# Patient Record
Sex: Female | Born: 1935 | Race: Black or African American | Hispanic: No | Marital: Single | Smoking: Never smoker
Health system: Southern US, Community
[De-identification: ages and names within clinical notes are randomized; demographics above are authoritative.]

## PROBLEM LIST (undated history)

## (undated) DIAGNOSIS — F0390 Unspecified dementia without behavioral disturbance: Secondary | ICD-10-CM

## (undated) DIAGNOSIS — T7840XA Allergy, unspecified, initial encounter: Secondary | ICD-10-CM

## (undated) DIAGNOSIS — E538 Deficiency of other specified B group vitamins: Secondary | ICD-10-CM

## (undated) DIAGNOSIS — H409 Unspecified glaucoma: Secondary | ICD-10-CM

## (undated) DIAGNOSIS — Z8489 Family history of other specified conditions: Secondary | ICD-10-CM

## (undated) DIAGNOSIS — M1990 Unspecified osteoarthritis, unspecified site: Secondary | ICD-10-CM

## (undated) DIAGNOSIS — T883XXA Malignant hyperthermia due to anesthesia, initial encounter: Secondary | ICD-10-CM

## (undated) DIAGNOSIS — I10 Essential (primary) hypertension: Secondary | ICD-10-CM

## (undated) DIAGNOSIS — Z8619 Personal history of other infectious and parasitic diseases: Secondary | ICD-10-CM

## (undated) DIAGNOSIS — Z87898 Personal history of other specified conditions: Secondary | ICD-10-CM

## (undated) DIAGNOSIS — J189 Pneumonia, unspecified organism: Secondary | ICD-10-CM

## (undated) DIAGNOSIS — K5909 Other constipation: Secondary | ICD-10-CM

## (undated) DIAGNOSIS — D649 Anemia, unspecified: Secondary | ICD-10-CM

## (undated) DIAGNOSIS — G459 Transient cerebral ischemic attack, unspecified: Secondary | ICD-10-CM

## (undated) DIAGNOSIS — H6120 Impacted cerumen, unspecified ear: Secondary | ICD-10-CM

## (undated) DIAGNOSIS — Z8781 Personal history of (healed) traumatic fracture: Secondary | ICD-10-CM

## (undated) DIAGNOSIS — B0229 Other postherpetic nervous system involvement: Secondary | ICD-10-CM

## (undated) HISTORY — DX: Personal history of other specified conditions: Z87.898

## (undated) HISTORY — DX: Allergy, unspecified, initial encounter: T78.40XA

## (undated) HISTORY — DX: Unspecified dementia, unspecified severity, without behavioral disturbance, psychotic disturbance, mood disturbance, and anxiety: F03.90

## (undated) HISTORY — DX: Impacted cerumen, unspecified ear: H61.20

## (undated) HISTORY — PX: BREAST LUMPECTOMY: SHX2

## (undated) HISTORY — DX: Personal history of (healed) traumatic fracture: Z87.81

## (undated) HISTORY — DX: Deficiency of other specified B group vitamins: E53.8

## (undated) HISTORY — DX: Unspecified osteoarthritis, unspecified site: M19.90

## (undated) HISTORY — DX: Other constipation: K59.09

## (undated) HISTORY — DX: Anemia, unspecified: D64.9

## (undated) HISTORY — DX: Essential (primary) hypertension: I10

## (undated) HISTORY — DX: Other postherpetic nervous system involvement: B02.29

## (undated) HISTORY — DX: Personal history of other infectious and parasitic diseases: Z86.19

## (undated) HISTORY — DX: Transient cerebral ischemic attack, unspecified: G45.9

## (undated) HISTORY — DX: Unspecified glaucoma: H40.9

---

## 2010-02-25 ENCOUNTER — Ambulatory Visit: Payer: Self-pay | Admitting: Internal Medicine

## 2010-05-22 ENCOUNTER — Ambulatory Visit: Payer: Self-pay | Admitting: Internal Medicine

## 2011-01-06 ENCOUNTER — Ambulatory Visit: Payer: Self-pay | Admitting: Anesthesiology

## 2011-08-22 DIAGNOSIS — I10 Essential (primary) hypertension: Secondary | ICD-10-CM | POA: Diagnosis not present

## 2011-08-22 DIAGNOSIS — M159 Polyosteoarthritis, unspecified: Secondary | ICD-10-CM | POA: Diagnosis not present

## 2011-08-22 DIAGNOSIS — D510 Vitamin B12 deficiency anemia due to intrinsic factor deficiency: Secondary | ICD-10-CM | POA: Diagnosis not present

## 2011-09-17 DIAGNOSIS — I10 Essential (primary) hypertension: Secondary | ICD-10-CM | POA: Diagnosis not present

## 2011-09-17 DIAGNOSIS — M159 Polyosteoarthritis, unspecified: Secondary | ICD-10-CM | POA: Diagnosis not present

## 2011-09-17 DIAGNOSIS — D510 Vitamin B12 deficiency anemia due to intrinsic factor deficiency: Secondary | ICD-10-CM | POA: Diagnosis not present

## 2011-09-22 DIAGNOSIS — M79609 Pain in unspecified limb: Secondary | ICD-10-CM | POA: Diagnosis not present

## 2011-09-22 DIAGNOSIS — B351 Tinea unguium: Secondary | ICD-10-CM | POA: Diagnosis not present

## 2011-10-16 DIAGNOSIS — D510 Vitamin B12 deficiency anemia due to intrinsic factor deficiency: Secondary | ICD-10-CM | POA: Diagnosis not present

## 2011-10-16 DIAGNOSIS — I10 Essential (primary) hypertension: Secondary | ICD-10-CM | POA: Diagnosis not present

## 2011-10-16 DIAGNOSIS — M159 Polyosteoarthritis, unspecified: Secondary | ICD-10-CM | POA: Diagnosis not present

## 2011-10-21 DIAGNOSIS — M159 Polyosteoarthritis, unspecified: Secondary | ICD-10-CM | POA: Diagnosis not present

## 2011-10-21 DIAGNOSIS — I10 Essential (primary) hypertension: Secondary | ICD-10-CM | POA: Diagnosis not present

## 2011-10-21 DIAGNOSIS — G589 Mononeuropathy, unspecified: Secondary | ICD-10-CM | POA: Diagnosis not present

## 2011-10-21 DIAGNOSIS — D510 Vitamin B12 deficiency anemia due to intrinsic factor deficiency: Secondary | ICD-10-CM | POA: Diagnosis not present

## 2011-11-11 DIAGNOSIS — F329 Major depressive disorder, single episode, unspecified: Secondary | ICD-10-CM | POA: Diagnosis not present

## 2011-11-11 DIAGNOSIS — I10 Essential (primary) hypertension: Secondary | ICD-10-CM | POA: Diagnosis not present

## 2011-11-11 DIAGNOSIS — M25569 Pain in unspecified knee: Secondary | ICD-10-CM | POA: Diagnosis not present

## 2011-11-11 DIAGNOSIS — M159 Polyosteoarthritis, unspecified: Secondary | ICD-10-CM | POA: Diagnosis not present

## 2011-11-11 DIAGNOSIS — F0390 Unspecified dementia without behavioral disturbance: Secondary | ICD-10-CM | POA: Diagnosis not present

## 2011-11-11 DIAGNOSIS — D518 Other vitamin B12 deficiency anemias: Secondary | ICD-10-CM | POA: Diagnosis not present

## 2011-11-12 DIAGNOSIS — G589 Mononeuropathy, unspecified: Secondary | ICD-10-CM | POA: Diagnosis not present

## 2011-11-12 DIAGNOSIS — D510 Vitamin B12 deficiency anemia due to intrinsic factor deficiency: Secondary | ICD-10-CM | POA: Diagnosis not present

## 2011-11-12 DIAGNOSIS — M159 Polyosteoarthritis, unspecified: Secondary | ICD-10-CM | POA: Diagnosis not present

## 2011-11-12 DIAGNOSIS — I10 Essential (primary) hypertension: Secondary | ICD-10-CM | POA: Diagnosis not present

## 2011-11-16 DIAGNOSIS — F0280 Dementia in other diseases classified elsewhere without behavioral disturbance: Secondary | ICD-10-CM | POA: Diagnosis not present

## 2011-11-16 DIAGNOSIS — M79609 Pain in unspecified limb: Secondary | ICD-10-CM | POA: Diagnosis not present

## 2011-11-16 DIAGNOSIS — G309 Alzheimer's disease, unspecified: Secondary | ICD-10-CM | POA: Diagnosis not present

## 2011-11-16 DIAGNOSIS — M25569 Pain in unspecified knee: Secondary | ICD-10-CM | POA: Diagnosis not present

## 2011-11-16 DIAGNOSIS — M1710 Unilateral primary osteoarthritis, unspecified knee: Secondary | ICD-10-CM | POA: Diagnosis not present

## 2011-11-16 DIAGNOSIS — F329 Major depressive disorder, single episode, unspecified: Secondary | ICD-10-CM | POA: Diagnosis not present

## 2011-12-15 DIAGNOSIS — G589 Mononeuropathy, unspecified: Secondary | ICD-10-CM | POA: Diagnosis not present

## 2011-12-15 DIAGNOSIS — D510 Vitamin B12 deficiency anemia due to intrinsic factor deficiency: Secondary | ICD-10-CM | POA: Diagnosis not present

## 2011-12-15 DIAGNOSIS — I10 Essential (primary) hypertension: Secondary | ICD-10-CM | POA: Diagnosis not present

## 2011-12-15 DIAGNOSIS — M159 Polyosteoarthritis, unspecified: Secondary | ICD-10-CM | POA: Diagnosis not present

## 2011-12-20 DIAGNOSIS — M159 Polyosteoarthritis, unspecified: Secondary | ICD-10-CM | POA: Diagnosis not present

## 2011-12-20 DIAGNOSIS — G608 Other hereditary and idiopathic neuropathies: Secondary | ICD-10-CM | POA: Diagnosis not present

## 2011-12-20 DIAGNOSIS — I10 Essential (primary) hypertension: Secondary | ICD-10-CM | POA: Diagnosis not present

## 2011-12-20 DIAGNOSIS — D510 Vitamin B12 deficiency anemia due to intrinsic factor deficiency: Secondary | ICD-10-CM | POA: Diagnosis not present

## 2011-12-20 DIAGNOSIS — R32 Unspecified urinary incontinence: Secondary | ICD-10-CM | POA: Diagnosis not present

## 2012-01-11 DIAGNOSIS — M79609 Pain in unspecified limb: Secondary | ICD-10-CM | POA: Diagnosis not present

## 2012-01-11 DIAGNOSIS — F329 Major depressive disorder, single episode, unspecified: Secondary | ICD-10-CM | POA: Diagnosis not present

## 2012-01-11 DIAGNOSIS — M1710 Unilateral primary osteoarthritis, unspecified knee: Secondary | ICD-10-CM | POA: Diagnosis not present

## 2012-01-11 DIAGNOSIS — Z79899 Other long term (current) drug therapy: Secondary | ICD-10-CM | POA: Diagnosis not present

## 2012-01-11 DIAGNOSIS — F0280 Dementia in other diseases classified elsewhere without behavioral disturbance: Secondary | ICD-10-CM | POA: Diagnosis not present

## 2012-01-11 DIAGNOSIS — M25569 Pain in unspecified knee: Secondary | ICD-10-CM | POA: Diagnosis not present

## 2012-01-15 DIAGNOSIS — D510 Vitamin B12 deficiency anemia due to intrinsic factor deficiency: Secondary | ICD-10-CM | POA: Diagnosis not present

## 2012-01-15 DIAGNOSIS — M159 Polyosteoarthritis, unspecified: Secondary | ICD-10-CM | POA: Diagnosis not present

## 2012-01-15 DIAGNOSIS — R32 Unspecified urinary incontinence: Secondary | ICD-10-CM | POA: Diagnosis not present

## 2012-01-15 DIAGNOSIS — I10 Essential (primary) hypertension: Secondary | ICD-10-CM | POA: Diagnosis not present

## 2012-01-15 DIAGNOSIS — G608 Other hereditary and idiopathic neuropathies: Secondary | ICD-10-CM | POA: Diagnosis not present

## 2012-01-28 DIAGNOSIS — H2510 Age-related nuclear cataract, unspecified eye: Secondary | ICD-10-CM | POA: Diagnosis not present

## 2012-01-28 DIAGNOSIS — H40029 Open angle with borderline findings, high risk, unspecified eye: Secondary | ICD-10-CM | POA: Diagnosis not present

## 2012-02-11 ENCOUNTER — Emergency Department: Payer: Self-pay | Admitting: Emergency Medicine

## 2012-02-11 DIAGNOSIS — S62109A Fracture of unspecified carpal bone, unspecified wrist, initial encounter for closed fracture: Secondary | ICD-10-CM | POA: Diagnosis not present

## 2012-02-11 DIAGNOSIS — S52599A Other fractures of lower end of unspecified radius, initial encounter for closed fracture: Secondary | ICD-10-CM | POA: Diagnosis not present

## 2012-02-11 DIAGNOSIS — S52509A Unspecified fracture of the lower end of unspecified radius, initial encounter for closed fracture: Secondary | ICD-10-CM | POA: Diagnosis not present

## 2012-02-11 DIAGNOSIS — S52609A Unspecified fracture of lower end of unspecified ulna, initial encounter for closed fracture: Secondary | ICD-10-CM | POA: Diagnosis not present

## 2012-02-11 DIAGNOSIS — I10 Essential (primary) hypertension: Secondary | ICD-10-CM | POA: Diagnosis not present

## 2012-02-12 DIAGNOSIS — S52539A Colles' fracture of unspecified radius, initial encounter for closed fracture: Secondary | ICD-10-CM | POA: Diagnosis not present

## 2012-02-12 LAB — CBC WITH DIFFERENTIAL/PLATELET
Basophil #: 0 10*3/uL (ref 0.0–0.1)
Eosinophil #: 0.1 10*3/uL (ref 0.0–0.7)
HCT: 32.5 % — ABNORMAL LOW (ref 35.0–47.0)
HGB: 10.2 g/dL — ABNORMAL LOW (ref 12.0–16.0)
Lymphocyte #: 2.1 10*3/uL (ref 1.0–3.6)
Lymphocyte %: 21 %
MCHC: 31.2 g/dL — ABNORMAL LOW (ref 32.0–36.0)
Monocyte #: 0.9 x10 3/mm (ref 0.2–0.9)
Monocyte %: 9.4 %
Neutrophil #: 6.7 10*3/uL — ABNORMAL HIGH (ref 1.4–6.5)
Neutrophil %: 67.7 %
Platelet: 287 10*3/uL (ref 150–440)
RBC: 3.59 10*6/uL — ABNORMAL LOW (ref 3.80–5.20)
WBC: 9.8 10*3/uL (ref 3.6–11.0)

## 2012-02-12 LAB — COMPREHENSIVE METABOLIC PANEL
Albumin: 3.9 g/dL (ref 3.4–5.0)
Alkaline Phosphatase: 58 U/L (ref 50–136)
BUN: 45 mg/dL — ABNORMAL HIGH (ref 7–18)
Bilirubin,Total: 0.3 mg/dL (ref 0.2–1.0)
Chloride: 100 mmol/L (ref 98–107)
Co2: 29 mmol/L (ref 21–32)
EGFR (African American): 47 — ABNORMAL LOW
EGFR (Non-African Amer.): 41 — ABNORMAL LOW
Osmolality: 286 (ref 275–301)
Potassium: 4 mmol/L (ref 3.5–5.1)
SGOT(AST): 28 U/L (ref 15–37)

## 2012-02-12 LAB — TROPONIN I: Troponin-I: <0.02 ng/mL

## 2012-02-12 LAB — TSH: Thyroid Stimulating Horm: 2.37 u[IU]/mL

## 2012-02-15 DIAGNOSIS — G608 Other hereditary and idiopathic neuropathies: Secondary | ICD-10-CM | POA: Diagnosis not present

## 2012-02-15 DIAGNOSIS — R32 Unspecified urinary incontinence: Secondary | ICD-10-CM | POA: Diagnosis not present

## 2012-02-15 DIAGNOSIS — I10 Essential (primary) hypertension: Secondary | ICD-10-CM | POA: Diagnosis not present

## 2012-02-15 DIAGNOSIS — D510 Vitamin B12 deficiency anemia due to intrinsic factor deficiency: Secondary | ICD-10-CM | POA: Diagnosis not present

## 2012-02-15 DIAGNOSIS — M159 Polyosteoarthritis, unspecified: Secondary | ICD-10-CM | POA: Diagnosis not present

## 2012-02-18 DIAGNOSIS — I10 Essential (primary) hypertension: Secondary | ICD-10-CM | POA: Diagnosis not present

## 2012-02-18 DIAGNOSIS — M6281 Muscle weakness (generalized): Secondary | ICD-10-CM | POA: Diagnosis not present

## 2012-02-18 DIAGNOSIS — R32 Unspecified urinary incontinence: Secondary | ICD-10-CM | POA: Diagnosis not present

## 2012-02-18 DIAGNOSIS — R269 Unspecified abnormalities of gait and mobility: Secondary | ICD-10-CM | POA: Diagnosis not present

## 2012-02-18 DIAGNOSIS — F0390 Unspecified dementia without behavioral disturbance: Secondary | ICD-10-CM | POA: Diagnosis not present

## 2012-02-18 DIAGNOSIS — Z9181 History of falling: Secondary | ICD-10-CM | POA: Diagnosis not present

## 2012-02-18 DIAGNOSIS — M159 Polyosteoarthritis, unspecified: Secondary | ICD-10-CM | POA: Diagnosis not present

## 2012-02-18 DIAGNOSIS — S5290XD Unspecified fracture of unspecified forearm, subsequent encounter for closed fracture with routine healing: Secondary | ICD-10-CM | POA: Diagnosis not present

## 2012-02-18 DIAGNOSIS — R262 Difficulty in walking, not elsewhere classified: Secondary | ICD-10-CM | POA: Diagnosis not present

## 2012-02-18 DIAGNOSIS — D510 Vitamin B12 deficiency anemia due to intrinsic factor deficiency: Secondary | ICD-10-CM | POA: Diagnosis not present

## 2012-02-18 DIAGNOSIS — G608 Other hereditary and idiopathic neuropathies: Secondary | ICD-10-CM | POA: Diagnosis not present

## 2012-02-19 DIAGNOSIS — I10 Essential (primary) hypertension: Secondary | ICD-10-CM | POA: Diagnosis not present

## 2012-02-19 DIAGNOSIS — S5290XA Unspecified fracture of unspecified forearm, initial encounter for closed fracture: Secondary | ICD-10-CM | POA: Diagnosis not present

## 2012-02-19 DIAGNOSIS — M1710 Unilateral primary osteoarthritis, unspecified knee: Secondary | ICD-10-CM | POA: Diagnosis not present

## 2012-02-19 DIAGNOSIS — F0390 Unspecified dementia without behavioral disturbance: Secondary | ICD-10-CM | POA: Diagnosis not present

## 2012-02-22 DIAGNOSIS — M159 Polyosteoarthritis, unspecified: Secondary | ICD-10-CM | POA: Diagnosis not present

## 2012-02-22 DIAGNOSIS — I10 Essential (primary) hypertension: Secondary | ICD-10-CM | POA: Diagnosis not present

## 2012-02-22 DIAGNOSIS — F0390 Unspecified dementia without behavioral disturbance: Secondary | ICD-10-CM | POA: Diagnosis not present

## 2012-02-22 DIAGNOSIS — S5290XD Unspecified fracture of unspecified forearm, subsequent encounter for closed fracture with routine healing: Secondary | ICD-10-CM | POA: Diagnosis not present

## 2012-02-22 DIAGNOSIS — D510 Vitamin B12 deficiency anemia due to intrinsic factor deficiency: Secondary | ICD-10-CM | POA: Diagnosis not present

## 2012-02-22 DIAGNOSIS — R262 Difficulty in walking, not elsewhere classified: Secondary | ICD-10-CM | POA: Diagnosis not present

## 2012-02-24 DIAGNOSIS — I10 Essential (primary) hypertension: Secondary | ICD-10-CM | POA: Diagnosis not present

## 2012-02-24 DIAGNOSIS — S5290XD Unspecified fracture of unspecified forearm, subsequent encounter for closed fracture with routine healing: Secondary | ICD-10-CM | POA: Diagnosis not present

## 2012-02-24 DIAGNOSIS — F0390 Unspecified dementia without behavioral disturbance: Secondary | ICD-10-CM | POA: Diagnosis not present

## 2012-02-24 DIAGNOSIS — R262 Difficulty in walking, not elsewhere classified: Secondary | ICD-10-CM | POA: Diagnosis not present

## 2012-02-24 DIAGNOSIS — D510 Vitamin B12 deficiency anemia due to intrinsic factor deficiency: Secondary | ICD-10-CM | POA: Diagnosis not present

## 2012-02-24 DIAGNOSIS — M159 Polyosteoarthritis, unspecified: Secondary | ICD-10-CM | POA: Diagnosis not present

## 2012-02-25 DIAGNOSIS — I10 Essential (primary) hypertension: Secondary | ICD-10-CM | POA: Diagnosis not present

## 2012-02-25 DIAGNOSIS — D510 Vitamin B12 deficiency anemia due to intrinsic factor deficiency: Secondary | ICD-10-CM | POA: Diagnosis not present

## 2012-02-25 DIAGNOSIS — M159 Polyosteoarthritis, unspecified: Secondary | ICD-10-CM | POA: Diagnosis not present

## 2012-02-25 DIAGNOSIS — S5290XD Unspecified fracture of unspecified forearm, subsequent encounter for closed fracture with routine healing: Secondary | ICD-10-CM | POA: Diagnosis not present

## 2012-02-25 DIAGNOSIS — F0390 Unspecified dementia without behavioral disturbance: Secondary | ICD-10-CM | POA: Diagnosis not present

## 2012-02-25 DIAGNOSIS — R262 Difficulty in walking, not elsewhere classified: Secondary | ICD-10-CM | POA: Diagnosis not present

## 2012-02-29 DIAGNOSIS — I10 Essential (primary) hypertension: Secondary | ICD-10-CM | POA: Diagnosis not present

## 2012-02-29 DIAGNOSIS — D510 Vitamin B12 deficiency anemia due to intrinsic factor deficiency: Secondary | ICD-10-CM | POA: Diagnosis not present

## 2012-02-29 DIAGNOSIS — S5290XD Unspecified fracture of unspecified forearm, subsequent encounter for closed fracture with routine healing: Secondary | ICD-10-CM | POA: Diagnosis not present

## 2012-02-29 DIAGNOSIS — M159 Polyosteoarthritis, unspecified: Secondary | ICD-10-CM | POA: Diagnosis not present

## 2012-02-29 DIAGNOSIS — F0390 Unspecified dementia without behavioral disturbance: Secondary | ICD-10-CM | POA: Diagnosis not present

## 2012-02-29 DIAGNOSIS — R262 Difficulty in walking, not elsewhere classified: Secondary | ICD-10-CM | POA: Diagnosis not present

## 2012-03-01 DIAGNOSIS — S52539A Colles' fracture of unspecified radius, initial encounter for closed fracture: Secondary | ICD-10-CM | POA: Diagnosis not present

## 2012-03-01 DIAGNOSIS — M79609 Pain in unspecified limb: Secondary | ICD-10-CM | POA: Diagnosis not present

## 2012-03-01 DIAGNOSIS — B351 Tinea unguium: Secondary | ICD-10-CM | POA: Diagnosis not present

## 2012-03-02 DIAGNOSIS — M159 Polyosteoarthritis, unspecified: Secondary | ICD-10-CM | POA: Diagnosis not present

## 2012-03-02 DIAGNOSIS — D510 Vitamin B12 deficiency anemia due to intrinsic factor deficiency: Secondary | ICD-10-CM | POA: Diagnosis not present

## 2012-03-02 DIAGNOSIS — S5290XD Unspecified fracture of unspecified forearm, subsequent encounter for closed fracture with routine healing: Secondary | ICD-10-CM | POA: Diagnosis not present

## 2012-03-02 DIAGNOSIS — I10 Essential (primary) hypertension: Secondary | ICD-10-CM | POA: Diagnosis not present

## 2012-03-02 DIAGNOSIS — F0390 Unspecified dementia without behavioral disturbance: Secondary | ICD-10-CM | POA: Diagnosis not present

## 2012-03-02 DIAGNOSIS — R262 Difficulty in walking, not elsewhere classified: Secondary | ICD-10-CM | POA: Diagnosis not present

## 2012-03-04 DIAGNOSIS — D510 Vitamin B12 deficiency anemia due to intrinsic factor deficiency: Secondary | ICD-10-CM | POA: Diagnosis not present

## 2012-03-04 DIAGNOSIS — I10 Essential (primary) hypertension: Secondary | ICD-10-CM | POA: Diagnosis not present

## 2012-03-04 DIAGNOSIS — F0390 Unspecified dementia without behavioral disturbance: Secondary | ICD-10-CM | POA: Diagnosis not present

## 2012-03-04 DIAGNOSIS — R262 Difficulty in walking, not elsewhere classified: Secondary | ICD-10-CM | POA: Diagnosis not present

## 2012-03-04 DIAGNOSIS — M159 Polyosteoarthritis, unspecified: Secondary | ICD-10-CM | POA: Diagnosis not present

## 2012-03-04 DIAGNOSIS — S5290XD Unspecified fracture of unspecified forearm, subsequent encounter for closed fracture with routine healing: Secondary | ICD-10-CM | POA: Diagnosis not present

## 2012-03-07 DIAGNOSIS — M159 Polyosteoarthritis, unspecified: Secondary | ICD-10-CM | POA: Diagnosis not present

## 2012-03-07 DIAGNOSIS — F0390 Unspecified dementia without behavioral disturbance: Secondary | ICD-10-CM | POA: Diagnosis not present

## 2012-03-07 DIAGNOSIS — R262 Difficulty in walking, not elsewhere classified: Secondary | ICD-10-CM | POA: Diagnosis not present

## 2012-03-07 DIAGNOSIS — S5290XD Unspecified fracture of unspecified forearm, subsequent encounter for closed fracture with routine healing: Secondary | ICD-10-CM | POA: Diagnosis not present

## 2012-03-07 DIAGNOSIS — D510 Vitamin B12 deficiency anemia due to intrinsic factor deficiency: Secondary | ICD-10-CM | POA: Diagnosis not present

## 2012-03-07 DIAGNOSIS — I10 Essential (primary) hypertension: Secondary | ICD-10-CM | POA: Diagnosis not present

## 2012-03-08 DIAGNOSIS — H40029 Open angle with borderline findings, high risk, unspecified eye: Secondary | ICD-10-CM | POA: Diagnosis not present

## 2012-03-09 DIAGNOSIS — I10 Essential (primary) hypertension: Secondary | ICD-10-CM | POA: Diagnosis not present

## 2012-03-09 DIAGNOSIS — F0390 Unspecified dementia without behavioral disturbance: Secondary | ICD-10-CM | POA: Diagnosis not present

## 2012-03-09 DIAGNOSIS — D510 Vitamin B12 deficiency anemia due to intrinsic factor deficiency: Secondary | ICD-10-CM | POA: Diagnosis not present

## 2012-03-09 DIAGNOSIS — S5290XD Unspecified fracture of unspecified forearm, subsequent encounter for closed fracture with routine healing: Secondary | ICD-10-CM | POA: Diagnosis not present

## 2012-03-09 DIAGNOSIS — M159 Polyosteoarthritis, unspecified: Secondary | ICD-10-CM | POA: Diagnosis not present

## 2012-03-09 DIAGNOSIS — R262 Difficulty in walking, not elsewhere classified: Secondary | ICD-10-CM | POA: Diagnosis not present

## 2012-03-10 DIAGNOSIS — M159 Polyosteoarthritis, unspecified: Secondary | ICD-10-CM | POA: Diagnosis not present

## 2012-03-10 DIAGNOSIS — F0390 Unspecified dementia without behavioral disturbance: Secondary | ICD-10-CM | POA: Diagnosis not present

## 2012-03-10 DIAGNOSIS — D510 Vitamin B12 deficiency anemia due to intrinsic factor deficiency: Secondary | ICD-10-CM | POA: Diagnosis not present

## 2012-03-10 DIAGNOSIS — I10 Essential (primary) hypertension: Secondary | ICD-10-CM | POA: Diagnosis not present

## 2012-03-10 DIAGNOSIS — S5290XD Unspecified fracture of unspecified forearm, subsequent encounter for closed fracture with routine healing: Secondary | ICD-10-CM | POA: Diagnosis not present

## 2012-03-10 DIAGNOSIS — R262 Difficulty in walking, not elsewhere classified: Secondary | ICD-10-CM | POA: Diagnosis not present

## 2012-03-14 DIAGNOSIS — F0390 Unspecified dementia without behavioral disturbance: Secondary | ICD-10-CM | POA: Diagnosis not present

## 2012-03-14 DIAGNOSIS — D510 Vitamin B12 deficiency anemia due to intrinsic factor deficiency: Secondary | ICD-10-CM | POA: Diagnosis not present

## 2012-03-14 DIAGNOSIS — S5290XD Unspecified fracture of unspecified forearm, subsequent encounter for closed fracture with routine healing: Secondary | ICD-10-CM | POA: Diagnosis not present

## 2012-03-14 DIAGNOSIS — M159 Polyosteoarthritis, unspecified: Secondary | ICD-10-CM | POA: Diagnosis not present

## 2012-03-14 DIAGNOSIS — I10 Essential (primary) hypertension: Secondary | ICD-10-CM | POA: Diagnosis not present

## 2012-03-14 DIAGNOSIS — R262 Difficulty in walking, not elsewhere classified: Secondary | ICD-10-CM | POA: Diagnosis not present

## 2012-03-15 DIAGNOSIS — S5290XD Unspecified fracture of unspecified forearm, subsequent encounter for closed fracture with routine healing: Secondary | ICD-10-CM | POA: Diagnosis not present

## 2012-03-15 DIAGNOSIS — R262 Difficulty in walking, not elsewhere classified: Secondary | ICD-10-CM | POA: Diagnosis not present

## 2012-03-15 DIAGNOSIS — M159 Polyosteoarthritis, unspecified: Secondary | ICD-10-CM | POA: Diagnosis not present

## 2012-03-15 DIAGNOSIS — D510 Vitamin B12 deficiency anemia due to intrinsic factor deficiency: Secondary | ICD-10-CM | POA: Diagnosis not present

## 2012-03-15 DIAGNOSIS — F0390 Unspecified dementia without behavioral disturbance: Secondary | ICD-10-CM | POA: Diagnosis not present

## 2012-03-15 DIAGNOSIS — I10 Essential (primary) hypertension: Secondary | ICD-10-CM | POA: Diagnosis not present

## 2012-03-17 DIAGNOSIS — M159 Polyosteoarthritis, unspecified: Secondary | ICD-10-CM | POA: Diagnosis not present

## 2012-03-17 DIAGNOSIS — R262 Difficulty in walking, not elsewhere classified: Secondary | ICD-10-CM | POA: Diagnosis not present

## 2012-03-17 DIAGNOSIS — F0390 Unspecified dementia without behavioral disturbance: Secondary | ICD-10-CM | POA: Diagnosis not present

## 2012-03-17 DIAGNOSIS — I10 Essential (primary) hypertension: Secondary | ICD-10-CM | POA: Diagnosis not present

## 2012-03-17 DIAGNOSIS — S5290XD Unspecified fracture of unspecified forearm, subsequent encounter for closed fracture with routine healing: Secondary | ICD-10-CM | POA: Diagnosis not present

## 2012-03-17 DIAGNOSIS — D510 Vitamin B12 deficiency anemia due to intrinsic factor deficiency: Secondary | ICD-10-CM | POA: Diagnosis not present

## 2012-03-22 DIAGNOSIS — R262 Difficulty in walking, not elsewhere classified: Secondary | ICD-10-CM | POA: Diagnosis not present

## 2012-03-22 DIAGNOSIS — D510 Vitamin B12 deficiency anemia due to intrinsic factor deficiency: Secondary | ICD-10-CM | POA: Diagnosis not present

## 2012-03-22 DIAGNOSIS — I10 Essential (primary) hypertension: Secondary | ICD-10-CM | POA: Diagnosis not present

## 2012-03-22 DIAGNOSIS — F0390 Unspecified dementia without behavioral disturbance: Secondary | ICD-10-CM | POA: Diagnosis not present

## 2012-03-22 DIAGNOSIS — M159 Polyosteoarthritis, unspecified: Secondary | ICD-10-CM | POA: Diagnosis not present

## 2012-03-22 DIAGNOSIS — S5290XD Unspecified fracture of unspecified forearm, subsequent encounter for closed fracture with routine healing: Secondary | ICD-10-CM | POA: Diagnosis not present

## 2012-03-24 DIAGNOSIS — S52539A Colles' fracture of unspecified radius, initial encounter for closed fracture: Secondary | ICD-10-CM | POA: Diagnosis not present

## 2012-03-24 DIAGNOSIS — M159 Polyosteoarthritis, unspecified: Secondary | ICD-10-CM | POA: Diagnosis not present

## 2012-03-24 DIAGNOSIS — S5290XD Unspecified fracture of unspecified forearm, subsequent encounter for closed fracture with routine healing: Secondary | ICD-10-CM | POA: Diagnosis not present

## 2012-03-24 DIAGNOSIS — I10 Essential (primary) hypertension: Secondary | ICD-10-CM | POA: Diagnosis not present

## 2012-03-24 DIAGNOSIS — D510 Vitamin B12 deficiency anemia due to intrinsic factor deficiency: Secondary | ICD-10-CM | POA: Diagnosis not present

## 2012-03-24 DIAGNOSIS — R262 Difficulty in walking, not elsewhere classified: Secondary | ICD-10-CM | POA: Diagnosis not present

## 2012-03-24 DIAGNOSIS — F0390 Unspecified dementia without behavioral disturbance: Secondary | ICD-10-CM | POA: Diagnosis not present

## 2012-03-28 DIAGNOSIS — Z79899 Other long term (current) drug therapy: Secondary | ICD-10-CM | POA: Diagnosis not present

## 2012-03-28 DIAGNOSIS — F0280 Dementia in other diseases classified elsewhere without behavioral disturbance: Secondary | ICD-10-CM | POA: Diagnosis not present

## 2012-03-28 DIAGNOSIS — M25569 Pain in unspecified knee: Secondary | ICD-10-CM | POA: Diagnosis not present

## 2012-03-28 DIAGNOSIS — G309 Alzheimer's disease, unspecified: Secondary | ICD-10-CM | POA: Diagnosis not present

## 2012-03-28 DIAGNOSIS — F329 Major depressive disorder, single episode, unspecified: Secondary | ICD-10-CM | POA: Diagnosis not present

## 2012-03-28 DIAGNOSIS — M79609 Pain in unspecified limb: Secondary | ICD-10-CM | POA: Diagnosis not present

## 2012-03-28 DIAGNOSIS — M1710 Unilateral primary osteoarthritis, unspecified knee: Secondary | ICD-10-CM | POA: Diagnosis not present

## 2012-03-30 DIAGNOSIS — M159 Polyosteoarthritis, unspecified: Secondary | ICD-10-CM | POA: Diagnosis not present

## 2012-03-30 DIAGNOSIS — R262 Difficulty in walking, not elsewhere classified: Secondary | ICD-10-CM | POA: Diagnosis not present

## 2012-03-30 DIAGNOSIS — S5290XD Unspecified fracture of unspecified forearm, subsequent encounter for closed fracture with routine healing: Secondary | ICD-10-CM | POA: Diagnosis not present

## 2012-03-30 DIAGNOSIS — F0390 Unspecified dementia without behavioral disturbance: Secondary | ICD-10-CM | POA: Diagnosis not present

## 2012-03-30 DIAGNOSIS — I10 Essential (primary) hypertension: Secondary | ICD-10-CM | POA: Diagnosis not present

## 2012-03-30 DIAGNOSIS — D510 Vitamin B12 deficiency anemia due to intrinsic factor deficiency: Secondary | ICD-10-CM | POA: Diagnosis not present

## 2012-04-01 DIAGNOSIS — S5290XD Unspecified fracture of unspecified forearm, subsequent encounter for closed fracture with routine healing: Secondary | ICD-10-CM | POA: Diagnosis not present

## 2012-04-01 DIAGNOSIS — D510 Vitamin B12 deficiency anemia due to intrinsic factor deficiency: Secondary | ICD-10-CM | POA: Diagnosis not present

## 2012-04-01 DIAGNOSIS — M159 Polyosteoarthritis, unspecified: Secondary | ICD-10-CM | POA: Diagnosis not present

## 2012-04-01 DIAGNOSIS — R262 Difficulty in walking, not elsewhere classified: Secondary | ICD-10-CM | POA: Diagnosis not present

## 2012-04-01 DIAGNOSIS — F0390 Unspecified dementia without behavioral disturbance: Secondary | ICD-10-CM | POA: Diagnosis not present

## 2012-04-01 DIAGNOSIS — I10 Essential (primary) hypertension: Secondary | ICD-10-CM | POA: Diagnosis not present

## 2012-04-04 DIAGNOSIS — I10 Essential (primary) hypertension: Secondary | ICD-10-CM | POA: Diagnosis not present

## 2012-04-04 DIAGNOSIS — M159 Polyosteoarthritis, unspecified: Secondary | ICD-10-CM | POA: Diagnosis not present

## 2012-04-04 DIAGNOSIS — F0390 Unspecified dementia without behavioral disturbance: Secondary | ICD-10-CM | POA: Diagnosis not present

## 2012-04-04 DIAGNOSIS — D510 Vitamin B12 deficiency anemia due to intrinsic factor deficiency: Secondary | ICD-10-CM | POA: Diagnosis not present

## 2012-04-04 DIAGNOSIS — R262 Difficulty in walking, not elsewhere classified: Secondary | ICD-10-CM | POA: Diagnosis not present

## 2012-04-04 DIAGNOSIS — S5290XD Unspecified fracture of unspecified forearm, subsequent encounter for closed fracture with routine healing: Secondary | ICD-10-CM | POA: Diagnosis not present

## 2012-04-06 DIAGNOSIS — F0390 Unspecified dementia without behavioral disturbance: Secondary | ICD-10-CM | POA: Diagnosis not present

## 2012-04-06 DIAGNOSIS — M159 Polyosteoarthritis, unspecified: Secondary | ICD-10-CM | POA: Diagnosis not present

## 2012-04-06 DIAGNOSIS — R262 Difficulty in walking, not elsewhere classified: Secondary | ICD-10-CM | POA: Diagnosis not present

## 2012-04-06 DIAGNOSIS — S5290XD Unspecified fracture of unspecified forearm, subsequent encounter for closed fracture with routine healing: Secondary | ICD-10-CM | POA: Diagnosis not present

## 2012-04-06 DIAGNOSIS — I10 Essential (primary) hypertension: Secondary | ICD-10-CM | POA: Diagnosis not present

## 2012-04-06 DIAGNOSIS — D510 Vitamin B12 deficiency anemia due to intrinsic factor deficiency: Secondary | ICD-10-CM | POA: Diagnosis not present

## 2012-04-11 DIAGNOSIS — R262 Difficulty in walking, not elsewhere classified: Secondary | ICD-10-CM | POA: Diagnosis not present

## 2012-04-11 DIAGNOSIS — S5290XD Unspecified fracture of unspecified forearm, subsequent encounter for closed fracture with routine healing: Secondary | ICD-10-CM | POA: Diagnosis not present

## 2012-04-11 DIAGNOSIS — D510 Vitamin B12 deficiency anemia due to intrinsic factor deficiency: Secondary | ICD-10-CM | POA: Diagnosis not present

## 2012-04-11 DIAGNOSIS — I10 Essential (primary) hypertension: Secondary | ICD-10-CM | POA: Diagnosis not present

## 2012-04-11 DIAGNOSIS — M159 Polyosteoarthritis, unspecified: Secondary | ICD-10-CM | POA: Diagnosis not present

## 2012-04-11 DIAGNOSIS — F0390 Unspecified dementia without behavioral disturbance: Secondary | ICD-10-CM | POA: Diagnosis not present

## 2012-04-13 DIAGNOSIS — I10 Essential (primary) hypertension: Secondary | ICD-10-CM | POA: Diagnosis not present

## 2012-04-13 DIAGNOSIS — R262 Difficulty in walking, not elsewhere classified: Secondary | ICD-10-CM | POA: Diagnosis not present

## 2012-04-13 DIAGNOSIS — D510 Vitamin B12 deficiency anemia due to intrinsic factor deficiency: Secondary | ICD-10-CM | POA: Diagnosis not present

## 2012-04-13 DIAGNOSIS — S5290XD Unspecified fracture of unspecified forearm, subsequent encounter for closed fracture with routine healing: Secondary | ICD-10-CM | POA: Diagnosis not present

## 2012-04-13 DIAGNOSIS — M159 Polyosteoarthritis, unspecified: Secondary | ICD-10-CM | POA: Diagnosis not present

## 2012-04-13 DIAGNOSIS — F0390 Unspecified dementia without behavioral disturbance: Secondary | ICD-10-CM | POA: Diagnosis not present

## 2012-04-18 DIAGNOSIS — Z9181 History of falling: Secondary | ICD-10-CM | POA: Diagnosis not present

## 2012-04-18 DIAGNOSIS — R32 Unspecified urinary incontinence: Secondary | ICD-10-CM | POA: Diagnosis not present

## 2012-04-18 DIAGNOSIS — I10 Essential (primary) hypertension: Secondary | ICD-10-CM | POA: Diagnosis not present

## 2012-04-18 DIAGNOSIS — Z8781 Personal history of (healed) traumatic fracture: Secondary | ICD-10-CM | POA: Diagnosis not present

## 2012-04-18 DIAGNOSIS — M159 Polyosteoarthritis, unspecified: Secondary | ICD-10-CM | POA: Diagnosis not present

## 2012-04-18 DIAGNOSIS — D510 Vitamin B12 deficiency anemia due to intrinsic factor deficiency: Secondary | ICD-10-CM | POA: Diagnosis not present

## 2012-04-18 DIAGNOSIS — G608 Other hereditary and idiopathic neuropathies: Secondary | ICD-10-CM | POA: Diagnosis not present

## 2012-04-20 DIAGNOSIS — Z8781 Personal history of (healed) traumatic fracture: Secondary | ICD-10-CM | POA: Diagnosis not present

## 2012-04-20 DIAGNOSIS — M159 Polyosteoarthritis, unspecified: Secondary | ICD-10-CM | POA: Diagnosis not present

## 2012-04-20 DIAGNOSIS — R32 Unspecified urinary incontinence: Secondary | ICD-10-CM | POA: Diagnosis not present

## 2012-04-20 DIAGNOSIS — G608 Other hereditary and idiopathic neuropathies: Secondary | ICD-10-CM | POA: Diagnosis not present

## 2012-04-20 DIAGNOSIS — I10 Essential (primary) hypertension: Secondary | ICD-10-CM | POA: Diagnosis not present

## 2012-04-20 DIAGNOSIS — D510 Vitamin B12 deficiency anemia due to intrinsic factor deficiency: Secondary | ICD-10-CM | POA: Diagnosis not present

## 2012-04-25 DIAGNOSIS — R32 Unspecified urinary incontinence: Secondary | ICD-10-CM | POA: Diagnosis not present

## 2012-04-25 DIAGNOSIS — I10 Essential (primary) hypertension: Secondary | ICD-10-CM | POA: Diagnosis not present

## 2012-04-25 DIAGNOSIS — G608 Other hereditary and idiopathic neuropathies: Secondary | ICD-10-CM | POA: Diagnosis not present

## 2012-04-25 DIAGNOSIS — D510 Vitamin B12 deficiency anemia due to intrinsic factor deficiency: Secondary | ICD-10-CM | POA: Diagnosis not present

## 2012-04-25 DIAGNOSIS — Z8781 Personal history of (healed) traumatic fracture: Secondary | ICD-10-CM | POA: Diagnosis not present

## 2012-04-25 DIAGNOSIS — M159 Polyosteoarthritis, unspecified: Secondary | ICD-10-CM | POA: Diagnosis not present

## 2012-04-27 DIAGNOSIS — M159 Polyosteoarthritis, unspecified: Secondary | ICD-10-CM | POA: Diagnosis not present

## 2012-04-27 DIAGNOSIS — I10 Essential (primary) hypertension: Secondary | ICD-10-CM | POA: Diagnosis not present

## 2012-04-27 DIAGNOSIS — G608 Other hereditary and idiopathic neuropathies: Secondary | ICD-10-CM | POA: Diagnosis not present

## 2012-04-27 DIAGNOSIS — R32 Unspecified urinary incontinence: Secondary | ICD-10-CM | POA: Diagnosis not present

## 2012-04-27 DIAGNOSIS — D510 Vitamin B12 deficiency anemia due to intrinsic factor deficiency: Secondary | ICD-10-CM | POA: Diagnosis not present

## 2012-04-27 DIAGNOSIS — Z8781 Personal history of (healed) traumatic fracture: Secondary | ICD-10-CM | POA: Diagnosis not present

## 2012-04-30 DIAGNOSIS — G608 Other hereditary and idiopathic neuropathies: Secondary | ICD-10-CM | POA: Diagnosis not present

## 2012-04-30 DIAGNOSIS — R32 Unspecified urinary incontinence: Secondary | ICD-10-CM | POA: Diagnosis not present

## 2012-04-30 DIAGNOSIS — I10 Essential (primary) hypertension: Secondary | ICD-10-CM | POA: Diagnosis not present

## 2012-04-30 DIAGNOSIS — D510 Vitamin B12 deficiency anemia due to intrinsic factor deficiency: Secondary | ICD-10-CM | POA: Diagnosis not present

## 2012-04-30 DIAGNOSIS — Z8781 Personal history of (healed) traumatic fracture: Secondary | ICD-10-CM | POA: Diagnosis not present

## 2012-04-30 DIAGNOSIS — M159 Polyosteoarthritis, unspecified: Secondary | ICD-10-CM | POA: Diagnosis not present

## 2012-05-03 DIAGNOSIS — M159 Polyosteoarthritis, unspecified: Secondary | ICD-10-CM | POA: Diagnosis not present

## 2012-05-03 DIAGNOSIS — G608 Other hereditary and idiopathic neuropathies: Secondary | ICD-10-CM | POA: Diagnosis not present

## 2012-05-03 DIAGNOSIS — Z8781 Personal history of (healed) traumatic fracture: Secondary | ICD-10-CM | POA: Diagnosis not present

## 2012-05-03 DIAGNOSIS — R32 Unspecified urinary incontinence: Secondary | ICD-10-CM | POA: Diagnosis not present

## 2012-05-03 DIAGNOSIS — I10 Essential (primary) hypertension: Secondary | ICD-10-CM | POA: Diagnosis not present

## 2012-05-03 DIAGNOSIS — D510 Vitamin B12 deficiency anemia due to intrinsic factor deficiency: Secondary | ICD-10-CM | POA: Diagnosis not present

## 2012-05-04 DIAGNOSIS — M159 Polyosteoarthritis, unspecified: Secondary | ICD-10-CM | POA: Diagnosis not present

## 2012-05-04 DIAGNOSIS — G608 Other hereditary and idiopathic neuropathies: Secondary | ICD-10-CM | POA: Diagnosis not present

## 2012-05-04 DIAGNOSIS — I10 Essential (primary) hypertension: Secondary | ICD-10-CM | POA: Diagnosis not present

## 2012-05-04 DIAGNOSIS — R32 Unspecified urinary incontinence: Secondary | ICD-10-CM | POA: Diagnosis not present

## 2012-05-04 DIAGNOSIS — Z8781 Personal history of (healed) traumatic fracture: Secondary | ICD-10-CM | POA: Diagnosis not present

## 2012-05-04 DIAGNOSIS — D510 Vitamin B12 deficiency anemia due to intrinsic factor deficiency: Secondary | ICD-10-CM | POA: Diagnosis not present

## 2012-05-09 DIAGNOSIS — I10 Essential (primary) hypertension: Secondary | ICD-10-CM | POA: Diagnosis not present

## 2012-05-09 DIAGNOSIS — D510 Vitamin B12 deficiency anemia due to intrinsic factor deficiency: Secondary | ICD-10-CM | POA: Diagnosis not present

## 2012-05-09 DIAGNOSIS — G608 Other hereditary and idiopathic neuropathies: Secondary | ICD-10-CM | POA: Diagnosis not present

## 2012-05-09 DIAGNOSIS — M159 Polyosteoarthritis, unspecified: Secondary | ICD-10-CM | POA: Diagnosis not present

## 2012-05-23 DIAGNOSIS — M159 Polyosteoarthritis, unspecified: Secondary | ICD-10-CM | POA: Diagnosis not present

## 2012-05-23 DIAGNOSIS — I10 Essential (primary) hypertension: Secondary | ICD-10-CM | POA: Diagnosis not present

## 2012-05-23 DIAGNOSIS — R32 Unspecified urinary incontinence: Secondary | ICD-10-CM | POA: Diagnosis not present

## 2012-05-23 DIAGNOSIS — F0390 Unspecified dementia without behavioral disturbance: Secondary | ICD-10-CM | POA: Diagnosis not present

## 2012-05-23 DIAGNOSIS — N390 Urinary tract infection, site not specified: Secondary | ICD-10-CM | POA: Diagnosis not present

## 2012-05-25 DIAGNOSIS — G608 Other hereditary and idiopathic neuropathies: Secondary | ICD-10-CM | POA: Diagnosis not present

## 2012-05-25 DIAGNOSIS — Z8781 Personal history of (healed) traumatic fracture: Secondary | ICD-10-CM | POA: Diagnosis not present

## 2012-05-25 DIAGNOSIS — I10 Essential (primary) hypertension: Secondary | ICD-10-CM | POA: Diagnosis not present

## 2012-05-25 DIAGNOSIS — M159 Polyosteoarthritis, unspecified: Secondary | ICD-10-CM | POA: Diagnosis not present

## 2012-05-25 DIAGNOSIS — R32 Unspecified urinary incontinence: Secondary | ICD-10-CM | POA: Diagnosis not present

## 2012-05-25 DIAGNOSIS — D510 Vitamin B12 deficiency anemia due to intrinsic factor deficiency: Secondary | ICD-10-CM | POA: Diagnosis not present

## 2012-05-27 DIAGNOSIS — Z23 Encounter for immunization: Secondary | ICD-10-CM | POA: Diagnosis not present

## 2012-06-12 DIAGNOSIS — M159 Polyosteoarthritis, unspecified: Secondary | ICD-10-CM | POA: Diagnosis not present

## 2012-06-12 DIAGNOSIS — R32 Unspecified urinary incontinence: Secondary | ICD-10-CM | POA: Diagnosis not present

## 2012-06-12 DIAGNOSIS — D510 Vitamin B12 deficiency anemia due to intrinsic factor deficiency: Secondary | ICD-10-CM | POA: Diagnosis not present

## 2012-06-12 DIAGNOSIS — I10 Essential (primary) hypertension: Secondary | ICD-10-CM | POA: Diagnosis not present

## 2012-06-12 DIAGNOSIS — Z8781 Personal history of (healed) traumatic fracture: Secondary | ICD-10-CM | POA: Diagnosis not present

## 2012-06-12 DIAGNOSIS — G608 Other hereditary and idiopathic neuropathies: Secondary | ICD-10-CM | POA: Diagnosis not present

## 2012-06-17 DIAGNOSIS — M159 Polyosteoarthritis, unspecified: Secondary | ICD-10-CM | POA: Diagnosis not present

## 2012-06-17 DIAGNOSIS — Z9181 History of falling: Secondary | ICD-10-CM | POA: Diagnosis not present

## 2012-06-17 DIAGNOSIS — G608 Other hereditary and idiopathic neuropathies: Secondary | ICD-10-CM | POA: Diagnosis not present

## 2012-06-17 DIAGNOSIS — H409 Unspecified glaucoma: Secondary | ICD-10-CM | POA: Diagnosis not present

## 2012-06-17 DIAGNOSIS — I10 Essential (primary) hypertension: Secondary | ICD-10-CM | POA: Diagnosis not present

## 2012-06-17 DIAGNOSIS — R32 Unspecified urinary incontinence: Secondary | ICD-10-CM | POA: Diagnosis not present

## 2012-06-17 DIAGNOSIS — Z8781 Personal history of (healed) traumatic fracture: Secondary | ICD-10-CM | POA: Diagnosis not present

## 2012-06-17 DIAGNOSIS — D510 Vitamin B12 deficiency anemia due to intrinsic factor deficiency: Secondary | ICD-10-CM | POA: Diagnosis not present

## 2012-06-20 DIAGNOSIS — F329 Major depressive disorder, single episode, unspecified: Secondary | ICD-10-CM | POA: Diagnosis not present

## 2012-06-20 DIAGNOSIS — M25569 Pain in unspecified knee: Secondary | ICD-10-CM | POA: Diagnosis not present

## 2012-06-20 DIAGNOSIS — Z79899 Other long term (current) drug therapy: Secondary | ICD-10-CM | POA: Diagnosis not present

## 2012-06-20 DIAGNOSIS — F0280 Dementia in other diseases classified elsewhere without behavioral disturbance: Secondary | ICD-10-CM | POA: Diagnosis not present

## 2012-06-20 DIAGNOSIS — M1710 Unilateral primary osteoarthritis, unspecified knee: Secondary | ICD-10-CM | POA: Diagnosis not present

## 2012-06-20 DIAGNOSIS — G309 Alzheimer's disease, unspecified: Secondary | ICD-10-CM | POA: Diagnosis not present

## 2012-06-20 DIAGNOSIS — M79609 Pain in unspecified limb: Secondary | ICD-10-CM | POA: Diagnosis not present

## 2012-06-24 DIAGNOSIS — D510 Vitamin B12 deficiency anemia due to intrinsic factor deficiency: Secondary | ICD-10-CM | POA: Diagnosis not present

## 2012-06-24 DIAGNOSIS — H409 Unspecified glaucoma: Secondary | ICD-10-CM | POA: Diagnosis not present

## 2012-06-24 DIAGNOSIS — M159 Polyosteoarthritis, unspecified: Secondary | ICD-10-CM | POA: Diagnosis not present

## 2012-06-24 DIAGNOSIS — I10 Essential (primary) hypertension: Secondary | ICD-10-CM | POA: Diagnosis not present

## 2012-06-24 DIAGNOSIS — R32 Unspecified urinary incontinence: Secondary | ICD-10-CM | POA: Diagnosis not present

## 2012-06-24 DIAGNOSIS — G608 Other hereditary and idiopathic neuropathies: Secondary | ICD-10-CM | POA: Diagnosis not present

## 2012-06-30 DIAGNOSIS — H409 Unspecified glaucoma: Secondary | ICD-10-CM | POA: Diagnosis not present

## 2012-06-30 DIAGNOSIS — G608 Other hereditary and idiopathic neuropathies: Secondary | ICD-10-CM | POA: Diagnosis not present

## 2012-06-30 DIAGNOSIS — R32 Unspecified urinary incontinence: Secondary | ICD-10-CM | POA: Diagnosis not present

## 2012-06-30 DIAGNOSIS — Z8781 Personal history of (healed) traumatic fracture: Secondary | ICD-10-CM | POA: Diagnosis not present

## 2012-06-30 DIAGNOSIS — I10 Essential (primary) hypertension: Secondary | ICD-10-CM | POA: Diagnosis not present

## 2012-06-30 DIAGNOSIS — Z9181 History of falling: Secondary | ICD-10-CM | POA: Diagnosis not present

## 2012-06-30 DIAGNOSIS — M159 Polyosteoarthritis, unspecified: Secondary | ICD-10-CM | POA: Diagnosis not present

## 2012-06-30 DIAGNOSIS — D510 Vitamin B12 deficiency anemia due to intrinsic factor deficiency: Secondary | ICD-10-CM | POA: Diagnosis not present

## 2012-07-22 DIAGNOSIS — G608 Other hereditary and idiopathic neuropathies: Secondary | ICD-10-CM | POA: Diagnosis not present

## 2012-07-22 DIAGNOSIS — H409 Unspecified glaucoma: Secondary | ICD-10-CM | POA: Diagnosis not present

## 2012-07-22 DIAGNOSIS — R32 Unspecified urinary incontinence: Secondary | ICD-10-CM | POA: Diagnosis not present

## 2012-07-22 DIAGNOSIS — M159 Polyosteoarthritis, unspecified: Secondary | ICD-10-CM | POA: Diagnosis not present

## 2012-07-22 DIAGNOSIS — D510 Vitamin B12 deficiency anemia due to intrinsic factor deficiency: Secondary | ICD-10-CM | POA: Diagnosis not present

## 2012-07-22 DIAGNOSIS — I10 Essential (primary) hypertension: Secondary | ICD-10-CM | POA: Diagnosis not present

## 2012-08-09 DIAGNOSIS — M1991 Primary osteoarthritis, unspecified site: Secondary | ICD-10-CM | POA: Diagnosis not present

## 2012-08-09 DIAGNOSIS — M25569 Pain in unspecified knee: Secondary | ICD-10-CM | POA: Diagnosis not present

## 2012-08-09 DIAGNOSIS — Z0000 Encounter for general adult medical examination without abnormal findings: Secondary | ICD-10-CM | POA: Diagnosis not present

## 2012-08-09 DIAGNOSIS — I10 Essential (primary) hypertension: Secondary | ICD-10-CM | POA: Diagnosis not present

## 2012-08-09 DIAGNOSIS — D510 Vitamin B12 deficiency anemia due to intrinsic factor deficiency: Secondary | ICD-10-CM | POA: Diagnosis not present

## 2012-08-09 DIAGNOSIS — F0390 Unspecified dementia without behavioral disturbance: Secondary | ICD-10-CM | POA: Diagnosis not present

## 2012-08-09 DIAGNOSIS — E782 Mixed hyperlipidemia: Secondary | ICD-10-CM | POA: Diagnosis not present

## 2012-08-09 DIAGNOSIS — E559 Vitamin D deficiency, unspecified: Secondary | ICD-10-CM | POA: Diagnosis not present

## 2012-08-09 DIAGNOSIS — M25549 Pain in joints of unspecified hand: Secondary | ICD-10-CM | POA: Diagnosis not present

## 2012-08-11 DIAGNOSIS — H409 Unspecified glaucoma: Secondary | ICD-10-CM | POA: Diagnosis not present

## 2012-08-11 DIAGNOSIS — I10 Essential (primary) hypertension: Secondary | ICD-10-CM | POA: Diagnosis not present

## 2012-08-11 DIAGNOSIS — G608 Other hereditary and idiopathic neuropathies: Secondary | ICD-10-CM | POA: Diagnosis not present

## 2012-08-11 DIAGNOSIS — M159 Polyosteoarthritis, unspecified: Secondary | ICD-10-CM | POA: Diagnosis not present

## 2012-08-11 DIAGNOSIS — R32 Unspecified urinary incontinence: Secondary | ICD-10-CM | POA: Diagnosis not present

## 2012-08-11 DIAGNOSIS — D510 Vitamin B12 deficiency anemia due to intrinsic factor deficiency: Secondary | ICD-10-CM | POA: Diagnosis not present

## 2012-08-16 DIAGNOSIS — M159 Polyosteoarthritis, unspecified: Secondary | ICD-10-CM | POA: Diagnosis not present

## 2012-08-16 DIAGNOSIS — R32 Unspecified urinary incontinence: Secondary | ICD-10-CM | POA: Diagnosis not present

## 2012-08-16 DIAGNOSIS — Z8781 Personal history of (healed) traumatic fracture: Secondary | ICD-10-CM | POA: Diagnosis not present

## 2012-08-16 DIAGNOSIS — Z9181 History of falling: Secondary | ICD-10-CM | POA: Diagnosis not present

## 2012-08-16 DIAGNOSIS — H409 Unspecified glaucoma: Secondary | ICD-10-CM | POA: Diagnosis not present

## 2012-08-16 DIAGNOSIS — I10 Essential (primary) hypertension: Secondary | ICD-10-CM | POA: Diagnosis not present

## 2012-08-16 DIAGNOSIS — G608 Other hereditary and idiopathic neuropathies: Secondary | ICD-10-CM | POA: Diagnosis not present

## 2012-08-16 DIAGNOSIS — D510 Vitamin B12 deficiency anemia due to intrinsic factor deficiency: Secondary | ICD-10-CM | POA: Diagnosis not present

## 2012-08-17 DIAGNOSIS — Z9181 History of falling: Secondary | ICD-10-CM | POA: Diagnosis not present

## 2012-08-17 DIAGNOSIS — I10 Essential (primary) hypertension: Secondary | ICD-10-CM | POA: Diagnosis not present

## 2012-08-17 DIAGNOSIS — M159 Polyosteoarthritis, unspecified: Secondary | ICD-10-CM | POA: Diagnosis not present

## 2012-08-17 DIAGNOSIS — M1710 Unilateral primary osteoarthritis, unspecified knee: Secondary | ICD-10-CM | POA: Diagnosis not present

## 2012-08-17 DIAGNOSIS — H409 Unspecified glaucoma: Secondary | ICD-10-CM | POA: Diagnosis not present

## 2012-08-17 DIAGNOSIS — G608 Other hereditary and idiopathic neuropathies: Secondary | ICD-10-CM | POA: Diagnosis not present

## 2012-08-17 DIAGNOSIS — R32 Unspecified urinary incontinence: Secondary | ICD-10-CM | POA: Diagnosis not present

## 2012-08-17 DIAGNOSIS — M25569 Pain in unspecified knee: Secondary | ICD-10-CM | POA: Diagnosis not present

## 2012-08-17 DIAGNOSIS — D510 Vitamin B12 deficiency anemia due to intrinsic factor deficiency: Secondary | ICD-10-CM | POA: Diagnosis not present

## 2012-08-17 DIAGNOSIS — Z8781 Personal history of (healed) traumatic fracture: Secondary | ICD-10-CM | POA: Diagnosis not present

## 2012-08-22 DIAGNOSIS — H409 Unspecified glaucoma: Secondary | ICD-10-CM | POA: Diagnosis not present

## 2012-08-22 DIAGNOSIS — G608 Other hereditary and idiopathic neuropathies: Secondary | ICD-10-CM | POA: Diagnosis not present

## 2012-08-22 DIAGNOSIS — M159 Polyosteoarthritis, unspecified: Secondary | ICD-10-CM | POA: Diagnosis not present

## 2012-08-22 DIAGNOSIS — R32 Unspecified urinary incontinence: Secondary | ICD-10-CM | POA: Diagnosis not present

## 2012-08-22 DIAGNOSIS — D510 Vitamin B12 deficiency anemia due to intrinsic factor deficiency: Secondary | ICD-10-CM | POA: Diagnosis not present

## 2012-08-22 DIAGNOSIS — I10 Essential (primary) hypertension: Secondary | ICD-10-CM | POA: Diagnosis not present

## 2012-08-31 DIAGNOSIS — Z8619 Personal history of other infectious and parasitic diseases: Secondary | ICD-10-CM

## 2012-08-31 HISTORY — DX: Personal history of other infectious and parasitic diseases: Z86.19

## 2012-09-05 DIAGNOSIS — E2839 Other primary ovarian failure: Secondary | ICD-10-CM | POA: Diagnosis not present

## 2012-09-15 DIAGNOSIS — H409 Unspecified glaucoma: Secondary | ICD-10-CM | POA: Diagnosis not present

## 2012-09-15 DIAGNOSIS — H4011X0 Primary open-angle glaucoma, stage unspecified: Secondary | ICD-10-CM | POA: Diagnosis not present

## 2012-09-22 DIAGNOSIS — H409 Unspecified glaucoma: Secondary | ICD-10-CM | POA: Diagnosis not present

## 2012-09-22 DIAGNOSIS — R32 Unspecified urinary incontinence: Secondary | ICD-10-CM | POA: Diagnosis not present

## 2012-09-22 DIAGNOSIS — M159 Polyosteoarthritis, unspecified: Secondary | ICD-10-CM | POA: Diagnosis not present

## 2012-09-22 DIAGNOSIS — I10 Essential (primary) hypertension: Secondary | ICD-10-CM | POA: Diagnosis not present

## 2012-09-22 DIAGNOSIS — D510 Vitamin B12 deficiency anemia due to intrinsic factor deficiency: Secondary | ICD-10-CM | POA: Diagnosis not present

## 2012-09-22 DIAGNOSIS — G608 Other hereditary and idiopathic neuropathies: Secondary | ICD-10-CM | POA: Diagnosis not present

## 2012-09-29 DIAGNOSIS — L97509 Non-pressure chronic ulcer of other part of unspecified foot with unspecified severity: Secondary | ICD-10-CM | POA: Diagnosis not present

## 2012-09-29 DIAGNOSIS — M79609 Pain in unspecified limb: Secondary | ICD-10-CM | POA: Diagnosis not present

## 2012-09-29 DIAGNOSIS — B351 Tinea unguium: Secondary | ICD-10-CM | POA: Diagnosis not present

## 2012-10-10 DIAGNOSIS — D510 Vitamin B12 deficiency anemia due to intrinsic factor deficiency: Secondary | ICD-10-CM | POA: Diagnosis not present

## 2012-10-10 DIAGNOSIS — R32 Unspecified urinary incontinence: Secondary | ICD-10-CM | POA: Diagnosis not present

## 2012-10-10 DIAGNOSIS — M159 Polyosteoarthritis, unspecified: Secondary | ICD-10-CM | POA: Diagnosis not present

## 2012-10-10 DIAGNOSIS — G608 Other hereditary and idiopathic neuropathies: Secondary | ICD-10-CM | POA: Diagnosis not present

## 2012-10-10 DIAGNOSIS — I10 Essential (primary) hypertension: Secondary | ICD-10-CM | POA: Diagnosis not present

## 2012-10-10 DIAGNOSIS — H409 Unspecified glaucoma: Secondary | ICD-10-CM | POA: Diagnosis not present

## 2012-10-15 DIAGNOSIS — Z9181 History of falling: Secondary | ICD-10-CM | POA: Diagnosis not present

## 2012-10-15 DIAGNOSIS — D510 Vitamin B12 deficiency anemia due to intrinsic factor deficiency: Secondary | ICD-10-CM | POA: Diagnosis not present

## 2012-10-15 DIAGNOSIS — G608 Other hereditary and idiopathic neuropathies: Secondary | ICD-10-CM | POA: Diagnosis not present

## 2012-10-15 DIAGNOSIS — IMO0002 Reserved for concepts with insufficient information to code with codable children: Secondary | ICD-10-CM | POA: Diagnosis not present

## 2012-10-15 DIAGNOSIS — H409 Unspecified glaucoma: Secondary | ICD-10-CM | POA: Diagnosis not present

## 2012-10-15 DIAGNOSIS — I10 Essential (primary) hypertension: Secondary | ICD-10-CM | POA: Diagnosis not present

## 2012-10-15 DIAGNOSIS — Z8781 Personal history of (healed) traumatic fracture: Secondary | ICD-10-CM | POA: Diagnosis not present

## 2012-10-15 DIAGNOSIS — R32 Unspecified urinary incontinence: Secondary | ICD-10-CM | POA: Diagnosis not present

## 2012-10-15 DIAGNOSIS — M159 Polyosteoarthritis, unspecified: Secondary | ICD-10-CM | POA: Diagnosis not present

## 2012-10-20 DIAGNOSIS — L97509 Non-pressure chronic ulcer of other part of unspecified foot with unspecified severity: Secondary | ICD-10-CM | POA: Diagnosis not present

## 2012-10-24 DIAGNOSIS — M159 Polyosteoarthritis, unspecified: Secondary | ICD-10-CM | POA: Diagnosis not present

## 2012-10-24 DIAGNOSIS — D510 Vitamin B12 deficiency anemia due to intrinsic factor deficiency: Secondary | ICD-10-CM | POA: Diagnosis not present

## 2012-10-24 DIAGNOSIS — I10 Essential (primary) hypertension: Secondary | ICD-10-CM | POA: Diagnosis not present

## 2012-10-24 DIAGNOSIS — IMO0002 Reserved for concepts with insufficient information to code with codable children: Secondary | ICD-10-CM | POA: Diagnosis not present

## 2012-10-24 DIAGNOSIS — M1710 Unilateral primary osteoarthritis, unspecified knee: Secondary | ICD-10-CM | POA: Diagnosis not present

## 2012-10-24 DIAGNOSIS — Z8781 Personal history of (healed) traumatic fracture: Secondary | ICD-10-CM | POA: Diagnosis not present

## 2012-10-24 DIAGNOSIS — F329 Major depressive disorder, single episode, unspecified: Secondary | ICD-10-CM | POA: Diagnosis not present

## 2012-10-24 DIAGNOSIS — M25569 Pain in unspecified knee: Secondary | ICD-10-CM | POA: Diagnosis not present

## 2012-10-24 DIAGNOSIS — H409 Unspecified glaucoma: Secondary | ICD-10-CM | POA: Diagnosis not present

## 2012-10-24 DIAGNOSIS — Z79899 Other long term (current) drug therapy: Secondary | ICD-10-CM | POA: Diagnosis not present

## 2012-10-24 DIAGNOSIS — M79609 Pain in unspecified limb: Secondary | ICD-10-CM | POA: Diagnosis not present

## 2012-10-24 DIAGNOSIS — F0280 Dementia in other diseases classified elsewhere without behavioral disturbance: Secondary | ICD-10-CM | POA: Diagnosis not present

## 2012-10-24 DIAGNOSIS — Z9181 History of falling: Secondary | ICD-10-CM | POA: Diagnosis not present

## 2012-10-24 DIAGNOSIS — R32 Unspecified urinary incontinence: Secondary | ICD-10-CM | POA: Diagnosis not present

## 2012-10-24 DIAGNOSIS — G608 Other hereditary and idiopathic neuropathies: Secondary | ICD-10-CM | POA: Diagnosis not present

## 2012-10-27 DIAGNOSIS — H4011X0 Primary open-angle glaucoma, stage unspecified: Secondary | ICD-10-CM | POA: Diagnosis not present

## 2012-10-27 DIAGNOSIS — H409 Unspecified glaucoma: Secondary | ICD-10-CM | POA: Diagnosis not present

## 2012-11-02 DIAGNOSIS — M25539 Pain in unspecified wrist: Secondary | ICD-10-CM | POA: Diagnosis not present

## 2012-11-02 DIAGNOSIS — E289 Ovarian dysfunction, unspecified: Secondary | ICD-10-CM | POA: Diagnosis not present

## 2012-11-02 DIAGNOSIS — E782 Mixed hyperlipidemia: Secondary | ICD-10-CM | POA: Diagnosis not present

## 2012-11-02 DIAGNOSIS — F0390 Unspecified dementia without behavioral disturbance: Secondary | ICD-10-CM | POA: Diagnosis not present

## 2012-11-02 DIAGNOSIS — Z006 Encounter for examination for normal comparison and control in clinical research program: Secondary | ICD-10-CM | POA: Diagnosis not present

## 2012-11-02 DIAGNOSIS — I10 Essential (primary) hypertension: Secondary | ICD-10-CM | POA: Diagnosis not present

## 2012-11-14 DIAGNOSIS — R945 Abnormal results of liver function studies: Secondary | ICD-10-CM | POA: Diagnosis not present

## 2012-11-21 DIAGNOSIS — M79609 Pain in unspecified limb: Secondary | ICD-10-CM | POA: Diagnosis not present

## 2012-11-21 DIAGNOSIS — Z79899 Other long term (current) drug therapy: Secondary | ICD-10-CM | POA: Diagnosis not present

## 2012-11-21 DIAGNOSIS — M1710 Unilateral primary osteoarthritis, unspecified knee: Secondary | ICD-10-CM | POA: Diagnosis not present

## 2012-11-21 DIAGNOSIS — F0280 Dementia in other diseases classified elsewhere without behavioral disturbance: Secondary | ICD-10-CM | POA: Diagnosis not present

## 2012-11-21 DIAGNOSIS — M25569 Pain in unspecified knee: Secondary | ICD-10-CM | POA: Diagnosis not present

## 2012-11-21 DIAGNOSIS — F329 Major depressive disorder, single episode, unspecified: Secondary | ICD-10-CM | POA: Diagnosis not present

## 2012-11-21 DIAGNOSIS — G309 Alzheimer's disease, unspecified: Secondary | ICD-10-CM | POA: Diagnosis not present

## 2012-11-22 DIAGNOSIS — D510 Vitamin B12 deficiency anemia due to intrinsic factor deficiency: Secondary | ICD-10-CM | POA: Diagnosis not present

## 2012-11-22 DIAGNOSIS — I10 Essential (primary) hypertension: Secondary | ICD-10-CM | POA: Diagnosis not present

## 2012-11-22 DIAGNOSIS — G608 Other hereditary and idiopathic neuropathies: Secondary | ICD-10-CM | POA: Diagnosis not present

## 2012-11-22 DIAGNOSIS — IMO0002 Reserved for concepts with insufficient information to code with codable children: Secondary | ICD-10-CM | POA: Diagnosis not present

## 2012-11-22 DIAGNOSIS — M159 Polyosteoarthritis, unspecified: Secondary | ICD-10-CM | POA: Diagnosis not present

## 2012-11-22 DIAGNOSIS — R32 Unspecified urinary incontinence: Secondary | ICD-10-CM | POA: Diagnosis not present

## 2012-12-09 DIAGNOSIS — IMO0002 Reserved for concepts with insufficient information to code with codable children: Secondary | ICD-10-CM | POA: Diagnosis not present

## 2012-12-09 DIAGNOSIS — G608 Other hereditary and idiopathic neuropathies: Secondary | ICD-10-CM | POA: Diagnosis not present

## 2012-12-09 DIAGNOSIS — R32 Unspecified urinary incontinence: Secondary | ICD-10-CM | POA: Diagnosis not present

## 2012-12-09 DIAGNOSIS — I10 Essential (primary) hypertension: Secondary | ICD-10-CM | POA: Diagnosis not present

## 2012-12-09 DIAGNOSIS — M159 Polyosteoarthritis, unspecified: Secondary | ICD-10-CM | POA: Diagnosis not present

## 2012-12-09 DIAGNOSIS — D510 Vitamin B12 deficiency anemia due to intrinsic factor deficiency: Secondary | ICD-10-CM | POA: Diagnosis not present

## 2012-12-13 DIAGNOSIS — Z79899 Other long term (current) drug therapy: Secondary | ICD-10-CM | POA: Diagnosis not present

## 2012-12-13 DIAGNOSIS — F329 Major depressive disorder, single episode, unspecified: Secondary | ICD-10-CM | POA: Diagnosis not present

## 2012-12-13 DIAGNOSIS — M25569 Pain in unspecified knee: Secondary | ICD-10-CM | POA: Diagnosis not present

## 2012-12-13 DIAGNOSIS — G4701 Insomnia due to medical condition: Secondary | ICD-10-CM | POA: Diagnosis not present

## 2012-12-13 DIAGNOSIS — R609 Edema, unspecified: Secondary | ICD-10-CM | POA: Diagnosis not present

## 2012-12-13 DIAGNOSIS — I10 Essential (primary) hypertension: Secondary | ICD-10-CM | POA: Diagnosis not present

## 2012-12-13 DIAGNOSIS — F0390 Unspecified dementia without behavioral disturbance: Secondary | ICD-10-CM | POA: Diagnosis not present

## 2012-12-14 DIAGNOSIS — I10 Essential (primary) hypertension: Secondary | ICD-10-CM | POA: Diagnosis not present

## 2012-12-14 DIAGNOSIS — H409 Unspecified glaucoma: Secondary | ICD-10-CM | POA: Diagnosis not present

## 2012-12-14 DIAGNOSIS — Z8781 Personal history of (healed) traumatic fracture: Secondary | ICD-10-CM | POA: Diagnosis not present

## 2012-12-14 DIAGNOSIS — Z9181 History of falling: Secondary | ICD-10-CM | POA: Diagnosis not present

## 2012-12-14 DIAGNOSIS — M159 Polyosteoarthritis, unspecified: Secondary | ICD-10-CM | POA: Diagnosis not present

## 2012-12-14 DIAGNOSIS — D510 Vitamin B12 deficiency anemia due to intrinsic factor deficiency: Secondary | ICD-10-CM | POA: Diagnosis not present

## 2012-12-14 DIAGNOSIS — R32 Unspecified urinary incontinence: Secondary | ICD-10-CM | POA: Diagnosis not present

## 2012-12-14 DIAGNOSIS — G608 Other hereditary and idiopathic neuropathies: Secondary | ICD-10-CM | POA: Diagnosis not present

## 2012-12-19 DIAGNOSIS — H409 Unspecified glaucoma: Secondary | ICD-10-CM | POA: Diagnosis not present

## 2012-12-19 DIAGNOSIS — M159 Polyosteoarthritis, unspecified: Secondary | ICD-10-CM | POA: Diagnosis not present

## 2012-12-19 DIAGNOSIS — I10 Essential (primary) hypertension: Secondary | ICD-10-CM | POA: Diagnosis not present

## 2012-12-19 DIAGNOSIS — R32 Unspecified urinary incontinence: Secondary | ICD-10-CM | POA: Diagnosis not present

## 2012-12-19 DIAGNOSIS — D510 Vitamin B12 deficiency anemia due to intrinsic factor deficiency: Secondary | ICD-10-CM | POA: Diagnosis not present

## 2012-12-19 DIAGNOSIS — G608 Other hereditary and idiopathic neuropathies: Secondary | ICD-10-CM | POA: Diagnosis not present

## 2012-12-23 DIAGNOSIS — I10 Essential (primary) hypertension: Secondary | ICD-10-CM | POA: Diagnosis not present

## 2012-12-23 DIAGNOSIS — G608 Other hereditary and idiopathic neuropathies: Secondary | ICD-10-CM | POA: Diagnosis not present

## 2012-12-23 DIAGNOSIS — M159 Polyosteoarthritis, unspecified: Secondary | ICD-10-CM | POA: Diagnosis not present

## 2012-12-23 DIAGNOSIS — D510 Vitamin B12 deficiency anemia due to intrinsic factor deficiency: Secondary | ICD-10-CM | POA: Diagnosis not present

## 2012-12-23 DIAGNOSIS — H409 Unspecified glaucoma: Secondary | ICD-10-CM | POA: Diagnosis not present

## 2012-12-23 DIAGNOSIS — R32 Unspecified urinary incontinence: Secondary | ICD-10-CM | POA: Diagnosis not present

## 2013-01-09 DIAGNOSIS — Z8781 Personal history of (healed) traumatic fracture: Secondary | ICD-10-CM | POA: Diagnosis not present

## 2013-01-09 DIAGNOSIS — M159 Polyosteoarthritis, unspecified: Secondary | ICD-10-CM | POA: Diagnosis not present

## 2013-01-09 DIAGNOSIS — R32 Unspecified urinary incontinence: Secondary | ICD-10-CM | POA: Diagnosis not present

## 2013-01-09 DIAGNOSIS — G608 Other hereditary and idiopathic neuropathies: Secondary | ICD-10-CM | POA: Diagnosis not present

## 2013-01-09 DIAGNOSIS — H409 Unspecified glaucoma: Secondary | ICD-10-CM | POA: Diagnosis not present

## 2013-01-09 DIAGNOSIS — D510 Vitamin B12 deficiency anemia due to intrinsic factor deficiency: Secondary | ICD-10-CM | POA: Diagnosis not present

## 2013-01-09 DIAGNOSIS — Z9181 History of falling: Secondary | ICD-10-CM | POA: Diagnosis not present

## 2013-01-09 DIAGNOSIS — I10 Essential (primary) hypertension: Secondary | ICD-10-CM | POA: Diagnosis not present

## 2013-01-25 DIAGNOSIS — D510 Vitamin B12 deficiency anemia due to intrinsic factor deficiency: Secondary | ICD-10-CM | POA: Diagnosis not present

## 2013-01-25 DIAGNOSIS — I10 Essential (primary) hypertension: Secondary | ICD-10-CM | POA: Diagnosis not present

## 2013-01-25 DIAGNOSIS — M159 Polyosteoarthritis, unspecified: Secondary | ICD-10-CM | POA: Diagnosis not present

## 2013-01-25 DIAGNOSIS — H409 Unspecified glaucoma: Secondary | ICD-10-CM | POA: Diagnosis not present

## 2013-01-25 DIAGNOSIS — R32 Unspecified urinary incontinence: Secondary | ICD-10-CM | POA: Diagnosis not present

## 2013-01-25 DIAGNOSIS — G608 Other hereditary and idiopathic neuropathies: Secondary | ICD-10-CM | POA: Diagnosis not present

## 2013-01-31 DIAGNOSIS — N390 Urinary tract infection, site not specified: Secondary | ICD-10-CM | POA: Diagnosis not present

## 2013-01-31 DIAGNOSIS — R32 Unspecified urinary incontinence: Secondary | ICD-10-CM | POA: Diagnosis not present

## 2013-01-31 DIAGNOSIS — F0390 Unspecified dementia without behavioral disturbance: Secondary | ICD-10-CM | POA: Diagnosis not present

## 2013-01-31 DIAGNOSIS — E782 Mixed hyperlipidemia: Secondary | ICD-10-CM | POA: Diagnosis not present

## 2013-01-31 DIAGNOSIS — I10 Essential (primary) hypertension: Secondary | ICD-10-CM | POA: Diagnosis not present

## 2013-02-01 ENCOUNTER — Emergency Department: Payer: Self-pay | Admitting: Emergency Medicine

## 2013-02-01 DIAGNOSIS — Z9889 Other specified postprocedural states: Secondary | ICD-10-CM | POA: Diagnosis not present

## 2013-02-01 DIAGNOSIS — Z79899 Other long term (current) drug therapy: Secondary | ICD-10-CM | POA: Diagnosis not present

## 2013-02-01 DIAGNOSIS — F0390 Unspecified dementia without behavioral disturbance: Secondary | ICD-10-CM | POA: Diagnosis not present

## 2013-02-01 DIAGNOSIS — R4182 Altered mental status, unspecified: Secondary | ICD-10-CM | POA: Diagnosis not present

## 2013-02-01 DIAGNOSIS — I10 Essential (primary) hypertension: Secondary | ICD-10-CM | POA: Diagnosis not present

## 2013-02-01 LAB — URINALYSIS, COMPLETE
Bilirubin,UR: NEGATIVE
Ketone: NEGATIVE
Nitrite: NEGATIVE
Ph: 8 (ref 4.5–8.0)
Protein: NEGATIVE
RBC,UR: NONE SEEN /HPF (ref 0–5)
Specific Gravity: 1.003 (ref 1.003–1.030)
Squamous Epithelial: <1

## 2013-02-01 LAB — COMPREHENSIVE METABOLIC PANEL
Alkaline Phosphatase: 55 U/L (ref 50–136)
BUN: 23 mg/dL — ABNORMAL HIGH (ref 7–18)
Bilirubin,Total: 0.3 mg/dL (ref 0.2–1.0)
Calcium, Total: 9.6 mg/dL (ref 8.5–10.1)
Co2: 34 mmol/L — ABNORMAL HIGH (ref 21–32)
Creatinine: 1.1 mg/dL (ref 0.60–1.30)
Glucose: 96 mg/dL (ref 65–99)
Potassium: 3.4 mmol/L — ABNORMAL LOW (ref 3.5–5.1)
SGPT (ALT): 10 U/L — ABNORMAL LOW (ref 12–78)
Sodium: 137 mmol/L (ref 136–145)

## 2013-02-01 LAB — CBC: HGB: 11.8 g/dL — ABNORMAL LOW (ref 12.0–16.0)

## 2013-02-01 LAB — TROPONIN I: Troponin-I: <0.02 ng/mL

## 2013-02-07 DIAGNOSIS — G608 Other hereditary and idiopathic neuropathies: Secondary | ICD-10-CM | POA: Diagnosis not present

## 2013-02-07 DIAGNOSIS — M159 Polyosteoarthritis, unspecified: Secondary | ICD-10-CM | POA: Diagnosis not present

## 2013-02-07 DIAGNOSIS — H409 Unspecified glaucoma: Secondary | ICD-10-CM | POA: Diagnosis not present

## 2013-02-07 DIAGNOSIS — I10 Essential (primary) hypertension: Secondary | ICD-10-CM | POA: Diagnosis not present

## 2013-02-07 DIAGNOSIS — R32 Unspecified urinary incontinence: Secondary | ICD-10-CM | POA: Diagnosis not present

## 2013-02-07 DIAGNOSIS — D510 Vitamin B12 deficiency anemia due to intrinsic factor deficiency: Secondary | ICD-10-CM | POA: Diagnosis not present

## 2013-02-09 DIAGNOSIS — R32 Unspecified urinary incontinence: Secondary | ICD-10-CM | POA: Diagnosis not present

## 2013-02-09 DIAGNOSIS — M159 Polyosteoarthritis, unspecified: Secondary | ICD-10-CM | POA: Diagnosis not present

## 2013-02-09 DIAGNOSIS — D510 Vitamin B12 deficiency anemia due to intrinsic factor deficiency: Secondary | ICD-10-CM | POA: Diagnosis not present

## 2013-02-09 DIAGNOSIS — H409 Unspecified glaucoma: Secondary | ICD-10-CM | POA: Diagnosis not present

## 2013-02-09 DIAGNOSIS — I10 Essential (primary) hypertension: Secondary | ICD-10-CM | POA: Diagnosis not present

## 2013-02-09 DIAGNOSIS — G608 Other hereditary and idiopathic neuropathies: Secondary | ICD-10-CM | POA: Diagnosis not present

## 2013-02-12 DIAGNOSIS — H409 Unspecified glaucoma: Secondary | ICD-10-CM | POA: Diagnosis not present

## 2013-02-12 DIAGNOSIS — M159 Polyosteoarthritis, unspecified: Secondary | ICD-10-CM | POA: Diagnosis not present

## 2013-02-12 DIAGNOSIS — Z9181 History of falling: Secondary | ICD-10-CM | POA: Diagnosis not present

## 2013-02-12 DIAGNOSIS — N390 Urinary tract infection, site not specified: Secondary | ICD-10-CM | POA: Diagnosis not present

## 2013-02-12 DIAGNOSIS — D510 Vitamin B12 deficiency anemia due to intrinsic factor deficiency: Secondary | ICD-10-CM | POA: Diagnosis not present

## 2013-02-12 DIAGNOSIS — I10 Essential (primary) hypertension: Secondary | ICD-10-CM | POA: Diagnosis not present

## 2013-02-12 DIAGNOSIS — Z8781 Personal history of (healed) traumatic fracture: Secondary | ICD-10-CM | POA: Diagnosis not present

## 2013-02-12 DIAGNOSIS — R32 Unspecified urinary incontinence: Secondary | ICD-10-CM | POA: Diagnosis not present

## 2013-02-12 DIAGNOSIS — G608 Other hereditary and idiopathic neuropathies: Secondary | ICD-10-CM | POA: Diagnosis not present

## 2013-02-13 DIAGNOSIS — G608 Other hereditary and idiopathic neuropathies: Secondary | ICD-10-CM | POA: Diagnosis not present

## 2013-02-13 DIAGNOSIS — M159 Polyosteoarthritis, unspecified: Secondary | ICD-10-CM | POA: Diagnosis not present

## 2013-02-13 DIAGNOSIS — N390 Urinary tract infection, site not specified: Secondary | ICD-10-CM | POA: Diagnosis not present

## 2013-02-13 DIAGNOSIS — R32 Unspecified urinary incontinence: Secondary | ICD-10-CM | POA: Diagnosis not present

## 2013-02-13 DIAGNOSIS — D510 Vitamin B12 deficiency anemia due to intrinsic factor deficiency: Secondary | ICD-10-CM | POA: Diagnosis not present

## 2013-02-13 DIAGNOSIS — I10 Essential (primary) hypertension: Secondary | ICD-10-CM | POA: Diagnosis not present

## 2013-02-14 DIAGNOSIS — Z8781 Personal history of (healed) traumatic fracture: Secondary | ICD-10-CM | POA: Diagnosis not present

## 2013-02-14 DIAGNOSIS — R32 Unspecified urinary incontinence: Secondary | ICD-10-CM | POA: Diagnosis not present

## 2013-02-14 DIAGNOSIS — N390 Urinary tract infection, site not specified: Secondary | ICD-10-CM | POA: Diagnosis not present

## 2013-02-14 DIAGNOSIS — Z9181 History of falling: Secondary | ICD-10-CM | POA: Diagnosis not present

## 2013-02-14 DIAGNOSIS — D510 Vitamin B12 deficiency anemia due to intrinsic factor deficiency: Secondary | ICD-10-CM | POA: Diagnosis not present

## 2013-02-14 DIAGNOSIS — G608 Other hereditary and idiopathic neuropathies: Secondary | ICD-10-CM | POA: Diagnosis not present

## 2013-02-14 DIAGNOSIS — M159 Polyosteoarthritis, unspecified: Secondary | ICD-10-CM | POA: Diagnosis not present

## 2013-02-14 DIAGNOSIS — I10 Essential (primary) hypertension: Secondary | ICD-10-CM | POA: Diagnosis not present

## 2013-02-15 DIAGNOSIS — M79609 Pain in unspecified limb: Secondary | ICD-10-CM | POA: Diagnosis not present

## 2013-02-15 DIAGNOSIS — B351 Tinea unguium: Secondary | ICD-10-CM | POA: Diagnosis not present

## 2013-02-17 DIAGNOSIS — I10 Essential (primary) hypertension: Secondary | ICD-10-CM | POA: Diagnosis not present

## 2013-02-17 DIAGNOSIS — D510 Vitamin B12 deficiency anemia due to intrinsic factor deficiency: Secondary | ICD-10-CM | POA: Diagnosis not present

## 2013-02-17 DIAGNOSIS — G608 Other hereditary and idiopathic neuropathies: Secondary | ICD-10-CM | POA: Diagnosis not present

## 2013-02-17 DIAGNOSIS — N390 Urinary tract infection, site not specified: Secondary | ICD-10-CM | POA: Diagnosis not present

## 2013-02-17 DIAGNOSIS — R32 Unspecified urinary incontinence: Secondary | ICD-10-CM | POA: Diagnosis not present

## 2013-02-17 DIAGNOSIS — M159 Polyosteoarthritis, unspecified: Secondary | ICD-10-CM | POA: Diagnosis not present

## 2013-02-20 DIAGNOSIS — M1710 Unilateral primary osteoarthritis, unspecified knee: Secondary | ICD-10-CM | POA: Diagnosis not present

## 2013-02-20 DIAGNOSIS — F329 Major depressive disorder, single episode, unspecified: Secondary | ICD-10-CM | POA: Diagnosis not present

## 2013-02-20 DIAGNOSIS — Z79899 Other long term (current) drug therapy: Secondary | ICD-10-CM | POA: Diagnosis not present

## 2013-02-20 DIAGNOSIS — M25569 Pain in unspecified knee: Secondary | ICD-10-CM | POA: Diagnosis not present

## 2013-02-20 DIAGNOSIS — G309 Alzheimer's disease, unspecified: Secondary | ICD-10-CM | POA: Diagnosis not present

## 2013-02-20 DIAGNOSIS — M79609 Pain in unspecified limb: Secondary | ICD-10-CM | POA: Diagnosis not present

## 2013-02-23 DIAGNOSIS — R32 Unspecified urinary incontinence: Secondary | ICD-10-CM | POA: Diagnosis not present

## 2013-02-23 DIAGNOSIS — G608 Other hereditary and idiopathic neuropathies: Secondary | ICD-10-CM | POA: Diagnosis not present

## 2013-02-23 DIAGNOSIS — D510 Vitamin B12 deficiency anemia due to intrinsic factor deficiency: Secondary | ICD-10-CM | POA: Diagnosis not present

## 2013-02-23 DIAGNOSIS — I10 Essential (primary) hypertension: Secondary | ICD-10-CM | POA: Diagnosis not present

## 2013-02-23 DIAGNOSIS — M159 Polyosteoarthritis, unspecified: Secondary | ICD-10-CM | POA: Diagnosis not present

## 2013-02-23 DIAGNOSIS — N390 Urinary tract infection, site not specified: Secondary | ICD-10-CM | POA: Diagnosis not present

## 2013-02-28 DIAGNOSIS — R32 Unspecified urinary incontinence: Secondary | ICD-10-CM | POA: Diagnosis not present

## 2013-02-28 DIAGNOSIS — N390 Urinary tract infection, site not specified: Secondary | ICD-10-CM | POA: Diagnosis not present

## 2013-02-28 DIAGNOSIS — D510 Vitamin B12 deficiency anemia due to intrinsic factor deficiency: Secondary | ICD-10-CM | POA: Diagnosis not present

## 2013-02-28 DIAGNOSIS — G608 Other hereditary and idiopathic neuropathies: Secondary | ICD-10-CM | POA: Diagnosis not present

## 2013-02-28 DIAGNOSIS — M159 Polyosteoarthritis, unspecified: Secondary | ICD-10-CM | POA: Diagnosis not present

## 2013-02-28 DIAGNOSIS — I10 Essential (primary) hypertension: Secondary | ICD-10-CM | POA: Diagnosis not present

## 2013-03-28 DIAGNOSIS — D510 Vitamin B12 deficiency anemia due to intrinsic factor deficiency: Secondary | ICD-10-CM | POA: Diagnosis not present

## 2013-03-28 DIAGNOSIS — G608 Other hereditary and idiopathic neuropathies: Secondary | ICD-10-CM | POA: Diagnosis not present

## 2013-03-28 DIAGNOSIS — M159 Polyosteoarthritis, unspecified: Secondary | ICD-10-CM | POA: Diagnosis not present

## 2013-03-28 DIAGNOSIS — N390 Urinary tract infection, site not specified: Secondary | ICD-10-CM | POA: Diagnosis not present

## 2013-03-28 DIAGNOSIS — R32 Unspecified urinary incontinence: Secondary | ICD-10-CM | POA: Diagnosis not present

## 2013-03-28 DIAGNOSIS — I10 Essential (primary) hypertension: Secondary | ICD-10-CM | POA: Diagnosis not present

## 2013-04-11 DIAGNOSIS — I10 Essential (primary) hypertension: Secondary | ICD-10-CM | POA: Diagnosis not present

## 2013-04-11 DIAGNOSIS — D510 Vitamin B12 deficiency anemia due to intrinsic factor deficiency: Secondary | ICD-10-CM | POA: Diagnosis not present

## 2013-04-11 DIAGNOSIS — R32 Unspecified urinary incontinence: Secondary | ICD-10-CM | POA: Diagnosis not present

## 2013-04-11 DIAGNOSIS — N390 Urinary tract infection, site not specified: Secondary | ICD-10-CM | POA: Diagnosis not present

## 2013-04-11 DIAGNOSIS — G608 Other hereditary and idiopathic neuropathies: Secondary | ICD-10-CM | POA: Diagnosis not present

## 2013-04-11 DIAGNOSIS — M159 Polyosteoarthritis, unspecified: Secondary | ICD-10-CM | POA: Diagnosis not present

## 2013-04-13 DIAGNOSIS — Z5181 Encounter for therapeutic drug level monitoring: Secondary | ICD-10-CM | POA: Diagnosis not present

## 2013-04-13 DIAGNOSIS — G608 Other hereditary and idiopathic neuropathies: Secondary | ICD-10-CM | POA: Diagnosis not present

## 2013-04-13 DIAGNOSIS — Z8781 Personal history of (healed) traumatic fracture: Secondary | ICD-10-CM | POA: Diagnosis not present

## 2013-04-13 DIAGNOSIS — R32 Unspecified urinary incontinence: Secondary | ICD-10-CM | POA: Diagnosis not present

## 2013-04-13 DIAGNOSIS — Z9181 History of falling: Secondary | ICD-10-CM | POA: Diagnosis not present

## 2013-04-13 DIAGNOSIS — H409 Unspecified glaucoma: Secondary | ICD-10-CM | POA: Diagnosis not present

## 2013-04-13 DIAGNOSIS — M159 Polyosteoarthritis, unspecified: Secondary | ICD-10-CM | POA: Diagnosis not present

## 2013-04-13 DIAGNOSIS — R609 Edema, unspecified: Secondary | ICD-10-CM | POA: Diagnosis not present

## 2013-04-13 DIAGNOSIS — I10 Essential (primary) hypertension: Secondary | ICD-10-CM | POA: Diagnosis not present

## 2013-04-13 DIAGNOSIS — D510 Vitamin B12 deficiency anemia due to intrinsic factor deficiency: Secondary | ICD-10-CM | POA: Diagnosis not present

## 2013-04-19 DIAGNOSIS — G608 Other hereditary and idiopathic neuropathies: Secondary | ICD-10-CM | POA: Diagnosis not present

## 2013-04-19 DIAGNOSIS — M159 Polyosteoarthritis, unspecified: Secondary | ICD-10-CM | POA: Diagnosis not present

## 2013-04-19 DIAGNOSIS — Z5181 Encounter for therapeutic drug level monitoring: Secondary | ICD-10-CM | POA: Diagnosis not present

## 2013-04-19 DIAGNOSIS — I10 Essential (primary) hypertension: Secondary | ICD-10-CM | POA: Diagnosis not present

## 2013-04-19 DIAGNOSIS — R32 Unspecified urinary incontinence: Secondary | ICD-10-CM | POA: Diagnosis not present

## 2013-04-19 DIAGNOSIS — D510 Vitamin B12 deficiency anemia due to intrinsic factor deficiency: Secondary | ICD-10-CM | POA: Diagnosis not present

## 2013-04-19 DIAGNOSIS — H409 Unspecified glaucoma: Secondary | ICD-10-CM | POA: Diagnosis not present

## 2013-04-19 DIAGNOSIS — R609 Edema, unspecified: Secondary | ICD-10-CM | POA: Diagnosis not present

## 2013-04-28 DIAGNOSIS — G608 Other hereditary and idiopathic neuropathies: Secondary | ICD-10-CM | POA: Diagnosis not present

## 2013-04-28 DIAGNOSIS — R32 Unspecified urinary incontinence: Secondary | ICD-10-CM | POA: Diagnosis not present

## 2013-04-28 DIAGNOSIS — D510 Vitamin B12 deficiency anemia due to intrinsic factor deficiency: Secondary | ICD-10-CM | POA: Diagnosis not present

## 2013-04-28 DIAGNOSIS — I10 Essential (primary) hypertension: Secondary | ICD-10-CM | POA: Diagnosis not present

## 2013-04-28 DIAGNOSIS — M159 Polyosteoarthritis, unspecified: Secondary | ICD-10-CM | POA: Diagnosis not present

## 2013-04-28 DIAGNOSIS — R609 Edema, unspecified: Secondary | ICD-10-CM | POA: Diagnosis not present

## 2013-05-09 DIAGNOSIS — M1991 Primary osteoarthritis, unspecified site: Secondary | ICD-10-CM | POA: Diagnosis not present

## 2013-05-09 DIAGNOSIS — H6120 Impacted cerumen, unspecified ear: Secondary | ICD-10-CM | POA: Diagnosis not present

## 2013-05-09 DIAGNOSIS — H9209 Otalgia, unspecified ear: Secondary | ICD-10-CM | POA: Diagnosis not present

## 2013-05-09 DIAGNOSIS — I10 Essential (primary) hypertension: Secondary | ICD-10-CM | POA: Diagnosis not present

## 2013-05-18 DIAGNOSIS — J019 Acute sinusitis, unspecified: Secondary | ICD-10-CM | POA: Diagnosis not present

## 2013-05-18 DIAGNOSIS — I10 Essential (primary) hypertension: Secondary | ICD-10-CM | POA: Diagnosis not present

## 2013-05-18 DIAGNOSIS — J3089 Other allergic rhinitis: Secondary | ICD-10-CM | POA: Diagnosis not present

## 2013-05-18 DIAGNOSIS — J069 Acute upper respiratory infection, unspecified: Secondary | ICD-10-CM | POA: Diagnosis not present

## 2013-05-22 DIAGNOSIS — F329 Major depressive disorder, single episode, unspecified: Secondary | ICD-10-CM | POA: Diagnosis not present

## 2013-05-22 DIAGNOSIS — M79609 Pain in unspecified limb: Secondary | ICD-10-CM | POA: Diagnosis not present

## 2013-05-22 DIAGNOSIS — Z79899 Other long term (current) drug therapy: Secondary | ICD-10-CM | POA: Diagnosis not present

## 2013-05-22 DIAGNOSIS — M25569 Pain in unspecified knee: Secondary | ICD-10-CM | POA: Diagnosis not present

## 2013-05-22 DIAGNOSIS — F0280 Dementia in other diseases classified elsewhere without behavioral disturbance: Secondary | ICD-10-CM | POA: Diagnosis not present

## 2013-05-22 DIAGNOSIS — M1710 Unilateral primary osteoarthritis, unspecified knee: Secondary | ICD-10-CM | POA: Diagnosis not present

## 2013-05-30 DIAGNOSIS — M159 Polyosteoarthritis, unspecified: Secondary | ICD-10-CM | POA: Diagnosis not present

## 2013-05-30 DIAGNOSIS — G608 Other hereditary and idiopathic neuropathies: Secondary | ICD-10-CM | POA: Diagnosis not present

## 2013-05-30 DIAGNOSIS — R609 Edema, unspecified: Secondary | ICD-10-CM | POA: Diagnosis not present

## 2013-05-30 DIAGNOSIS — R32 Unspecified urinary incontinence: Secondary | ICD-10-CM | POA: Diagnosis not present

## 2013-05-30 DIAGNOSIS — I10 Essential (primary) hypertension: Secondary | ICD-10-CM | POA: Diagnosis not present

## 2013-05-30 DIAGNOSIS — D510 Vitamin B12 deficiency anemia due to intrinsic factor deficiency: Secondary | ICD-10-CM | POA: Diagnosis not present

## 2013-06-29 DIAGNOSIS — M79609 Pain in unspecified limb: Secondary | ICD-10-CM | POA: Diagnosis not present

## 2013-06-29 DIAGNOSIS — B351 Tinea unguium: Secondary | ICD-10-CM | POA: Diagnosis not present

## 2013-07-06 DIAGNOSIS — Z23 Encounter for immunization: Secondary | ICD-10-CM | POA: Diagnosis not present

## 2013-08-08 DIAGNOSIS — M25569 Pain in unspecified knee: Secondary | ICD-10-CM | POA: Diagnosis not present

## 2013-08-08 DIAGNOSIS — M1710 Unilateral primary osteoarthritis, unspecified knee: Secondary | ICD-10-CM | POA: Diagnosis not present

## 2013-08-08 DIAGNOSIS — I10 Essential (primary) hypertension: Secondary | ICD-10-CM | POA: Diagnosis not present

## 2013-08-08 DIAGNOSIS — M159 Polyosteoarthritis, unspecified: Secondary | ICD-10-CM | POA: Diagnosis not present

## 2013-08-10 DIAGNOSIS — M25569 Pain in unspecified knee: Secondary | ICD-10-CM | POA: Diagnosis not present

## 2013-08-10 DIAGNOSIS — IMO0001 Reserved for inherently not codable concepts without codable children: Secondary | ICD-10-CM | POA: Diagnosis not present

## 2013-08-10 DIAGNOSIS — R262 Difficulty in walking, not elsewhere classified: Secondary | ICD-10-CM | POA: Diagnosis not present

## 2013-08-10 DIAGNOSIS — I10 Essential (primary) hypertension: Secondary | ICD-10-CM | POA: Diagnosis not present

## 2013-08-10 DIAGNOSIS — R279 Unspecified lack of coordination: Secondary | ICD-10-CM | POA: Diagnosis not present

## 2013-08-10 DIAGNOSIS — R609 Edema, unspecified: Secondary | ICD-10-CM | POA: Diagnosis not present

## 2013-08-10 DIAGNOSIS — M1710 Unilateral primary osteoarthritis, unspecified knee: Secondary | ICD-10-CM | POA: Diagnosis not present

## 2013-08-10 DIAGNOSIS — IMO0002 Reserved for concepts with insufficient information to code with codable children: Secondary | ICD-10-CM | POA: Diagnosis not present

## 2013-08-16 DIAGNOSIS — R279 Unspecified lack of coordination: Secondary | ICD-10-CM | POA: Diagnosis not present

## 2013-08-16 DIAGNOSIS — R262 Difficulty in walking, not elsewhere classified: Secondary | ICD-10-CM | POA: Diagnosis not present

## 2013-08-16 DIAGNOSIS — R609 Edema, unspecified: Secondary | ICD-10-CM | POA: Diagnosis not present

## 2013-08-16 DIAGNOSIS — M25569 Pain in unspecified knee: Secondary | ICD-10-CM | POA: Diagnosis not present

## 2013-08-16 DIAGNOSIS — M1710 Unilateral primary osteoarthritis, unspecified knee: Secondary | ICD-10-CM | POA: Diagnosis not present

## 2013-08-16 DIAGNOSIS — IMO0001 Reserved for inherently not codable concepts without codable children: Secondary | ICD-10-CM | POA: Diagnosis not present

## 2013-08-18 DIAGNOSIS — M25569 Pain in unspecified knee: Secondary | ICD-10-CM | POA: Diagnosis not present

## 2013-08-18 DIAGNOSIS — R279 Unspecified lack of coordination: Secondary | ICD-10-CM | POA: Diagnosis not present

## 2013-08-18 DIAGNOSIS — M1710 Unilateral primary osteoarthritis, unspecified knee: Secondary | ICD-10-CM | POA: Diagnosis not present

## 2013-08-18 DIAGNOSIS — R262 Difficulty in walking, not elsewhere classified: Secondary | ICD-10-CM | POA: Diagnosis not present

## 2013-08-18 DIAGNOSIS — R609 Edema, unspecified: Secondary | ICD-10-CM | POA: Diagnosis not present

## 2013-08-18 DIAGNOSIS — IMO0001 Reserved for inherently not codable concepts without codable children: Secondary | ICD-10-CM | POA: Diagnosis not present

## 2013-08-20 DIAGNOSIS — M1710 Unilateral primary osteoarthritis, unspecified knee: Secondary | ICD-10-CM | POA: Diagnosis not present

## 2013-08-20 DIAGNOSIS — M25569 Pain in unspecified knee: Secondary | ICD-10-CM | POA: Diagnosis not present

## 2013-08-20 DIAGNOSIS — IMO0001 Reserved for inherently not codable concepts without codable children: Secondary | ICD-10-CM | POA: Diagnosis not present

## 2013-08-20 DIAGNOSIS — R262 Difficulty in walking, not elsewhere classified: Secondary | ICD-10-CM | POA: Diagnosis not present

## 2013-08-20 DIAGNOSIS — R279 Unspecified lack of coordination: Secondary | ICD-10-CM | POA: Diagnosis not present

## 2013-08-20 DIAGNOSIS — R609 Edema, unspecified: Secondary | ICD-10-CM | POA: Diagnosis not present

## 2013-08-22 DIAGNOSIS — M25569 Pain in unspecified knee: Secondary | ICD-10-CM | POA: Diagnosis not present

## 2013-08-22 DIAGNOSIS — R609 Edema, unspecified: Secondary | ICD-10-CM | POA: Diagnosis not present

## 2013-08-22 DIAGNOSIS — R262 Difficulty in walking, not elsewhere classified: Secondary | ICD-10-CM | POA: Diagnosis not present

## 2013-08-22 DIAGNOSIS — M1710 Unilateral primary osteoarthritis, unspecified knee: Secondary | ICD-10-CM | POA: Diagnosis not present

## 2013-08-23 DIAGNOSIS — R279 Unspecified lack of coordination: Secondary | ICD-10-CM | POA: Diagnosis not present

## 2013-08-23 DIAGNOSIS — M25569 Pain in unspecified knee: Secondary | ICD-10-CM | POA: Diagnosis not present

## 2013-08-23 DIAGNOSIS — R262 Difficulty in walking, not elsewhere classified: Secondary | ICD-10-CM | POA: Diagnosis not present

## 2013-08-23 DIAGNOSIS — IMO0001 Reserved for inherently not codable concepts without codable children: Secondary | ICD-10-CM | POA: Diagnosis not present

## 2013-08-23 DIAGNOSIS — R609 Edema, unspecified: Secondary | ICD-10-CM | POA: Diagnosis not present

## 2013-08-23 DIAGNOSIS — M1710 Unilateral primary osteoarthritis, unspecified knee: Secondary | ICD-10-CM | POA: Diagnosis not present

## 2013-08-25 DIAGNOSIS — B029 Zoster without complications: Secondary | ICD-10-CM | POA: Diagnosis not present

## 2013-08-28 DIAGNOSIS — H00039 Abscess of eyelid unspecified eye, unspecified eyelid: Secondary | ICD-10-CM | POA: Diagnosis not present

## 2013-08-29 DIAGNOSIS — R279 Unspecified lack of coordination: Secondary | ICD-10-CM | POA: Diagnosis not present

## 2013-08-29 DIAGNOSIS — R609 Edema, unspecified: Secondary | ICD-10-CM | POA: Diagnosis not present

## 2013-08-29 DIAGNOSIS — M25569 Pain in unspecified knee: Secondary | ICD-10-CM | POA: Diagnosis not present

## 2013-08-29 DIAGNOSIS — M1710 Unilateral primary osteoarthritis, unspecified knee: Secondary | ICD-10-CM | POA: Diagnosis not present

## 2013-08-29 DIAGNOSIS — IMO0001 Reserved for inherently not codable concepts without codable children: Secondary | ICD-10-CM | POA: Diagnosis not present

## 2013-08-29 DIAGNOSIS — R262 Difficulty in walking, not elsewhere classified: Secondary | ICD-10-CM | POA: Diagnosis not present

## 2013-08-30 DIAGNOSIS — M25569 Pain in unspecified knee: Secondary | ICD-10-CM | POA: Diagnosis not present

## 2013-08-30 DIAGNOSIS — IMO0001 Reserved for inherently not codable concepts without codable children: Secondary | ICD-10-CM | POA: Diagnosis not present

## 2013-08-30 DIAGNOSIS — H00039 Abscess of eyelid unspecified eye, unspecified eyelid: Secondary | ICD-10-CM | POA: Diagnosis not present

## 2013-08-30 DIAGNOSIS — M1710 Unilateral primary osteoarthritis, unspecified knee: Secondary | ICD-10-CM | POA: Diagnosis not present

## 2013-08-30 DIAGNOSIS — R279 Unspecified lack of coordination: Secondary | ICD-10-CM | POA: Diagnosis not present

## 2013-08-30 DIAGNOSIS — R609 Edema, unspecified: Secondary | ICD-10-CM | POA: Diagnosis not present

## 2013-08-30 DIAGNOSIS — R262 Difficulty in walking, not elsewhere classified: Secondary | ICD-10-CM | POA: Diagnosis not present

## 2013-09-05 DIAGNOSIS — H00039 Abscess of eyelid unspecified eye, unspecified eyelid: Secondary | ICD-10-CM | POA: Diagnosis not present

## 2013-09-14 DIAGNOSIS — H00039 Abscess of eyelid unspecified eye, unspecified eyelid: Secondary | ICD-10-CM | POA: Diagnosis not present

## 2013-09-25 DIAGNOSIS — F0280 Dementia in other diseases classified elsewhere without behavioral disturbance: Secondary | ICD-10-CM | POA: Diagnosis not present

## 2013-09-25 DIAGNOSIS — M1710 Unilateral primary osteoarthritis, unspecified knee: Secondary | ICD-10-CM | POA: Diagnosis not present

## 2013-09-25 DIAGNOSIS — F3289 Other specified depressive episodes: Secondary | ICD-10-CM | POA: Diagnosis not present

## 2013-09-25 DIAGNOSIS — M159 Polyosteoarthritis, unspecified: Secondary | ICD-10-CM | POA: Diagnosis not present

## 2013-09-25 DIAGNOSIS — M25569 Pain in unspecified knee: Secondary | ICD-10-CM | POA: Diagnosis not present

## 2013-09-25 DIAGNOSIS — B028 Zoster with other complications: Secondary | ICD-10-CM | POA: Diagnosis not present

## 2013-09-25 DIAGNOSIS — Z79899 Other long term (current) drug therapy: Secondary | ICD-10-CM | POA: Diagnosis not present

## 2013-09-25 DIAGNOSIS — G4701 Insomnia due to medical condition: Secondary | ICD-10-CM | POA: Diagnosis not present

## 2013-09-25 DIAGNOSIS — G309 Alzheimer's disease, unspecified: Secondary | ICD-10-CM | POA: Diagnosis not present

## 2013-09-25 DIAGNOSIS — F329 Major depressive disorder, single episode, unspecified: Secondary | ICD-10-CM | POA: Diagnosis not present

## 2013-09-25 DIAGNOSIS — F0390 Unspecified dementia without behavioral disturbance: Secondary | ICD-10-CM | POA: Diagnosis not present

## 2013-09-25 DIAGNOSIS — M79609 Pain in unspecified limb: Secondary | ICD-10-CM | POA: Diagnosis not present

## 2013-10-25 DIAGNOSIS — M79609 Pain in unspecified limb: Secondary | ICD-10-CM | POA: Diagnosis not present

## 2013-10-25 DIAGNOSIS — B351 Tinea unguium: Secondary | ICD-10-CM | POA: Diagnosis not present

## 2013-10-25 DIAGNOSIS — L97509 Non-pressure chronic ulcer of other part of unspecified foot with unspecified severity: Secondary | ICD-10-CM | POA: Diagnosis not present

## 2013-10-28 DIAGNOSIS — K5900 Constipation, unspecified: Secondary | ICD-10-CM | POA: Diagnosis not present

## 2013-10-28 DIAGNOSIS — I10 Essential (primary) hypertension: Secondary | ICD-10-CM | POA: Diagnosis not present

## 2013-10-28 DIAGNOSIS — L989 Disorder of the skin and subcutaneous tissue, unspecified: Secondary | ICD-10-CM | POA: Diagnosis not present

## 2013-10-28 DIAGNOSIS — R32 Unspecified urinary incontinence: Secondary | ICD-10-CM | POA: Diagnosis not present

## 2013-10-28 DIAGNOSIS — Z87440 Personal history of urinary (tract) infections: Secondary | ICD-10-CM | POA: Diagnosis not present

## 2013-10-28 DIAGNOSIS — M1710 Unilateral primary osteoarthritis, unspecified knee: Secondary | ICD-10-CM | POA: Diagnosis not present

## 2013-10-28 DIAGNOSIS — IMO0002 Reserved for concepts with insufficient information to code with codable children: Secondary | ICD-10-CM | POA: Diagnosis not present

## 2013-10-28 DIAGNOSIS — D500 Iron deficiency anemia secondary to blood loss (chronic): Secondary | ICD-10-CM | POA: Diagnosis not present

## 2013-10-28 DIAGNOSIS — F0390 Unspecified dementia without behavioral disturbance: Secondary | ICD-10-CM | POA: Diagnosis not present

## 2013-10-31 DIAGNOSIS — H4011X0 Primary open-angle glaucoma, stage unspecified: Secondary | ICD-10-CM | POA: Diagnosis not present

## 2013-10-31 DIAGNOSIS — H409 Unspecified glaucoma: Secondary | ICD-10-CM | POA: Diagnosis not present

## 2013-11-01 DIAGNOSIS — D500 Iron deficiency anemia secondary to blood loss (chronic): Secondary | ICD-10-CM | POA: Diagnosis not present

## 2013-11-01 DIAGNOSIS — I10 Essential (primary) hypertension: Secondary | ICD-10-CM | POA: Diagnosis not present

## 2013-11-01 DIAGNOSIS — K5900 Constipation, unspecified: Secondary | ICD-10-CM | POA: Diagnosis not present

## 2013-11-01 DIAGNOSIS — M1710 Unilateral primary osteoarthritis, unspecified knee: Secondary | ICD-10-CM | POA: Diagnosis not present

## 2013-11-01 DIAGNOSIS — F0390 Unspecified dementia without behavioral disturbance: Secondary | ICD-10-CM | POA: Diagnosis not present

## 2013-11-01 DIAGNOSIS — L989 Disorder of the skin and subcutaneous tissue, unspecified: Secondary | ICD-10-CM | POA: Diagnosis not present

## 2013-11-01 DIAGNOSIS — IMO0002 Reserved for concepts with insufficient information to code with codable children: Secondary | ICD-10-CM | POA: Diagnosis not present

## 2013-11-02 DIAGNOSIS — K5900 Constipation, unspecified: Secondary | ICD-10-CM | POA: Diagnosis not present

## 2013-11-02 DIAGNOSIS — F0390 Unspecified dementia without behavioral disturbance: Secondary | ICD-10-CM | POA: Diagnosis not present

## 2013-11-02 DIAGNOSIS — D500 Iron deficiency anemia secondary to blood loss (chronic): Secondary | ICD-10-CM | POA: Diagnosis not present

## 2013-11-02 DIAGNOSIS — Z87440 Personal history of urinary (tract) infections: Secondary | ICD-10-CM | POA: Diagnosis not present

## 2013-11-02 DIAGNOSIS — R32 Unspecified urinary incontinence: Secondary | ICD-10-CM | POA: Diagnosis not present

## 2013-11-02 DIAGNOSIS — I10 Essential (primary) hypertension: Secondary | ICD-10-CM | POA: Diagnosis not present

## 2013-11-02 DIAGNOSIS — IMO0002 Reserved for concepts with insufficient information to code with codable children: Secondary | ICD-10-CM | POA: Diagnosis not present

## 2013-11-02 DIAGNOSIS — M1710 Unilateral primary osteoarthritis, unspecified knee: Secondary | ICD-10-CM | POA: Diagnosis not present

## 2013-11-02 DIAGNOSIS — L989 Disorder of the skin and subcutaneous tissue, unspecified: Secondary | ICD-10-CM | POA: Diagnosis not present

## 2013-11-03 DIAGNOSIS — IMO0002 Reserved for concepts with insufficient information to code with codable children: Secondary | ICD-10-CM | POA: Diagnosis not present

## 2013-11-03 DIAGNOSIS — L989 Disorder of the skin and subcutaneous tissue, unspecified: Secondary | ICD-10-CM | POA: Diagnosis not present

## 2013-11-03 DIAGNOSIS — M1710 Unilateral primary osteoarthritis, unspecified knee: Secondary | ICD-10-CM | POA: Diagnosis not present

## 2013-11-03 DIAGNOSIS — F0390 Unspecified dementia without behavioral disturbance: Secondary | ICD-10-CM | POA: Diagnosis not present

## 2013-11-03 DIAGNOSIS — D500 Iron deficiency anemia secondary to blood loss (chronic): Secondary | ICD-10-CM | POA: Diagnosis not present

## 2013-11-03 DIAGNOSIS — I10 Essential (primary) hypertension: Secondary | ICD-10-CM | POA: Diagnosis not present

## 2013-11-03 DIAGNOSIS — K5900 Constipation, unspecified: Secondary | ICD-10-CM | POA: Diagnosis not present

## 2013-11-08 DIAGNOSIS — M1710 Unilateral primary osteoarthritis, unspecified knee: Secondary | ICD-10-CM | POA: Diagnosis not present

## 2013-11-08 DIAGNOSIS — L989 Disorder of the skin and subcutaneous tissue, unspecified: Secondary | ICD-10-CM | POA: Diagnosis not present

## 2013-11-08 DIAGNOSIS — IMO0002 Reserved for concepts with insufficient information to code with codable children: Secondary | ICD-10-CM | POA: Diagnosis not present

## 2013-11-08 DIAGNOSIS — K5900 Constipation, unspecified: Secondary | ICD-10-CM | POA: Diagnosis not present

## 2013-11-08 DIAGNOSIS — F0390 Unspecified dementia without behavioral disturbance: Secondary | ICD-10-CM | POA: Diagnosis not present

## 2013-11-08 DIAGNOSIS — D500 Iron deficiency anemia secondary to blood loss (chronic): Secondary | ICD-10-CM | POA: Diagnosis not present

## 2013-11-08 DIAGNOSIS — I10 Essential (primary) hypertension: Secondary | ICD-10-CM | POA: Diagnosis not present

## 2013-11-10 DIAGNOSIS — IMO0002 Reserved for concepts with insufficient information to code with codable children: Secondary | ICD-10-CM | POA: Diagnosis not present

## 2013-11-10 DIAGNOSIS — L989 Disorder of the skin and subcutaneous tissue, unspecified: Secondary | ICD-10-CM | POA: Diagnosis not present

## 2013-11-10 DIAGNOSIS — F0390 Unspecified dementia without behavioral disturbance: Secondary | ICD-10-CM | POA: Diagnosis not present

## 2013-11-10 DIAGNOSIS — D500 Iron deficiency anemia secondary to blood loss (chronic): Secondary | ICD-10-CM | POA: Diagnosis not present

## 2013-11-10 DIAGNOSIS — M1710 Unilateral primary osteoarthritis, unspecified knee: Secondary | ICD-10-CM | POA: Diagnosis not present

## 2013-11-10 DIAGNOSIS — I10 Essential (primary) hypertension: Secondary | ICD-10-CM | POA: Diagnosis not present

## 2013-11-10 DIAGNOSIS — K5900 Constipation, unspecified: Secondary | ICD-10-CM | POA: Diagnosis not present

## 2013-11-14 DIAGNOSIS — J209 Acute bronchitis, unspecified: Secondary | ICD-10-CM | POA: Diagnosis not present

## 2013-11-14 DIAGNOSIS — I10 Essential (primary) hypertension: Secondary | ICD-10-CM | POA: Diagnosis not present

## 2013-11-14 DIAGNOSIS — M159 Polyosteoarthritis, unspecified: Secondary | ICD-10-CM | POA: Diagnosis not present

## 2013-11-14 DIAGNOSIS — F0390 Unspecified dementia without behavioral disturbance: Secondary | ICD-10-CM | POA: Diagnosis not present

## 2013-11-14 DIAGNOSIS — J3089 Other allergic rhinitis: Secondary | ICD-10-CM | POA: Diagnosis not present

## 2013-11-15 DIAGNOSIS — I10 Essential (primary) hypertension: Secondary | ICD-10-CM | POA: Diagnosis not present

## 2013-11-15 DIAGNOSIS — D500 Iron deficiency anemia secondary to blood loss (chronic): Secondary | ICD-10-CM | POA: Diagnosis not present

## 2013-11-15 DIAGNOSIS — F0390 Unspecified dementia without behavioral disturbance: Secondary | ICD-10-CM | POA: Diagnosis not present

## 2013-11-15 DIAGNOSIS — IMO0002 Reserved for concepts with insufficient information to code with codable children: Secondary | ICD-10-CM | POA: Diagnosis not present

## 2013-11-15 DIAGNOSIS — K5900 Constipation, unspecified: Secondary | ICD-10-CM | POA: Diagnosis not present

## 2013-11-15 DIAGNOSIS — M1710 Unilateral primary osteoarthritis, unspecified knee: Secondary | ICD-10-CM | POA: Diagnosis not present

## 2013-11-15 DIAGNOSIS — L989 Disorder of the skin and subcutaneous tissue, unspecified: Secondary | ICD-10-CM | POA: Diagnosis not present

## 2013-11-22 DIAGNOSIS — I10 Essential (primary) hypertension: Secondary | ICD-10-CM | POA: Diagnosis not present

## 2013-11-22 DIAGNOSIS — L989 Disorder of the skin and subcutaneous tissue, unspecified: Secondary | ICD-10-CM | POA: Diagnosis not present

## 2013-11-22 DIAGNOSIS — M1710 Unilateral primary osteoarthritis, unspecified knee: Secondary | ICD-10-CM | POA: Diagnosis not present

## 2013-11-22 DIAGNOSIS — K5900 Constipation, unspecified: Secondary | ICD-10-CM | POA: Diagnosis not present

## 2013-11-22 DIAGNOSIS — F0390 Unspecified dementia without behavioral disturbance: Secondary | ICD-10-CM | POA: Diagnosis not present

## 2013-11-22 DIAGNOSIS — D500 Iron deficiency anemia secondary to blood loss (chronic): Secondary | ICD-10-CM | POA: Diagnosis not present

## 2013-11-22 DIAGNOSIS — IMO0002 Reserved for concepts with insufficient information to code with codable children: Secondary | ICD-10-CM | POA: Diagnosis not present

## 2013-11-28 ENCOUNTER — Ambulatory Visit: Payer: Self-pay | Admitting: Internal Medicine

## 2013-11-28 DIAGNOSIS — R922 Inconclusive mammogram: Secondary | ICD-10-CM | POA: Diagnosis not present

## 2013-11-28 DIAGNOSIS — Z1231 Encounter for screening mammogram for malignant neoplasm of breast: Secondary | ICD-10-CM | POA: Diagnosis not present

## 2013-12-04 DIAGNOSIS — G309 Alzheimer's disease, unspecified: Secondary | ICD-10-CM | POA: Diagnosis not present

## 2013-12-04 DIAGNOSIS — F3289 Other specified depressive episodes: Secondary | ICD-10-CM | POA: Diagnosis not present

## 2013-12-04 DIAGNOSIS — M25569 Pain in unspecified knee: Secondary | ICD-10-CM | POA: Diagnosis not present

## 2013-12-04 DIAGNOSIS — F0280 Dementia in other diseases classified elsewhere without behavioral disturbance: Secondary | ICD-10-CM | POA: Diagnosis not present

## 2013-12-04 DIAGNOSIS — F329 Major depressive disorder, single episode, unspecified: Secondary | ICD-10-CM | POA: Diagnosis not present

## 2013-12-04 DIAGNOSIS — M1710 Unilateral primary osteoarthritis, unspecified knee: Secondary | ICD-10-CM | POA: Diagnosis not present

## 2013-12-04 DIAGNOSIS — Z79899 Other long term (current) drug therapy: Secondary | ICD-10-CM | POA: Diagnosis not present

## 2013-12-04 DIAGNOSIS — M79609 Pain in unspecified limb: Secondary | ICD-10-CM | POA: Diagnosis not present

## 2013-12-07 DIAGNOSIS — L97509 Non-pressure chronic ulcer of other part of unspecified foot with unspecified severity: Secondary | ICD-10-CM | POA: Diagnosis not present

## 2013-12-14 ENCOUNTER — Ambulatory Visit: Payer: Self-pay | Admitting: Internal Medicine

## 2013-12-14 DIAGNOSIS — D249 Benign neoplasm of unspecified breast: Secondary | ICD-10-CM | POA: Diagnosis not present

## 2013-12-14 DIAGNOSIS — N63 Unspecified lump in unspecified breast: Secondary | ICD-10-CM | POA: Diagnosis not present

## 2014-01-18 DIAGNOSIS — F0390 Unspecified dementia without behavioral disturbance: Secondary | ICD-10-CM | POA: Diagnosis not present

## 2014-01-18 DIAGNOSIS — R269 Unspecified abnormalities of gait and mobility: Secondary | ICD-10-CM | POA: Diagnosis not present

## 2014-01-18 DIAGNOSIS — M1710 Unilateral primary osteoarthritis, unspecified knee: Secondary | ICD-10-CM | POA: Diagnosis not present

## 2014-01-18 DIAGNOSIS — D649 Anemia, unspecified: Secondary | ICD-10-CM | POA: Diagnosis not present

## 2014-01-18 DIAGNOSIS — B0229 Other postherpetic nervous system involvement: Secondary | ICD-10-CM | POA: Diagnosis not present

## 2014-01-29 ENCOUNTER — Ambulatory Visit: Payer: Self-pay | Admitting: Pain Medicine

## 2014-01-29 DIAGNOSIS — B0229 Other postherpetic nervous system involvement: Secondary | ICD-10-CM | POA: Diagnosis not present

## 2014-01-29 DIAGNOSIS — B0223 Postherpetic polyneuropathy: Secondary | ICD-10-CM | POA: Diagnosis not present

## 2014-01-29 DIAGNOSIS — IMO0002 Reserved for concepts with insufficient information to code with codable children: Secondary | ICD-10-CM | POA: Diagnosis not present

## 2014-02-21 DIAGNOSIS — B0229 Other postherpetic nervous system involvement: Secondary | ICD-10-CM | POA: Diagnosis not present

## 2014-02-21 DIAGNOSIS — F0390 Unspecified dementia without behavioral disturbance: Secondary | ICD-10-CM | POA: Diagnosis not present

## 2014-02-28 DIAGNOSIS — H4011X0 Primary open-angle glaucoma, stage unspecified: Secondary | ICD-10-CM | POA: Diagnosis not present

## 2014-02-28 DIAGNOSIS — H409 Unspecified glaucoma: Secondary | ICD-10-CM | POA: Diagnosis not present

## 2014-03-07 ENCOUNTER — Ambulatory Visit: Payer: Self-pay | Admitting: Pain Medicine

## 2014-03-07 DIAGNOSIS — B0229 Other postherpetic nervous system involvement: Secondary | ICD-10-CM | POA: Diagnosis not present

## 2014-03-07 DIAGNOSIS — M62838 Other muscle spasm: Secondary | ICD-10-CM | POA: Diagnosis not present

## 2014-03-07 DIAGNOSIS — B0223 Postherpetic polyneuropathy: Secondary | ICD-10-CM | POA: Diagnosis not present

## 2014-03-19 ENCOUNTER — Ambulatory Visit: Payer: Self-pay | Admitting: Pain Medicine

## 2014-03-19 DIAGNOSIS — M542 Cervicalgia: Secondary | ICD-10-CM | POA: Diagnosis not present

## 2014-03-19 DIAGNOSIS — IMO0002 Reserved for concepts with insufficient information to code with codable children: Secondary | ICD-10-CM | POA: Diagnosis not present

## 2014-03-19 DIAGNOSIS — B0229 Other postherpetic nervous system involvement: Secondary | ICD-10-CM | POA: Diagnosis not present

## 2014-03-19 DIAGNOSIS — Z79899 Other long term (current) drug therapy: Secondary | ICD-10-CM | POA: Diagnosis not present

## 2014-03-19 DIAGNOSIS — B0223 Postherpetic polyneuropathy: Secondary | ICD-10-CM | POA: Diagnosis not present

## 2014-03-21 DIAGNOSIS — F3289 Other specified depressive episodes: Secondary | ICD-10-CM | POA: Diagnosis not present

## 2014-03-21 DIAGNOSIS — M1710 Unilateral primary osteoarthritis, unspecified knee: Secondary | ICD-10-CM | POA: Diagnosis not present

## 2014-03-21 DIAGNOSIS — Z9181 History of falling: Secondary | ICD-10-CM | POA: Diagnosis not present

## 2014-03-21 DIAGNOSIS — I10 Essential (primary) hypertension: Secondary | ICD-10-CM | POA: Diagnosis not present

## 2014-03-21 DIAGNOSIS — F329 Major depressive disorder, single episode, unspecified: Secondary | ICD-10-CM | POA: Diagnosis not present

## 2014-03-21 DIAGNOSIS — IMO0002 Reserved for concepts with insufficient information to code with codable children: Secondary | ICD-10-CM | POA: Diagnosis not present

## 2014-03-21 DIAGNOSIS — B0229 Other postherpetic nervous system involvement: Secondary | ICD-10-CM | POA: Diagnosis not present

## 2014-03-27 DIAGNOSIS — F3289 Other specified depressive episodes: Secondary | ICD-10-CM | POA: Diagnosis not present

## 2014-03-27 DIAGNOSIS — M1710 Unilateral primary osteoarthritis, unspecified knee: Secondary | ICD-10-CM | POA: Diagnosis not present

## 2014-03-27 DIAGNOSIS — I10 Essential (primary) hypertension: Secondary | ICD-10-CM | POA: Diagnosis not present

## 2014-03-27 DIAGNOSIS — IMO0002 Reserved for concepts with insufficient information to code with codable children: Secondary | ICD-10-CM | POA: Diagnosis not present

## 2014-03-27 DIAGNOSIS — Z9181 History of falling: Secondary | ICD-10-CM | POA: Diagnosis not present

## 2014-03-27 DIAGNOSIS — B0229 Other postherpetic nervous system involvement: Secondary | ICD-10-CM | POA: Diagnosis not present

## 2014-03-27 DIAGNOSIS — F329 Major depressive disorder, single episode, unspecified: Secondary | ICD-10-CM | POA: Diagnosis not present

## 2014-03-30 DIAGNOSIS — I10 Essential (primary) hypertension: Secondary | ICD-10-CM | POA: Diagnosis not present

## 2014-03-30 DIAGNOSIS — B0229 Other postherpetic nervous system involvement: Secondary | ICD-10-CM | POA: Diagnosis not present

## 2014-03-30 DIAGNOSIS — F329 Major depressive disorder, single episode, unspecified: Secondary | ICD-10-CM | POA: Diagnosis not present

## 2014-03-30 DIAGNOSIS — Z9181 History of falling: Secondary | ICD-10-CM | POA: Diagnosis not present

## 2014-03-30 DIAGNOSIS — IMO0002 Reserved for concepts with insufficient information to code with codable children: Secondary | ICD-10-CM | POA: Diagnosis not present

## 2014-03-30 DIAGNOSIS — F3289 Other specified depressive episodes: Secondary | ICD-10-CM | POA: Diagnosis not present

## 2014-03-30 DIAGNOSIS — M1710 Unilateral primary osteoarthritis, unspecified knee: Secondary | ICD-10-CM | POA: Diagnosis not present

## 2014-04-03 DIAGNOSIS — M1710 Unilateral primary osteoarthritis, unspecified knee: Secondary | ICD-10-CM | POA: Diagnosis not present

## 2014-04-03 DIAGNOSIS — IMO0002 Reserved for concepts with insufficient information to code with codable children: Secondary | ICD-10-CM | POA: Diagnosis not present

## 2014-04-03 DIAGNOSIS — B0229 Other postherpetic nervous system involvement: Secondary | ICD-10-CM | POA: Diagnosis not present

## 2014-04-03 DIAGNOSIS — I10 Essential (primary) hypertension: Secondary | ICD-10-CM | POA: Diagnosis not present

## 2014-04-03 DIAGNOSIS — Z9181 History of falling: Secondary | ICD-10-CM | POA: Diagnosis not present

## 2014-04-03 DIAGNOSIS — F3289 Other specified depressive episodes: Secondary | ICD-10-CM | POA: Diagnosis not present

## 2014-04-03 DIAGNOSIS — F329 Major depressive disorder, single episode, unspecified: Secondary | ICD-10-CM | POA: Diagnosis not present

## 2014-04-04 ENCOUNTER — Ambulatory Visit: Payer: Self-pay | Admitting: Pain Medicine

## 2014-04-04 DIAGNOSIS — B0229 Other postherpetic nervous system involvement: Secondary | ICD-10-CM | POA: Diagnosis not present

## 2014-04-04 DIAGNOSIS — M531 Cervicobrachial syndrome: Secondary | ICD-10-CM | POA: Diagnosis not present

## 2014-04-04 DIAGNOSIS — Z79899 Other long term (current) drug therapy: Secondary | ICD-10-CM | POA: Diagnosis not present

## 2014-04-10 DIAGNOSIS — B0229 Other postherpetic nervous system involvement: Secondary | ICD-10-CM | POA: Diagnosis not present

## 2014-04-10 DIAGNOSIS — Z9181 History of falling: Secondary | ICD-10-CM | POA: Diagnosis not present

## 2014-04-10 DIAGNOSIS — IMO0002 Reserved for concepts with insufficient information to code with codable children: Secondary | ICD-10-CM | POA: Diagnosis not present

## 2014-04-10 DIAGNOSIS — F3289 Other specified depressive episodes: Secondary | ICD-10-CM | POA: Diagnosis not present

## 2014-04-10 DIAGNOSIS — F329 Major depressive disorder, single episode, unspecified: Secondary | ICD-10-CM | POA: Diagnosis not present

## 2014-04-10 DIAGNOSIS — M1710 Unilateral primary osteoarthritis, unspecified knee: Secondary | ICD-10-CM | POA: Diagnosis not present

## 2014-04-10 DIAGNOSIS — I10 Essential (primary) hypertension: Secondary | ICD-10-CM | POA: Diagnosis not present

## 2014-04-17 ENCOUNTER — Ambulatory Visit: Payer: Self-pay | Admitting: Pain Medicine

## 2014-04-17 DIAGNOSIS — Z79899 Other long term (current) drug therapy: Secondary | ICD-10-CM | POA: Diagnosis not present

## 2014-04-17 DIAGNOSIS — B0223 Postherpetic polyneuropathy: Secondary | ICD-10-CM | POA: Diagnosis not present

## 2014-04-17 DIAGNOSIS — IMO0002 Reserved for concepts with insufficient information to code with codable children: Secondary | ICD-10-CM | POA: Diagnosis not present

## 2014-04-17 DIAGNOSIS — IMO0001 Reserved for inherently not codable concepts without codable children: Secondary | ICD-10-CM | POA: Diagnosis not present

## 2014-04-17 DIAGNOSIS — B0229 Other postherpetic nervous system involvement: Secondary | ICD-10-CM | POA: Diagnosis not present

## 2014-04-17 DIAGNOSIS — M531 Cervicobrachial syndrome: Secondary | ICD-10-CM | POA: Diagnosis not present

## 2014-04-25 ENCOUNTER — Ambulatory Visit: Payer: Self-pay | Admitting: Pain Medicine

## 2014-04-25 DIAGNOSIS — M531 Cervicobrachial syndrome: Secondary | ICD-10-CM | POA: Diagnosis not present

## 2014-04-25 DIAGNOSIS — R51 Headache: Secondary | ICD-10-CM | POA: Diagnosis not present

## 2014-04-25 DIAGNOSIS — B0229 Other postherpetic nervous system involvement: Secondary | ICD-10-CM | POA: Diagnosis not present

## 2014-05-08 DIAGNOSIS — I10 Essential (primary) hypertension: Secondary | ICD-10-CM | POA: Diagnosis not present

## 2014-05-08 DIAGNOSIS — M1710 Unilateral primary osteoarthritis, unspecified knee: Secondary | ICD-10-CM | POA: Diagnosis not present

## 2014-05-08 DIAGNOSIS — H6120 Impacted cerumen, unspecified ear: Secondary | ICD-10-CM | POA: Diagnosis not present

## 2014-05-08 DIAGNOSIS — R350 Frequency of micturition: Secondary | ICD-10-CM | POA: Diagnosis not present

## 2014-05-28 ENCOUNTER — Ambulatory Visit: Payer: Self-pay | Admitting: Pain Medicine

## 2014-05-28 DIAGNOSIS — IMO0001 Reserved for inherently not codable concepts without codable children: Secondary | ICD-10-CM | POA: Diagnosis not present

## 2014-05-28 DIAGNOSIS — M542 Cervicalgia: Secondary | ICD-10-CM | POA: Diagnosis not present

## 2014-05-28 DIAGNOSIS — M531 Cervicobrachial syndrome: Secondary | ICD-10-CM | POA: Diagnosis not present

## 2014-05-28 DIAGNOSIS — B0223 Postherpetic polyneuropathy: Secondary | ICD-10-CM | POA: Diagnosis not present

## 2014-05-28 DIAGNOSIS — R51 Headache: Secondary | ICD-10-CM | POA: Diagnosis not present

## 2014-05-31 DIAGNOSIS — H4011X3 Primary open-angle glaucoma, severe stage: Secondary | ICD-10-CM | POA: Diagnosis not present

## 2014-06-13 ENCOUNTER — Ambulatory Visit: Payer: Self-pay | Admitting: Pain Medicine

## 2014-06-13 DIAGNOSIS — Z23 Encounter for immunization: Secondary | ICD-10-CM | POA: Diagnosis not present

## 2014-06-13 DIAGNOSIS — R51 Headache: Secondary | ICD-10-CM | POA: Diagnosis not present

## 2014-06-13 DIAGNOSIS — M792 Neuralgia and neuritis, unspecified: Secondary | ICD-10-CM | POA: Diagnosis not present

## 2014-06-26 ENCOUNTER — Ambulatory Visit: Payer: Self-pay | Admitting: Pain Medicine

## 2014-06-26 DIAGNOSIS — R51 Headache: Secondary | ICD-10-CM | POA: Diagnosis not present

## 2014-06-26 DIAGNOSIS — B0223 Postherpetic polyneuropathy: Secondary | ICD-10-CM | POA: Diagnosis not present

## 2014-06-26 DIAGNOSIS — M792 Neuralgia and neuritis, unspecified: Secondary | ICD-10-CM | POA: Diagnosis not present

## 2014-06-26 DIAGNOSIS — M542 Cervicalgia: Secondary | ICD-10-CM | POA: Diagnosis not present

## 2014-06-26 DIAGNOSIS — M791 Myalgia: Secondary | ICD-10-CM | POA: Diagnosis not present

## 2014-07-13 DIAGNOSIS — M1711 Unilateral primary osteoarthritis, right knee: Secondary | ICD-10-CM | POA: Diagnosis not present

## 2014-07-13 DIAGNOSIS — M25561 Pain in right knee: Secondary | ICD-10-CM | POA: Diagnosis not present

## 2014-07-13 DIAGNOSIS — M25461 Effusion, right knee: Secondary | ICD-10-CM | POA: Diagnosis not present

## 2014-07-16 ENCOUNTER — Ambulatory Visit: Payer: Self-pay | Admitting: Pain Medicine

## 2014-07-16 DIAGNOSIS — M542 Cervicalgia: Secondary | ICD-10-CM | POA: Diagnosis not present

## 2014-07-16 DIAGNOSIS — R51 Headache: Secondary | ICD-10-CM | POA: Diagnosis not present

## 2014-07-16 DIAGNOSIS — B0223 Postherpetic polyneuropathy: Secondary | ICD-10-CM | POA: Diagnosis not present

## 2014-08-14 ENCOUNTER — Ambulatory Visit: Payer: Self-pay | Admitting: Pain Medicine

## 2014-08-14 DIAGNOSIS — B0223 Postherpetic polyneuropathy: Secondary | ICD-10-CM | POA: Diagnosis not present

## 2014-08-14 DIAGNOSIS — M179 Osteoarthritis of knee, unspecified: Secondary | ICD-10-CM | POA: Diagnosis not present

## 2014-08-14 DIAGNOSIS — R51 Headache: Secondary | ICD-10-CM | POA: Diagnosis not present

## 2014-08-14 DIAGNOSIS — M5481 Occipital neuralgia: Secondary | ICD-10-CM | POA: Diagnosis not present

## 2014-08-14 DIAGNOSIS — B0229 Other postherpetic nervous system involvement: Secondary | ICD-10-CM | POA: Diagnosis not present

## 2014-08-14 DIAGNOSIS — M170 Bilateral primary osteoarthritis of knee: Secondary | ICD-10-CM | POA: Diagnosis not present

## 2014-09-18 ENCOUNTER — Ambulatory Visit: Payer: Self-pay | Admitting: Pain Medicine

## 2014-09-18 DIAGNOSIS — M170 Bilateral primary osteoarthritis of knee: Secondary | ICD-10-CM | POA: Diagnosis not present

## 2014-09-18 DIAGNOSIS — B0223 Postherpetic polyneuropathy: Secondary | ICD-10-CM | POA: Diagnosis not present

## 2014-09-18 DIAGNOSIS — R51 Headache: Secondary | ICD-10-CM | POA: Diagnosis not present

## 2014-09-18 DIAGNOSIS — M5481 Occipital neuralgia: Secondary | ICD-10-CM | POA: Diagnosis not present

## 2014-09-26 DIAGNOSIS — I10 Essential (primary) hypertension: Secondary | ICD-10-CM | POA: Diagnosis not present

## 2014-09-26 DIAGNOSIS — D649 Anemia, unspecified: Secondary | ICD-10-CM | POA: Diagnosis not present

## 2014-10-01 DIAGNOSIS — G459 Transient cerebral ischemic attack, unspecified: Secondary | ICD-10-CM

## 2014-10-01 HISTORY — DX: Transient cerebral ischemic attack, unspecified: G45.9

## 2014-10-18 ENCOUNTER — Ambulatory Visit: Payer: Self-pay | Admitting: Pain Medicine

## 2014-10-18 DIAGNOSIS — M542 Cervicalgia: Secondary | ICD-10-CM | POA: Diagnosis not present

## 2014-10-18 DIAGNOSIS — R51 Headache: Secondary | ICD-10-CM | POA: Diagnosis not present

## 2014-10-18 DIAGNOSIS — M170 Bilateral primary osteoarthritis of knee: Secondary | ICD-10-CM | POA: Diagnosis not present

## 2014-10-18 DIAGNOSIS — B0223 Postherpetic polyneuropathy: Secondary | ICD-10-CM | POA: Diagnosis not present

## 2014-10-18 DIAGNOSIS — M179 Osteoarthritis of knee, unspecified: Secondary | ICD-10-CM | POA: Diagnosis not present

## 2014-10-18 DIAGNOSIS — M5481 Occipital neuralgia: Secondary | ICD-10-CM | POA: Diagnosis not present

## 2014-10-19 DIAGNOSIS — J309 Allergic rhinitis, unspecified: Secondary | ICD-10-CM | POA: Diagnosis not present

## 2014-10-19 DIAGNOSIS — H6123 Impacted cerumen, bilateral: Secondary | ICD-10-CM | POA: Diagnosis not present

## 2014-10-19 DIAGNOSIS — I10 Essential (primary) hypertension: Secondary | ICD-10-CM | POA: Diagnosis not present

## 2014-10-19 DIAGNOSIS — M179 Osteoarthritis of knee, unspecified: Secondary | ICD-10-CM | POA: Diagnosis not present

## 2014-10-19 DIAGNOSIS — D649 Anemia, unspecified: Secondary | ICD-10-CM | POA: Diagnosis not present

## 2014-10-19 DIAGNOSIS — K5900 Constipation, unspecified: Secondary | ICD-10-CM | POA: Diagnosis not present

## 2014-10-19 DIAGNOSIS — F0390 Unspecified dementia without behavioral disturbance: Secondary | ICD-10-CM | POA: Diagnosis not present

## 2014-10-22 DIAGNOSIS — F0390 Unspecified dementia without behavioral disturbance: Secondary | ICD-10-CM | POA: Diagnosis not present

## 2014-10-22 DIAGNOSIS — I509 Heart failure, unspecified: Secondary | ICD-10-CM | POA: Diagnosis not present

## 2014-10-22 DIAGNOSIS — I491 Atrial premature depolarization: Secondary | ICD-10-CM | POA: Diagnosis not present

## 2014-10-22 DIAGNOSIS — G8929 Other chronic pain: Secondary | ICD-10-CM | POA: Diagnosis not present

## 2014-10-22 DIAGNOSIS — R4701 Aphasia: Secondary | ICD-10-CM | POA: Diagnosis not present

## 2014-10-22 DIAGNOSIS — M19019 Primary osteoarthritis, unspecified shoulder: Secondary | ICD-10-CM | POA: Diagnosis not present

## 2014-10-22 DIAGNOSIS — R479 Unspecified speech disturbances: Secondary | ICD-10-CM | POA: Diagnosis not present

## 2014-10-22 DIAGNOSIS — G459 Transient cerebral ischemic attack, unspecified: Secondary | ICD-10-CM | POA: Diagnosis not present

## 2014-10-22 DIAGNOSIS — R4182 Altered mental status, unspecified: Secondary | ICD-10-CM | POA: Diagnosis not present

## 2014-10-22 DIAGNOSIS — R531 Weakness: Secondary | ICD-10-CM | POA: Diagnosis not present

## 2014-10-22 DIAGNOSIS — I10 Essential (primary) hypertension: Secondary | ICD-10-CM | POA: Diagnosis not present

## 2014-10-22 DIAGNOSIS — Z01818 Encounter for other preprocedural examination: Secondary | ICD-10-CM | POA: Diagnosis not present

## 2014-10-22 DIAGNOSIS — I6523 Occlusion and stenosis of bilateral carotid arteries: Secondary | ICD-10-CM | POA: Diagnosis not present

## 2014-10-22 DIAGNOSIS — Z79899 Other long term (current) drug therapy: Secondary | ICD-10-CM | POA: Diagnosis not present

## 2014-10-22 DIAGNOSIS — H409 Unspecified glaucoma: Secondary | ICD-10-CM | POA: Diagnosis not present

## 2014-10-22 DIAGNOSIS — M6281 Muscle weakness (generalized): Secondary | ICD-10-CM | POA: Diagnosis not present

## 2014-10-22 DIAGNOSIS — I639 Cerebral infarction, unspecified: Secondary | ICD-10-CM | POA: Diagnosis not present

## 2014-10-31 DIAGNOSIS — D649 Anemia, unspecified: Secondary | ICD-10-CM | POA: Diagnosis not present

## 2014-10-31 DIAGNOSIS — G549 Nerve root and plexus disorder, unspecified: Secondary | ICD-10-CM | POA: Diagnosis not present

## 2014-10-31 DIAGNOSIS — I10 Essential (primary) hypertension: Secondary | ICD-10-CM | POA: Diagnosis not present

## 2014-11-13 ENCOUNTER — Ambulatory Visit: Payer: Self-pay | Admitting: Pain Medicine

## 2014-11-13 DIAGNOSIS — B0223 Postherpetic polyneuropathy: Secondary | ICD-10-CM | POA: Diagnosis not present

## 2014-11-13 DIAGNOSIS — F0390 Unspecified dementia without behavioral disturbance: Secondary | ICD-10-CM | POA: Diagnosis not present

## 2014-11-13 DIAGNOSIS — Z5189 Encounter for other specified aftercare: Secondary | ICD-10-CM | POA: Diagnosis not present

## 2014-11-13 DIAGNOSIS — M170 Bilateral primary osteoarthritis of knee: Secondary | ICD-10-CM | POA: Diagnosis not present

## 2014-11-13 DIAGNOSIS — G459 Transient cerebral ischemic attack, unspecified: Secondary | ICD-10-CM | POA: Diagnosis not present

## 2014-11-13 DIAGNOSIS — M5481 Occipital neuralgia: Secondary | ICD-10-CM | POA: Diagnosis not present

## 2014-11-13 DIAGNOSIS — I10 Essential (primary) hypertension: Secondary | ICD-10-CM | POA: Diagnosis not present

## 2014-11-15 DIAGNOSIS — Z8673 Personal history of transient ischemic attack (TIA), and cerebral infarction without residual deficits: Secondary | ICD-10-CM | POA: Diagnosis not present

## 2014-11-15 DIAGNOSIS — Z9181 History of falling: Secondary | ICD-10-CM | POA: Diagnosis not present

## 2014-11-15 DIAGNOSIS — I10 Essential (primary) hypertension: Secondary | ICD-10-CM | POA: Diagnosis not present

## 2014-11-15 DIAGNOSIS — F0390 Unspecified dementia without behavioral disturbance: Secondary | ICD-10-CM | POA: Diagnosis not present

## 2014-11-15 DIAGNOSIS — H409 Unspecified glaucoma: Secondary | ICD-10-CM | POA: Diagnosis not present

## 2014-11-16 ENCOUNTER — Ambulatory Visit: Payer: Self-pay | Admitting: Family Medicine

## 2014-11-16 DIAGNOSIS — I10 Essential (primary) hypertension: Secondary | ICD-10-CM | POA: Diagnosis not present

## 2014-11-16 DIAGNOSIS — G459 Transient cerebral ischemic attack, unspecified: Secondary | ICD-10-CM | POA: Diagnosis not present

## 2014-11-16 DIAGNOSIS — I6529 Occlusion and stenosis of unspecified carotid artery: Secondary | ICD-10-CM | POA: Diagnosis not present

## 2014-11-16 DIAGNOSIS — I6523 Occlusion and stenosis of bilateral carotid arteries: Secondary | ICD-10-CM | POA: Diagnosis not present

## 2014-11-16 DIAGNOSIS — G549 Nerve root and plexus disorder, unspecified: Secondary | ICD-10-CM | POA: Diagnosis not present

## 2014-11-16 DIAGNOSIS — D649 Anemia, unspecified: Secondary | ICD-10-CM | POA: Diagnosis not present

## 2014-11-19 DIAGNOSIS — Z9181 History of falling: Secondary | ICD-10-CM | POA: Diagnosis not present

## 2014-11-19 DIAGNOSIS — I10 Essential (primary) hypertension: Secondary | ICD-10-CM | POA: Diagnosis not present

## 2014-11-19 DIAGNOSIS — Z8673 Personal history of transient ischemic attack (TIA), and cerebral infarction without residual deficits: Secondary | ICD-10-CM | POA: Diagnosis not present

## 2014-11-19 DIAGNOSIS — H409 Unspecified glaucoma: Secondary | ICD-10-CM | POA: Diagnosis not present

## 2014-11-19 DIAGNOSIS — F0390 Unspecified dementia without behavioral disturbance: Secondary | ICD-10-CM | POA: Diagnosis not present

## 2014-11-26 DIAGNOSIS — H409 Unspecified glaucoma: Secondary | ICD-10-CM | POA: Diagnosis not present

## 2014-11-26 DIAGNOSIS — I10 Essential (primary) hypertension: Secondary | ICD-10-CM | POA: Diagnosis not present

## 2014-11-26 DIAGNOSIS — Z8673 Personal history of transient ischemic attack (TIA), and cerebral infarction without residual deficits: Secondary | ICD-10-CM | POA: Diagnosis not present

## 2014-11-26 DIAGNOSIS — Z9181 History of falling: Secondary | ICD-10-CM | POA: Diagnosis not present

## 2014-11-26 DIAGNOSIS — F0390 Unspecified dementia without behavioral disturbance: Secondary | ICD-10-CM | POA: Diagnosis not present

## 2014-12-03 DIAGNOSIS — Z9181 History of falling: Secondary | ICD-10-CM | POA: Diagnosis not present

## 2014-12-03 DIAGNOSIS — F0390 Unspecified dementia without behavioral disturbance: Secondary | ICD-10-CM | POA: Diagnosis not present

## 2014-12-03 DIAGNOSIS — Z8673 Personal history of transient ischemic attack (TIA), and cerebral infarction without residual deficits: Secondary | ICD-10-CM | POA: Diagnosis not present

## 2014-12-03 DIAGNOSIS — I10 Essential (primary) hypertension: Secondary | ICD-10-CM | POA: Diagnosis not present

## 2014-12-03 DIAGNOSIS — H409 Unspecified glaucoma: Secondary | ICD-10-CM | POA: Diagnosis not present

## 2014-12-04 DIAGNOSIS — G459 Transient cerebral ischemic attack, unspecified: Secondary | ICD-10-CM | POA: Diagnosis not present

## 2014-12-04 DIAGNOSIS — I10 Essential (primary) hypertension: Secondary | ICD-10-CM | POA: Diagnosis not present

## 2014-12-04 DIAGNOSIS — M179 Osteoarthritis of knee, unspecified: Secondary | ICD-10-CM | POA: Diagnosis not present

## 2014-12-04 DIAGNOSIS — F0390 Unspecified dementia without behavioral disturbance: Secondary | ICD-10-CM | POA: Diagnosis not present

## 2014-12-04 DIAGNOSIS — D649 Anemia, unspecified: Secondary | ICD-10-CM | POA: Diagnosis not present

## 2014-12-10 DIAGNOSIS — F0390 Unspecified dementia without behavioral disturbance: Secondary | ICD-10-CM | POA: Diagnosis not present

## 2014-12-10 DIAGNOSIS — Z8673 Personal history of transient ischemic attack (TIA), and cerebral infarction without residual deficits: Secondary | ICD-10-CM | POA: Diagnosis not present

## 2014-12-10 DIAGNOSIS — Z9181 History of falling: Secondary | ICD-10-CM | POA: Diagnosis not present

## 2014-12-10 DIAGNOSIS — I10 Essential (primary) hypertension: Secondary | ICD-10-CM | POA: Diagnosis not present

## 2014-12-10 DIAGNOSIS — H409 Unspecified glaucoma: Secondary | ICD-10-CM | POA: Diagnosis not present

## 2014-12-13 ENCOUNTER — Ambulatory Visit
Admit: 2014-12-13 | Disposition: A | Discharge status: Home / Self Care | Payer: Self-pay | Attending: Pain Medicine | Admitting: Pain Medicine

## 2014-12-13 DIAGNOSIS — M542 Cervicalgia: Secondary | ICD-10-CM | POA: Diagnosis not present

## 2014-12-13 DIAGNOSIS — M170 Bilateral primary osteoarthritis of knee: Secondary | ICD-10-CM | POA: Diagnosis not present

## 2014-12-13 DIAGNOSIS — R51 Headache: Secondary | ICD-10-CM | POA: Diagnosis not present

## 2014-12-13 DIAGNOSIS — B0223 Postherpetic polyneuropathy: Secondary | ICD-10-CM | POA: Diagnosis not present

## 2014-12-13 DIAGNOSIS — M5481 Occipital neuralgia: Secondary | ICD-10-CM | POA: Diagnosis not present

## 2014-12-20 DIAGNOSIS — Z8673 Personal history of transient ischemic attack (TIA), and cerebral infarction without residual deficits: Secondary | ICD-10-CM | POA: Diagnosis not present

## 2014-12-20 DIAGNOSIS — F0390 Unspecified dementia without behavioral disturbance: Secondary | ICD-10-CM | POA: Diagnosis not present

## 2014-12-20 DIAGNOSIS — I10 Essential (primary) hypertension: Secondary | ICD-10-CM | POA: Diagnosis not present

## 2014-12-20 DIAGNOSIS — H409 Unspecified glaucoma: Secondary | ICD-10-CM | POA: Diagnosis not present

## 2014-12-20 DIAGNOSIS — Z9181 History of falling: Secondary | ICD-10-CM | POA: Diagnosis not present

## 2015-01-13 ENCOUNTER — Encounter: Payer: Self-pay | Admitting: Pain Medicine

## 2015-01-13 DIAGNOSIS — M5136 Other intervertebral disc degeneration, lumbar region: Secondary | ICD-10-CM

## 2015-01-13 DIAGNOSIS — M170 Bilateral primary osteoarthritis of knee: Secondary | ICD-10-CM

## 2015-01-13 DIAGNOSIS — M5481 Occipital neuralgia: Secondary | ICD-10-CM

## 2015-01-13 DIAGNOSIS — M1710 Unilateral primary osteoarthritis, unspecified knee: Secondary | ICD-10-CM | POA: Insufficient documentation

## 2015-01-13 DIAGNOSIS — M51369 Other intervertebral disc degeneration, lumbar region without mention of lumbar back pain or lower extremity pain: Secondary | ICD-10-CM

## 2015-01-13 DIAGNOSIS — B0229 Other postherpetic nervous system involvement: Secondary | ICD-10-CM

## 2015-01-13 DIAGNOSIS — M179 Osteoarthritis of knee, unspecified: Secondary | ICD-10-CM | POA: Insufficient documentation

## 2015-01-14 ENCOUNTER — Encounter: Payer: Self-pay | Admitting: Pain Medicine

## 2015-01-28 ENCOUNTER — Other Ambulatory Visit: Payer: Self-pay | Admitting: Pain Medicine

## 2015-01-30 ENCOUNTER — Encounter: Payer: Self-pay | Admitting: Pain Medicine

## 2015-01-30 ENCOUNTER — Ambulatory Visit: Payer: Medicare Other | Attending: Pain Medicine | Admitting: Pain Medicine

## 2015-01-30 VITALS — BP 122/48 | HR 57 | Temp 98.3°F | Resp 16 | Ht 63.0 in | Wt 152.0 lb

## 2015-01-30 DIAGNOSIS — M5481 Occipital neuralgia: Secondary | ICD-10-CM | POA: Diagnosis not present

## 2015-01-30 DIAGNOSIS — M179 Osteoarthritis of knee, unspecified: Secondary | ICD-10-CM | POA: Insufficient documentation

## 2015-01-30 DIAGNOSIS — B0223 Postherpetic polyneuropathy: Secondary | ICD-10-CM | POA: Diagnosis not present

## 2015-01-30 DIAGNOSIS — M170 Bilateral primary osteoarthritis of knee: Secondary | ICD-10-CM

## 2015-01-30 DIAGNOSIS — B0229 Other postherpetic nervous system involvement: Secondary | ICD-10-CM | POA: Diagnosis not present

## 2015-01-30 DIAGNOSIS — M5136 Other intervertebral disc degeneration, lumbar region: Secondary | ICD-10-CM

## 2015-01-30 DIAGNOSIS — R51 Headache: Secondary | ICD-10-CM | POA: Diagnosis present

## 2015-01-30 MED ORDER — OXYCODONE HCL 5 MG PO CAPS: ORAL_CAPSULE | ORAL | Status: DC

## 2015-01-30 MED ORDER — LIDOCAINE 5 % EX PTCH: MEDICATED_PATCH | CUTANEOUS | Status: DC

## 2015-01-30 NOTE — Progress Notes (Signed)
   Subjective:    Patient ID: Bary Richard, female    DOB: Nov 28, 1929, 81 y.o.   MRN: [id number]  HPI  Patient is a 81 year old female returns to [place] for further evaluation and treatment of pain involving the left head. Patient is with prior shingles of the left side of the head with burning stinging throbbing pain and increased systems touch of the area above the eye on the left involving the forehead and the left side of the scalp. Patient has had improvement with prior treatment and [facility] and states that he is in hopes of being able to undergo treatment in attempt to decrease the severity of the burning stinging throbbing pain of the left side of the face we discussed patient's condition and will consider patient for treatment at time return appointment as discussed in attempt to decrease severity of symptoms minimize progression of patient's symptoms and hopefully avoid the need for more involved treatment the patient was understanding and agreed with suggested treatment plan.    Review of Systems     Objective:   Physical Exam  Palpation of the splenius capitis and occipitalis musculature regions reproduced moderate to moderately severe pain on the left side with severe tends to palpation of the supraorbital region on the left as well with out evidence of newly formed lesions of the skin noted. There was tends to palpation of the cervical paraspinal musculature region cervical facet region a moderate degree as well. There was unremarkable Spurling's maneuver and there was minimal tenderness to palpation of the acromioclavicular and glenohumeral joint regions. Palpation over the thoracic facet thoracic paraspinal musculature region was a tends to palpation of minimal degree Tinel's and Phalen's maneuver were without increased pain of any significant degree and patient appeared to be with slightly decreased grip strength. Tenderness to palpation over the  lumbar paraspinal musculature region lumbar facet region was mild with mild tenderness over the gluteal and piriformis musculature region as well palpation of the left knee was a tends to palpation with crepitus of the left knee as well as tends to palpation of the right knee and crepitus of the right knee without increased warmth and erythema in the regions of the knees. Straight leg raising tolerates approximately 20 without increase of pain with dorsiflexion noted. No sensory deficit of dermatomal distribution detected. Negative clonus negative Homans. Abdomen nontender. No costovertebral maintenance note        Assessment & Plan:    Postherpetic neuralgia left side of face and head  Greater occipital neuralgia  Degenerative joint disease of knee     Plan   Continue present medications.  Will  perform greater occipital nerve block at time of return appointment  F/U PCP for evaliation of  BP and general medical  condition.  F/U surgical evaluation.  F/U neurological evaluation.  May consider radiofrequency rhizolysis or intraspinal procedures pending response to present treatment and F/U evaluation.  Patient to call [facility] should patient have concerns prior to scheduled return appointment.

## 2015-01-30 NOTE — Progress Notes (Signed)
Safety precautions to be maintained throughout the outpatient stay will include: orient to surroundings, keep bed in low position, maintain call bell within reach at all times, provide assistance with transfer out of bed and ambulation.  Discharged at 1100, ambulatory.

## 2015-01-30 NOTE — Patient Instructions (Addendum)
Continue present medications.  We will perform greater occipital nerve block on Monday, 02/04/2015  F/U PCP for evaliation of  BP and general medical  condition.. Please see Dr. Whitman Hero for blood pressure and low pulse as discussed  F/U surgical evaluation.  F/U neurological evaluation.  May consider radiofrequency rhizolysis or intraspinal procedures pending response to present treatment and F/U evaluation.  Patient to call [facility] should patient have concerns prior to scheduled return appointment. Occipital Nerve Block Patient Information  Description: The occipital nerves originate in the cervical (neck) spinal cord and travel upward through muscle and tissue to supply sensation to the back of the head and top of the scalp.  In addition, the nerves control some of the muscles of the scalp.  Occipital neuralgia is an irritation of these nerves which can cause headaches, numbness of the scalp, and neck discomfort.     The occipital nerve block will interrupt nerve transmission through these nerves and can relieve pain and spasm.  The block consists of insertion of a small needle under the skin in the back of the head to deposit local anesthetic (numbing medicine) and/or steroids around the nerve.  The entire block usually lasts less than 5 minutes.  Conditions which may be treated by occipital blocks:   Muscular pain and spasm of the scalp  Nerve irritation, back of the head  Headaches  Upper neck pain  Preparation for the injection:  1. Do not eat any solid food or dairy products within 6 hours of your appointment. 2. You may drink clear liquids up to 2 hours before appointment.  Clear liquids include water, black coffee, juice or soda.  No milk or cream please. 3. You may take your regular medication, including pain medications, with a sip of water before you appointment.  Diabetics should hold regular insulin (if taken separately) and take 1/2 normal NPH dose the  morning of the procedure.  Carry some sugar containing items with you to your appointment. 4. A driver must accompany you and be prepared to drive you home after your procedure. 5. Bring all your current medications with you. 6. An IV may be inserted and sedation may be given at the discretion of the physician. 7. A blood pressure cuff, EKG, and other monitors will often be applied during the procedure.  Some patients may need to have extra oxygen administered for a short period. 8. You will be asked to provide medical information, including your allergies and medications, prior to the procedure.  We must know immediately if you are taking blood thinners (like Coumadin/Warfarin) or if you are allergic to IV iodine contrast (dye).  We must know if you could possible be pregnant.  9. Do not wear a high collared shirt or turtleneck.  Tie long hair up in the back if possible.  Possible side-effects:   Bleeding from needle site  Infection (rare, may require surgery)  Nerve injury (rare)  Hair on back of neck can be tinged with iodine scrub (this will wash out)  Light-headedness (temporary)  Pain at injection site (several days)  Decreased blood pressure (rare, temporary)  Seizure (very rare)  Call if you experience:   Hives or difficulty breathing ( go to the emergency room)  Inflammation or drainage at the injection site(s)  Please note:  Although the local anesthetic injected can often make your painful muscles or headache feel good for several hours after the injection, the pain may return.  It takes 3-7 days for  steroids to work.  You may not notice any pain relief for at least one week.  If effective, we will often do a series of injections spaced 3-6 weeks apart to maximally decrease your pain.  If you have any questions, please call [phone] [facility]

## 2015-02-01 ENCOUNTER — Other Ambulatory Visit: Payer: Self-pay | Admitting: Pain Medicine

## 2015-02-01 DIAGNOSIS — M170 Bilateral primary osteoarthritis of knee: Secondary | ICD-10-CM

## 2015-02-01 DIAGNOSIS — M5136 Other intervertebral disc degeneration, lumbar region: Secondary | ICD-10-CM

## 2015-02-01 DIAGNOSIS — M5481 Occipital neuralgia: Secondary | ICD-10-CM

## 2015-02-01 DIAGNOSIS — B0229 Other postherpetic nervous system involvement: Secondary | ICD-10-CM

## 2015-02-04 ENCOUNTER — Ambulatory Visit: Payer: Medicare Other | Attending: Pain Medicine | Admitting: Pain Medicine

## 2015-02-04 ENCOUNTER — Encounter: Payer: Self-pay | Admitting: Pain Medicine

## 2015-02-04 VITALS — BP 160/49 | HR 61 | Temp 98.7°F | Resp 16 | Ht 65.0 in | Wt 174.0 lb

## 2015-02-04 DIAGNOSIS — R51 Headache: Secondary | ICD-10-CM | POA: Diagnosis not present

## 2015-02-04 DIAGNOSIS — M5481 Occipital neuralgia: Secondary | ICD-10-CM

## 2015-02-04 DIAGNOSIS — M542 Cervicalgia: Secondary | ICD-10-CM | POA: Diagnosis not present

## 2015-02-04 DIAGNOSIS — M170 Bilateral primary osteoarthritis of knee: Secondary | ICD-10-CM

## 2015-02-04 DIAGNOSIS — M5136 Other intervertebral disc degeneration, lumbar region: Secondary | ICD-10-CM

## 2015-02-04 DIAGNOSIS — B0223 Postherpetic polyneuropathy: Secondary | ICD-10-CM | POA: Diagnosis not present

## 2015-02-04 DIAGNOSIS — B0229 Other postherpetic nervous system involvement: Secondary | ICD-10-CM

## 2015-02-04 MED ORDER — BUPIVACAINE HCL (PF) 0.25 % IJ SOLN
INTRAMUSCULAR | Status: AC
  Administered 2015-02-04: 15 mL
  Filled 2015-02-04: qty 30

## 2015-02-04 MED ORDER — TRIAMCINOLONE ACETONIDE 40 MG/ML IJ SUSP
INTRAMUSCULAR | Status: AC
  Administered 2015-02-04: 10 mg
  Filled 2015-02-04: qty 1

## 2015-02-04 MED ORDER — MIDAZOLAM HCL 5 MG/5ML IJ SOLN
INTRAMUSCULAR | Status: AC
  Administered 2015-02-04: 1 mg via INTRAVENOUS
  Filled 2015-02-04: qty 5

## 2015-02-04 MED ORDER — ORPHENADRINE CITRATE 30 MG/ML IJ SOLN
INTRAMUSCULAR | Status: AC
  Filled 2015-02-04: qty 2

## 2015-02-04 MED ORDER — FENTANYL CITRATE (PF) 100 MCG/2ML IJ SOLN
INTRAMUSCULAR | Status: AC
  Administered 2015-02-04: 50 ug via INTRAVENOUS
  Filled 2015-02-04: qty 2

## 2015-02-04 NOTE — Patient Instructions (Addendum)
Continue present medications.  F/U PCP for evaliation of  BP and general medical  condition.  F/U surgical evaluation.  F/U neurological evaluation.  May consider radiofrequency rhizolysis or intraspinal procedures pending response to present treatment and F/U evaluation.  Patient to call [facility] should patient have concerns prior to scheduled return appointment.  Safety precautions to be maintained throughout the outpatient stay will include: orient to surroundings, keep bed in low position, maintain call bell within reach at all times, provide assistance with transfer out of bed and ambulation.   Pain Management Discharge Instructions  General Discharge Instructions :  If you need to reach your doctor call: Monday-Friday 8:00 am - 4:00 pm at [phone] or toll free [phone].  After clinic hours [phone] to have operator reach doctor.  Bring all of your medication bottles to all your appointments in the pain clinic.  To cancel or reschedule your appointment with Pain Management please remember to call 24 hours in advance to avoid a fee.  Refer to the educational materials which you have been given on: General Risks, I had my Procedure. Discharge Instructions, Post Sedation.  Post Procedure Instructions:  The drugs you were given will stay in your system until tomorrow, so for the next 24 hours you should not drive, make any legal decisions or drink any alcoholic beverages.  You may eat anything you prefer, but it is better to start with liquids then soups and crackers, and gradually work up to solid foods.  Please notify your doctor immediately if you have any unusual bleeding, trouble breathing or pain that is not related to your normal pain.  Depending on the type of procedure that was done, some parts of your body may feel week and/or numb.  This usually clears up by tonight or the next day.  Walk with the use of an assistive device or accompanied by  an adult for the 24 hours.  You may use ice on the affected area for the first 24 hours.  Put ice in a Ziploc bag and cover with a towel and place against area 15 minutes on 15 minutes off.  You may switch to heat after 24 hours.

## 2015-02-04 NOTE — Progress Notes (Signed)
   Subjective:    Patient ID: Melissa Combs, female    DOB: 1930/05/13, 81 y.o.   MRN: [id number]  HPI    Review of Systems     Objective:   Physical Exam        Assessment & Plan:

## 2015-02-04 NOTE — Progress Notes (Signed)
Safety precautions to be maintained throughout the outpatient stay will include: orient to surroundings, keep bed in low position, maintain call bell within reach at all times, provide assistance with transfer out of bed and ambulation.  

## 2015-02-04 NOTE — Progress Notes (Signed)
   Subjective:    Patient ID: Melissa Combs, female    DOB: [birth year], 81 y.o.   MRN: [id number]  HPI  NOTE: The patient is a 81 y.o.-year-old female who returns to the [facility] for further evaluation and treatment of pain consisting of pain involving the region of the neck and headache.  Patient is with prior history revealing patient to be with history of shingles occurring left side of head and face with persistent pain felt to be due to postherpetic neuralgia .  The risks, benefits, and expectations of the procedure have been discussed and explained to patient, who is understanding and wishes to proceed with interventional treatment as discussed and as explained to patient.  Will proceed with greater occipital nerve blocks with myoneural block injections at this time as discussed and as explained to patient.  All are understanding and in agreement with suggested treatment plan.    PROCEDURE:  Greater occipital nerve block on the left side with IV Versed, IV Fentanyl, conscious sedation, EKG, blood pressure, pulse, pulse oximetry monitoring.  Procedure performed with patient in prone position.  Greater occipital nerve block on the left side.   With patient in prone position, Betadine prep of proposed entry site accomplished.  Following identification of the nuchal ridge, 22 -gauge needle was inserted at the level of the nuchal ridge medial to the occipital artery.  Following negative aspiration, 4cc 0.25% bupivacaine with Kenalog injected for left greater occipital nerve block.  Needle was removed.  Patient tolerated injection well.    Greater occipital nerve block on the right side. The procedure was performed on the right side exactly as was performed on the left side.  Subcutaneous infiltration of the ropivacaine with Norflex was injected subcutaneously in the region of the left forehead continuing to the hairline   A total of 10 mg Kenalog was utilized for the entire  procedure.  PLAN:    1. Medications: Will continue presently prescribed medications at this time. 2. Patient to follow up with primary care physician for evaluation of blood pressure and general medical condition status post procedure performed on today's visit. 3. Neurological evaluation for further assessment of headaches for further studies as discussed. 4. Patient may be candidate for Botox injections, radiofrequency procedures, as well as implantation type procedures pending response to treatment rendered on today's visit and pending follow-up evaluation. 5. Patient has been advised to adhere to proper body mechanics and to avoid activities which appear to aggravate condition.cations:  Will continue presently prescribed medications at this time. 6. The patient is understanding and in agreement with the suggested treatment plan.   Review of Systems     Objective:   Physical Exam        Assessment & Plan:

## 2015-02-05 ENCOUNTER — Telehealth: Payer: Self-pay | Admitting: *Deleted

## 2015-02-05 NOTE — Telephone Encounter (Signed)
Spoke with staff at patient's place of residence, reports "she's doing all right."

## 2015-02-06 ENCOUNTER — Other Ambulatory Visit: Payer: Self-pay | Admitting: Pain Medicine

## 2015-02-15 ENCOUNTER — Other Ambulatory Visit: Payer: Self-pay | Admitting: Family Medicine

## 2015-02-19 ENCOUNTER — Telehealth: Payer: Self-pay

## 2015-02-19 NOTE — Telephone Encounter (Signed)
[facility] called, they need a new order for her Voltaren gel to say TID and not TID PRN. They are using it every day. Fax corrected order to [phone]

## 2015-02-19 NOTE — Telephone Encounter (Signed)
Directions change faxed to [facility].

## 2015-02-19 NOTE — Telephone Encounter (Signed)
That's fine

## 2015-02-20 ENCOUNTER — Telehealth: Payer: Self-pay

## 2015-02-20 NOTE — Telephone Encounter (Signed)
They need to know if she is to be taking Aspirin 81mg  or 325mg . We have 81mg  on file, but I wanted to double check with you.

## 2015-02-20 NOTE — Telephone Encounter (Signed)
Please check [facility]; if I recall, she saw a neurologist not long ago, was maybe at [facility] (?) I think the neurologist or hospitalist made a recommendation about the aspirin dose If any doubt [facility] what the neurologist said, let me now what you looked at for reference and then back to me

## 2015-02-22 NOTE — Telephone Encounter (Signed)
She saw Dr. Melrose Nakayama at [facility] in March and per their records in [place], she is to continue taking Aspirin 81mg .

## 2015-02-22 NOTE — Telephone Encounter (Signed)
Great. Please communicate that with them. Thank you, Dr. Sanda Klein

## 2015-02-25 NOTE — Telephone Encounter (Signed)
[facility] notified.

## 2015-02-28 ENCOUNTER — Encounter: Payer: Medicare Other | Admitting: Pain Medicine

## 2015-03-05 ENCOUNTER — Telehealth: Payer: Self-pay | Admitting: Pain Medicine

## 2015-03-05 DIAGNOSIS — D649 Anemia, unspecified: Secondary | ICD-10-CM | POA: Insufficient documentation

## 2015-03-05 DIAGNOSIS — K5909 Other constipation: Secondary | ICD-10-CM | POA: Insufficient documentation

## 2015-03-05 DIAGNOSIS — M1990 Unspecified osteoarthritis, unspecified site: Secondary | ICD-10-CM | POA: Insufficient documentation

## 2015-03-05 DIAGNOSIS — F0390 Unspecified dementia without behavioral disturbance: Secondary | ICD-10-CM | POA: Insufficient documentation

## 2015-03-05 DIAGNOSIS — T7840XA Allergy, unspecified, initial encounter: Secondary | ICD-10-CM | POA: Insufficient documentation

## 2015-03-05 DIAGNOSIS — H409 Unspecified glaucoma: Secondary | ICD-10-CM | POA: Insufficient documentation

## 2015-03-05 DIAGNOSIS — B0229 Other postherpetic nervous system involvement: Secondary | ICD-10-CM | POA: Insufficient documentation

## 2015-03-05 DIAGNOSIS — I10 Essential (primary) hypertension: Secondary | ICD-10-CM | POA: Insufficient documentation

## 2015-03-05 NOTE — Telephone Encounter (Signed)
Nurses  Patient will need appointment to get prescription Patient missed her last appointment Please schedule patient for appointment to get refill

## 2015-03-05 NOTE — Telephone Encounter (Signed)
Spoke with Otila Kluver at [facility]. Informed her that we cannot prescribe meds outside of appointment. Otila Kluver requesting an order to discontinue medication until next visit. Order [phone]

## 2015-03-05 NOTE — Telephone Encounter (Signed)
Dr. Primus Bravo, Please advise.

## 2015-03-05 NOTE — Telephone Encounter (Signed)
Melissa Combs from [facility] would like to get a script for Melissa Combs to hold until her apt . She has an apt next week.

## 2015-03-11 ENCOUNTER — Ambulatory Visit: Payer: Medicare Other | Admitting: Family Medicine

## 2015-03-12 ENCOUNTER — Other Ambulatory Visit: Payer: Self-pay | Admitting: Family Medicine

## 2015-03-12 NOTE — Telephone Encounter (Signed)
Routing to provider  

## 2015-03-13 ENCOUNTER — Ambulatory Visit: Payer: Medicare Other | Attending: Pain Medicine | Admitting: Pain Medicine

## 2015-03-13 ENCOUNTER — Encounter: Payer: Self-pay | Admitting: Pain Medicine

## 2015-03-13 VITALS — BP 140/50 | HR 67 | Temp 98.4°F | Resp 99

## 2015-03-13 DIAGNOSIS — R51 Headache: Secondary | ICD-10-CM | POA: Diagnosis present

## 2015-03-13 DIAGNOSIS — M179 Osteoarthritis of knee, unspecified: Secondary | ICD-10-CM | POA: Insufficient documentation

## 2015-03-13 DIAGNOSIS — M5136 Other intervertebral disc degeneration, lumbar region: Secondary | ICD-10-CM

## 2015-03-13 DIAGNOSIS — B0229 Other postherpetic nervous system involvement: Secondary | ICD-10-CM | POA: Diagnosis not present

## 2015-03-13 DIAGNOSIS — B0223 Postherpetic polyneuropathy: Secondary | ICD-10-CM | POA: Diagnosis not present

## 2015-03-13 DIAGNOSIS — M791 Myalgia: Secondary | ICD-10-CM | POA: Diagnosis not present

## 2015-03-13 DIAGNOSIS — M170 Bilateral primary osteoarthritis of knee: Secondary | ICD-10-CM

## 2015-03-13 DIAGNOSIS — M5416 Radiculopathy, lumbar region: Secondary | ICD-10-CM | POA: Diagnosis not present

## 2015-03-13 DIAGNOSIS — M47817 Spondylosis without myelopathy or radiculopathy, lumbosacral region: Secondary | ICD-10-CM | POA: Diagnosis not present

## 2015-03-13 DIAGNOSIS — M5481 Occipital neuralgia: Secondary | ICD-10-CM

## 2015-03-13 MED ORDER — OXYCODONE HCL 5 MG PO CAPS: ORAL_CAPSULE | ORAL | Status: DC

## 2015-03-13 NOTE — Patient Instructions (Addendum)
Continue present medication oxycodone  F/U PCP Dr. Sanda Klein for evaliation of  BP and general medical  condition.  F/U surgical evaluation  F/U neurological evaluation  May consider radiofrequency rhizolysis or intraspinal procedures pending response to present treatment and F/U evaluation.  Patient to call [facility] should patient have concerns prior to scheduled return appointment.   A prescription for OXYCODONE was given to you today.

## 2015-03-13 NOTE — Progress Notes (Signed)
Safety precautions to be maintained throughout the outpatient stay will include: orient to surroundings, keep bed in low position, maintain call bell within reach at all times, provide assistance with transfer out of bed and ambulation.  

## 2015-03-13 NOTE — Progress Notes (Signed)
   Subjective:    Patient ID: Melissa Combs, female    DOB: [birth year], 81 y.o.   MRN: [id number]  HPI  Patient is a 57-year-old female returns to [place] for further evaluation and treatment of pain involving the region of the head on the left with history of shingles with pain felt to be due to postherpetic neuralgia. Patient is accompanied by caregiver on today's visit. I'll tingling degree the patient's pain appears to be well-controlled at this time with the present treatment regimen. Patient also with pain involving the region of the right knee which appears to be fairly well-controlled as well. We will continue oxycodone and avoid interventional treatment as discussed and explained to patient on today's visit. All understanding and in agreement with suggested treatment plan.    Review of Systems     Objective:   Physical Exam  There was tends to palpation of the paraspinal musculature region cervical region cervical facet region palpation which reproduced mild discomfort. There was mild tends to palpation of the head on the left without evidence of newly formed lesions of the skin noted. There was mild tenderness of the splenius capitis and occipitalis musculature region. Palpation over the cervical facet cervical paraspinal musculature region as well as the thoracic facet thoracic paraspinal musculature region reproduced mild discomfort. Patient appeared to be with bilaterally equal grip strength. Tinel and Phalen's maneuver without increase of pain of significant degree. Palpation of the gluteal and piriformis musketry region reproduced mild discomfort. There was mild tenderness of the greater trochanteric region iliotibial band region. Straight leg raising tolerated to 30 without an increase of pain with dorsiflexion noted. Negative clonus negative Homans. No sensory deficit of dermatomal distribution detected. Abdomen nontender with no costovertebral angle tenderness  noted.      Assessment & Plan:    Postherpetic neuralgia of left forehead and face Pain well-controlled  Degenerative disc disease lumbar spine Lumbar facet syndrome  Degenerative joint disease of knee    Plan   Continue present medication oxycodone  F/U PCP Dr.Lada   for evaliation of  BP and general medical  condition.  F/U surgical evaluation of  knee as discussed  F/U neurological evaluationAs discussed and as needed  May consider radiofrequency rhizolysis or intraspinal procedures pending response to present treatment and F/U evaluation.  Patient to call [facility] Center should patient have concerns prior to scheduled return appointment.

## 2015-03-13 NOTE — Progress Notes (Signed)
   Subjective:    Patient ID: Melissa Combs, female    DOB: 08-Jun-1930, 81 y.o.   MRN: [id number]  HPI    Review of Systems     Objective:   Physical Exam        Assessment & Plan:

## 2015-03-19 ENCOUNTER — Ambulatory Visit: Payer: Medicare Other | Admitting: Family Medicine

## 2015-03-25 ENCOUNTER — Other Ambulatory Visit: Payer: Self-pay | Admitting: Family Medicine

## 2015-03-28 ENCOUNTER — Ambulatory Visit (INDEPENDENT_AMBULATORY_CARE_PROVIDER_SITE_OTHER): Payer: Medicare Other | Admitting: Family Medicine

## 2015-03-28 ENCOUNTER — Encounter: Payer: Self-pay | Admitting: Family Medicine

## 2015-03-28 VITALS — BP 149/64 | HR 59 | Temp 98.0°F | Wt 151.0 lb

## 2015-03-28 DIAGNOSIS — F0390 Unspecified dementia without behavioral disturbance: Secondary | ICD-10-CM | POA: Diagnosis not present

## 2015-03-28 DIAGNOSIS — D649 Anemia, unspecified: Secondary | ICD-10-CM

## 2015-03-28 DIAGNOSIS — K5900 Constipation, unspecified: Secondary | ICD-10-CM | POA: Diagnosis not present

## 2015-03-28 DIAGNOSIS — I10 Essential (primary) hypertension: Secondary | ICD-10-CM | POA: Diagnosis not present

## 2015-03-28 DIAGNOSIS — N183 Chronic kidney disease, stage 3 unspecified: Secondary | ICD-10-CM

## 2015-03-28 DIAGNOSIS — B0229 Other postherpetic nervous system involvement: Secondary | ICD-10-CM | POA: Diagnosis not present

## 2015-03-28 DIAGNOSIS — K5909 Other constipation: Secondary | ICD-10-CM

## 2015-03-28 NOTE — Progress Notes (Signed)
BP 149/64 mmHg  Pulse 59  Temp(Src) 98 F (36.7 C)  Wt 151 lb (68.493 kg)  SpO2 100%   Subjective:    Patient ID: Melissa Combs, female    DOB: [birth year], 81 y.o.   MRN: [id number]  HPI: Melissa Combs is a 81 y.o. female  Chief Complaint  Patient presents with  . Hypertension  . Anemia  . Dementia  . OTHER    Discuss her possibly needing a higher level of care  Here with caregiver  Hypertension; recheck was 149/64 with heart rate 59; on medicines; last creatinine reviewed, declined in GFR over last 1-1/2 years; no NSAIDs  Anemia; no blood in the urine or stool  Dementia; caregiver and daughter notice decline; may need to adjust higher level of care; some behavioral disturbances and agitation; won't eat some times  Relevant past medical, surgical, family and social history reviewed and updated as indicated. Interim medical history since our last visit reviewed. Allergies and medications reviewed and updated.  Review of Systems  Per HPI unless specifically indicated above     Objective:    BP 149/64 mmHg  Pulse 59  Temp(Src) 98 F (36.7 C)  Wt 151 lb (68.493 kg)  SpO2 100%  Wt Readings from Last 3 Encounters:  03/28/15 151 lb (68.493 kg)  12/04/14 155 lb (70.308 kg)  02/04/15 174 lb (78.926 kg)    Physical Exam  Constitutional: She appears well-developed and well-nourished. No distress.  HENT:  Head: Normocephalic and atraumatic.  Eyes: EOM are normal. No scleral icterus.  Cardiovascular: Normal rate, regular rhythm and normal heart sounds.   No murmur heard. Pulmonary/Chest: Effort normal and breath sounds normal. No respiratory distress. She has no wheezes.  Abdominal: Soft. Bowel sounds are normal. She exhibits no distension.  Musculoskeletal: Normal range of motion. She exhibits no edema.  Neurological: She is alert. She displays no tremor. She exhibits normal muscle tone.  Skin: Skin is warm and dry. She is not diaphoretic. No pallor.   Psychiatric: Her mood appears not anxious. Her affect is not blunt and not inappropriate. Her speech is not rapid and/or pressured, not delayed and not slurred. She is not withdrawn. Thought content is not delusional. Cognition and memory are impaired. She does not express impulsivity or inappropriate judgment. She exhibits abnormal recent memory.    Results for orders placed or performed in visit on 02/01/13  CBC  Result Value Ref Range   WBC 9.8 3.6-11.0 x10 3/mm 3   RBC 4.03 3.80-5.20 X10 6/mm 3   HGB 11.8 (L) 12.0-16.0 g/dL   HCT 35.2 35.0-47.0 %   MCV 87 80-100 fL   MCH 29.2 26.0-34.0 pg   MCHC 33.5 32.0-36.0 g/dL   RDW 13.3 11.5-14.5 %   Platelet 291 150-440 x10 3/mm 3  Comprehensive metabolic panel  Result Value Ref Range   Glucose 96 65-99 mg/dL   BUN 23 (H) 7-18 mg/dL   Creatinine 1.10 0.60-1.30 mg/dL   Sodium 137 136-145 mmol/L   Potassium 3.4 (L) 3.5-5.1 mmol/L   Chloride 99 98-107 mmol/L   Co2 34 (H) 21-32 mmol/L   Calcium, Total 9.6 8.5-10.1 mg/dL   SGOT(AST) 22 15-37 Unit/L   SGPT (ALT) 10 (L) 12-78 U/L   Alkaline Phosphatase 55 50-136 Unit/L   Albumin 4.1 3.4-5.0 g/dL   Total Protein 8.6 (H) 6.4-8.2 g/dL   Bilirubin,Total 0.3 0.2-1.0 mg/dL   Osmolality 277 275-301   Anion Gap 4 (L) 7-16   EGFR (African American) 55 (L)  EGFR (Non-African Amer.) 47 (L)   Troponin I  Result Value Ref Range   Troponin-I < 0.02 ng/mL  Urinalysis, Complete  Result Value Ref Range   Color - urine Colorless    Clarity - urine Clear    Glucose,UR Negative 0-75 mg/dL   Bilirubin,UR Negative NEGATIVE   Ketone Negative NEGATIVE   Specific Gravity 1.003 1.003-1.030   Blood 1+ NEGATIVE   Ph 8.0 4.5-8.0   Protein Negative NEGATIVE   Nitrite Negative NEGATIVE   Leukocyte Esterase Trace NEGATIVE   RBC,UR NONE SEEN 0-5 /HPF   WBC UR 5 /HPF 0-5 /HPF   Bacteria TRACE NONE SEEN   Squamous Epithelial <1 /HPF       Assessment & Plan:   Problem List Items Addressed This Visit       Cardiovascular and Mediastinum   Hypertension    Improved on recheck; no changes to medicine        Digestive   Chronic constipation    Likely related to long-term narcotic use; on medicine regimen for stools; check TSH      Relevant Orders   TSH     Nervous and Auditory   Dementia    Caregiver noticed decline; continue Namenda; may need higher level of care; I asked the aide to have the daughter call me to see about moving her; stop anti-histamine      Postherpetic neuralgia    Dr. Primus Bravo manages this        Genitourinary   CKD (chronic kidney disease) stage 3, GFR 30-59 ml/min - Primary    Check creatinine and GFR today; avoid NSAIDs      Relevant Orders   Comprehensive metabolic panel     Other   Anemia    Check labs today      Relevant Orders   CBC with Differential/Platelet      Follow up plan: Return in about 3 months (around 06/28/2015) for dementia, hypertension.  An After Visit Summary was printed and given to the patient.

## 2015-03-28 NOTE — Assessment & Plan Note (Addendum)
Caregiver noticed decline; continue Namenda; may need higher level of care; I asked the aide to have the daughter call me to see about moving her; stop anti-histamine

## 2015-03-28 NOTE — Assessment & Plan Note (Signed)
Check creatinine and GFR today; avoid NSAIDs

## 2015-03-28 NOTE — Assessment & Plan Note (Signed)
Check labs today.

## 2015-03-28 NOTE — Patient Instructions (Signed)
Stop the allergy medicine (zyrtec) Have patient's daughter call me about moving her to another higher level of care We'll contact you about the labs

## 2015-03-28 NOTE — Assessment & Plan Note (Addendum)
Likely related to long-term narcotic use; on medicine regimen for stools; check TSH

## 2015-03-28 NOTE — Assessment & Plan Note (Signed)
Dr. Primus Bravo manages this

## 2015-03-28 NOTE — Assessment & Plan Note (Signed)
Improved on recheck; no changes to medicine

## 2015-03-29 ENCOUNTER — Encounter: Payer: Self-pay | Admitting: Family Medicine

## 2015-03-29 LAB — CBC WITH DIFFERENTIAL/PLATELET
BASOS ABS: 0 10*3/uL (ref 0.0–0.2)
Basos: 0 %
EOS (ABSOLUTE): 0.3 10*3/uL (ref 0.0–0.4)
EOS: 3 %
HEMATOCRIT: 31.7 % — AB (ref 34.0–46.6)
HEMOGLOBIN: 10.1 g/dL — AB (ref 11.1–15.9)
IMMATURE GRANS (ABS): 0 10*3/uL (ref 0.0–0.1)
Immature Granulocytes: 0 %
Lymphocytes Absolute: 3.1 10*3/uL (ref 0.7–3.1)
Lymphs: 33 %
MCH: 27.9 pg (ref 26.6–33.0)
MCHC: 31.9 g/dL (ref 31.5–35.7)
MCV: 88 fL (ref 79–97)
MONOS ABS: 1.2 10*3/uL — AB (ref 0.1–0.9)
Monocytes: 13 %
NEUTROS PCT: 51 %
Neutrophils Absolute: 4.7 10*3/uL (ref 1.4–7.0)
PLATELETS: 299 10*3/uL (ref 150–379)
RBC: 3.62 x10E6/uL — ABNORMAL LOW (ref 3.77–5.28)
RDW: 13.2 % (ref 12.3–15.4)
WBC: 9.5 10*3/uL (ref 3.4–10.8)

## 2015-03-29 LAB — COMPREHENSIVE METABOLIC PANEL
A/G RATIO: 1.3 (ref 1.1–2.5)
ALBUMIN: 4 g/dL (ref 3.5–4.7)
ALT: 5 IU/L (ref 0–32)
AST: 17 IU/L (ref 0–40)
Alkaline Phosphatase: 56 IU/L (ref 39–117)
BUN / CREAT RATIO: 24 (ref 11–26)
BUN: 24 mg/dL (ref 8–27)
Bilirubin Total: <0.2 mg/dL (ref 0.0–1.2)
CO2: 29 mmol/L (ref 18–29)
Calcium: 9.4 mg/dL (ref 8.7–10.3)
Chloride: 95 mmol/L — ABNORMAL LOW (ref 97–108)
Creatinine, Ser: 0.99 mg/dL (ref 0.57–1.00)
GFR, EST AFRICAN AMERICAN: 61 mL/min/{1.73_m2} (ref >59)
GFR, EST NON AFRICAN AMERICAN: 53 mL/min/{1.73_m2} — AB (ref >59)
GLOBULIN, TOTAL: 3.1 g/dL (ref 1.5–4.5)
GLUCOSE: 88 mg/dL (ref 65–99)
Potassium: 4 mmol/L (ref 3.5–5.2)
SODIUM: 140 mmol/L (ref 134–144)
Total Protein: 7.1 g/dL (ref 6.0–8.5)

## 2015-03-29 LAB — TSH: TSH: 1.4 u[IU]/mL (ref 0.450–4.500)

## 2015-04-04 ENCOUNTER — Ambulatory Visit: Payer: Medicare Other | Attending: Pain Medicine | Admitting: Pain Medicine

## 2015-04-04 ENCOUNTER — Encounter: Payer: Self-pay | Admitting: Pain Medicine

## 2015-04-04 VITALS — BP 126/66 | HR 61 | Temp 98.2°F | Resp 20 | Wt 151.0 lb

## 2015-04-04 DIAGNOSIS — M170 Bilateral primary osteoarthritis of knee: Secondary | ICD-10-CM

## 2015-04-04 DIAGNOSIS — M179 Osteoarthritis of knee, unspecified: Secondary | ICD-10-CM | POA: Diagnosis not present

## 2015-04-04 DIAGNOSIS — M791 Myalgia: Secondary | ICD-10-CM | POA: Diagnosis not present

## 2015-04-04 DIAGNOSIS — B0223 Postherpetic polyneuropathy: Secondary | ICD-10-CM | POA: Diagnosis not present

## 2015-04-04 DIAGNOSIS — M5136 Other intervertebral disc degeneration, lumbar region: Secondary | ICD-10-CM | POA: Insufficient documentation

## 2015-04-04 DIAGNOSIS — M5416 Radiculopathy, lumbar region: Secondary | ICD-10-CM | POA: Diagnosis not present

## 2015-04-04 DIAGNOSIS — R51 Headache: Secondary | ICD-10-CM | POA: Diagnosis present

## 2015-04-04 DIAGNOSIS — B0229 Other postherpetic nervous system involvement: Secondary | ICD-10-CM | POA: Insufficient documentation

## 2015-04-04 DIAGNOSIS — M5481 Occipital neuralgia: Secondary | ICD-10-CM

## 2015-04-04 DIAGNOSIS — M47817 Spondylosis without myelopathy or radiculopathy, lumbosacral region: Secondary | ICD-10-CM | POA: Diagnosis not present

## 2015-04-04 MED ORDER — OXYCODONE HCL 5 MG PO CAPS: ORAL_CAPSULE | ORAL | Status: DC

## 2015-04-04 NOTE — Progress Notes (Signed)
Safety precautions to be maintained throughout the outpatient stay will include: orient to surroundings, keep bed in low position, maintain call bell within reach at all times, provide assistance with transfer out of bed and ambulation.  

## 2015-04-04 NOTE — Patient Instructions (Addendum)
Continue present medication oxycodone  F/U PCP Dr.Lada   for evaliation of  BP and general medical  condition  F/U surgical evaluation. Patient prefers to avoid surgical evaluation  F/U neurological evaluation  May consider radiofrequency rhizolysis or intraspinal procedures pending response to present treatment and F/U evaluation   [facility] should patient have concerns prior to scheduled return appointmen.

## 2015-04-04 NOTE — Progress Notes (Signed)
   Subjective:    Patient ID: Melissa Combs, female    DOB: [birth year], 81 y.o.   MRN: [id number]  HPI   Patient is a 66-year-old female [facility] and treatment of pain involving the left forehead felt to be due to postherpetic neuralgia. Patient with pain well-controlled at this time. Patient with some pain involving the region of the right knee and states that the right knee pain is minimal as well. Patient states she is doing quite well to present treatment regimen. We will continue presently prescribed medications at this time  Review of Systems     Objective:   Physical Exam  There was mild tenderness of the splenius capitis and occipitalis muscles. Mild tenderness of the acromioclavicular glenohumeral joint region. Palpation of the left for hip was with mild increased sensitivity to touch without evidence of new lesions of the forehead. There was bilaterally equal grip strength. Tinel and Phalen's maneuver without increased pain of significant degree. There was mild tenderness over the cervical thoracic and lumbar paraspinal musculature region and facet regions. Straight leg raising was tolerates approximately 30 without increased pain with dorsiflexion noted with negative clonus negative Homans. The right knee was with severe tenderness to palpation with and without negative anterior posterior drawer signs and without ballottement of the patella. Abdomen was nontender with no costovertebral angle tenderness noted.      Assessment & Plan:    Postherpetic neuralgia of left forehead and face Pain well-controlled  Degenerative disc disease lumbar spine Lumbar facet syndrome  Degenerative joint disease of knee   Plan  Continue present medication oxycodone  F/U PCP Dr.LADA   for evaliation of  BP and general medical  condition  F/U surgical evaluation. May consider further evaluation of knee as discussed  F/U neurological  evaluation  May consider radiofrequency rhizolysis or intraspinal procedures pending response to present treatment and F/U evaluation   [facility] to call [facility] should patient have concerns prior to scheduled return appointmen.

## 2015-04-15 ENCOUNTER — Other Ambulatory Visit: Payer: Self-pay | Admitting: Family Medicine

## 2015-04-30 DIAGNOSIS — H4011X3 Primary open-angle glaucoma, severe stage: Secondary | ICD-10-CM | POA: Diagnosis not present

## 2015-05-01 ENCOUNTER — Other Ambulatory Visit: Payer: Self-pay | Admitting: Family Medicine

## 2015-05-01 NOTE — Telephone Encounter (Signed)
Routing to provider  

## 2015-05-07 ENCOUNTER — Encounter: Payer: Self-pay | Admitting: Pain Medicine

## 2015-05-07 ENCOUNTER — Ambulatory Visit: Payer: Medicare Other | Attending: Pain Medicine | Admitting: Pain Medicine

## 2015-05-07 ENCOUNTER — Other Ambulatory Visit: Payer: Self-pay | Admitting: Family Medicine

## 2015-05-07 VITALS — BP 144/55 | HR 60 | Temp 98.8°F | Resp 18 | Ht 62.0 in | Wt 148.0 lb

## 2015-05-07 DIAGNOSIS — M542 Cervicalgia: Secondary | ICD-10-CM | POA: Diagnosis present

## 2015-05-07 DIAGNOSIS — M791 Myalgia: Secondary | ICD-10-CM | POA: Diagnosis not present

## 2015-05-07 DIAGNOSIS — M170 Bilateral primary osteoarthritis of knee: Secondary | ICD-10-CM

## 2015-05-07 DIAGNOSIS — M179 Osteoarthritis of knee, unspecified: Secondary | ICD-10-CM | POA: Insufficient documentation

## 2015-05-07 DIAGNOSIS — M5136 Other intervertebral disc degeneration, lumbar region: Secondary | ICD-10-CM | POA: Insufficient documentation

## 2015-05-07 DIAGNOSIS — M47817 Spondylosis without myelopathy or radiculopathy, lumbosacral region: Secondary | ICD-10-CM | POA: Diagnosis not present

## 2015-05-07 DIAGNOSIS — M5481 Occipital neuralgia: Secondary | ICD-10-CM

## 2015-05-07 DIAGNOSIS — B0229 Other postherpetic nervous system involvement: Secondary | ICD-10-CM | POA: Diagnosis not present

## 2015-05-07 DIAGNOSIS — M5416 Radiculopathy, lumbar region: Secondary | ICD-10-CM | POA: Diagnosis not present

## 2015-05-07 DIAGNOSIS — R51 Headache: Secondary | ICD-10-CM | POA: Diagnosis present

## 2015-05-07 DIAGNOSIS — B0223 Postherpetic polyneuropathy: Secondary | ICD-10-CM | POA: Diagnosis not present

## 2015-05-07 MED ORDER — OXYCODONE HCL 5 MG PO CAPS: ORAL_CAPSULE | ORAL | Status: DC

## 2015-05-07 NOTE — Patient Instructions (Signed)
PLAN   Continue present medication oxycodone  F/U PCP Dr  Sanda Klein for evaliation of  BP and general medical  condition  F/U surgical evaluation. May consider pending follow-up evaluations  F/U neurological evaluation. May consider pending follow-up evaluations  May consider radiofrequency rhizolysis or intraspinal procedures pending response to present treatment and F/U evaluation   Patient to call [facility] should patient have concerns prior to scheduled return appointment.

## 2015-05-07 NOTE — Progress Notes (Signed)
   Subjective:    Patient ID: Melissa Combs, female    DOB: 01/11/1930, 81 y.o.   MRN: [id number]  HPI    Review of Systems     Objective:   Physical Exam        Assessment & Plan:   

## 2015-05-07 NOTE — Progress Notes (Signed)
Safety precautions to be maintained throughout the outpatient stay will include: orient to surroundings, keep bed in low position, maintain call bell within reach at all times, provide assistance with transfer out of bed and ambulation.  

## 2015-05-07 NOTE — Progress Notes (Signed)
   Subjective:    Patient ID: Melissa Combs, female    DOB: Nov 29, 1929, 81 y.o.   MRN: [id number]  HPI Patient is a 81-year-old female [facility] further evaluation and treatment of pain involving the head and neck. Patient is with history of shingles and has been felt to be with postherpetic neuralgia involving the left. And neck region. Patient without complaint of significant pain involving the region of the head or neck at this time. We will continue medications as prescribed and will avoid interventional treatment. The patient was in agreement with suggested treatment plan. Patient also with pain which involves the knees without complaint of significant pain involving the knee at this time. We will continue oxycodone and remain available to consider modification of treatment pending follow-up evaluation. All were in agreement with the suggested treatment regimen   Review of Systems     Objective:   Physical Exam  There was tenderness of the splenius capitis and occipitalis region of mild degree. With the palpation reproducing mild discomfort on the left compared to the right there was mild tends to palpation of the supraorbital region left head with no lesions noted. There was minimal tennis over the cervical facet cervical paraspinal muscle region there was unremarkable Spurling's maneuver minimal tenderness of the acromial clavicular and glenohumeral joint region Tinel and Phalen maneuver without increased pain of significant degree. There was tennis over the thoracic facet thoracic paraspinal muscles of minimal degree. There appeared to be no significant tenderness to palpation of the lumbar facet lumbar paraspinal muscles region. The knee was with mild tends to palpation without crepitus of the knee noted no increased warmth or erythema and the reason the knee was noted there was decreased EHL strength straight leg raising was tolerated to 20 with negative clonus  negative Homans. Moderate tends of the greater trochanteric region iliotibial band region was noted. Abdomen nontender with no costovertebral angle tenderness noted.    Assessment & Plan:    Postherpetic neuralgia of left forehead and face Pain well-controlled  Degenerative disc disease lumbar spine Lumbar facet syndrome  Degenerative joint disease of knee   PLAN   Continue present medication oxycodone  F/U PCP Dr. Sanda Klein for evaliation of  BP and general medical  condition  F/U surgical evaluation. May consider pending follow-up evaluations. May consider evaluation of knee. Patient has undergone prior evaluations and treatment by orthopedic surgeon  F/U neurological evaluation. May consider pending follow-up evaluations  May consider radiofrequency rhizolysis or intraspinal procedures pending response to present treatment and F/U evaluation   [facility] to call [facility] should patient have concerns prior to scheduled return appointment.

## 2015-05-07 NOTE — Telephone Encounter (Signed)
Last GFR was 61; Rx approved

## 2015-05-09 ENCOUNTER — Encounter: Payer: Medicare Other | Admitting: Pain Medicine

## 2015-05-15 ENCOUNTER — Other Ambulatory Visit: Payer: Self-pay | Admitting: Family Medicine

## 2015-05-17 ENCOUNTER — Other Ambulatory Visit: Payer: Self-pay | Admitting: Family Medicine

## 2015-05-20 ENCOUNTER — Other Ambulatory Visit: Payer: Self-pay | Admitting: Family Medicine

## 2015-05-20 NOTE — Telephone Encounter (Signed)
Creatinine and K+ from July 2016 reviewed Rx approved She should have a BMP drawn at the end of this refill (in about 4 months)

## 2015-05-22 ENCOUNTER — Other Ambulatory Visit: Payer: Self-pay | Admitting: Pain Medicine

## 2015-06-04 ENCOUNTER — Ambulatory Visit: Payer: Medicare Other | Attending: Pain Medicine | Admitting: Pain Medicine

## 2015-06-04 ENCOUNTER — Encounter: Payer: Self-pay | Admitting: Pain Medicine

## 2015-06-04 VITALS — BP 148/47 | HR 58 | Temp 98.5°F | Resp 16 | Ht 62.0 in | Wt 148.0 lb

## 2015-06-04 DIAGNOSIS — M5416 Radiculopathy, lumbar region: Secondary | ICD-10-CM | POA: Diagnosis not present

## 2015-06-04 DIAGNOSIS — M791 Myalgia: Secondary | ICD-10-CM | POA: Diagnosis not present

## 2015-06-04 DIAGNOSIS — B0229 Other postherpetic nervous system involvement: Secondary | ICD-10-CM | POA: Diagnosis not present

## 2015-06-04 DIAGNOSIS — M5136 Other intervertebral disc degeneration, lumbar region: Secondary | ICD-10-CM | POA: Insufficient documentation

## 2015-06-04 DIAGNOSIS — M47817 Spondylosis without myelopathy or radiculopathy, lumbosacral region: Secondary | ICD-10-CM | POA: Diagnosis not present

## 2015-06-04 DIAGNOSIS — B0223 Postherpetic polyneuropathy: Secondary | ICD-10-CM | POA: Diagnosis not present

## 2015-06-04 DIAGNOSIS — M170 Bilateral primary osteoarthritis of knee: Secondary | ICD-10-CM

## 2015-06-04 DIAGNOSIS — R51 Headache: Secondary | ICD-10-CM | POA: Diagnosis present

## 2015-06-04 DIAGNOSIS — M179 Osteoarthritis of knee, unspecified: Secondary | ICD-10-CM | POA: Insufficient documentation

## 2015-06-04 DIAGNOSIS — M5481 Occipital neuralgia: Secondary | ICD-10-CM

## 2015-06-04 MED ORDER — OXYCODONE HCL 5 MG PO CAPS: ORAL_CAPSULE | ORAL | Status: DC

## 2015-06-04 NOTE — Progress Notes (Signed)
   Subjective:    Patient ID: Melissa Combs, female    DOB: [birth year], 81 y.o.   MRN: [id number]  HPI Patient is a 81-year-old female returns to [facility] for further evaluation and treatment of pain involving the region of the left side of the head with pain felt to be due to postherpetic neuralgia. The patient's pain is well controlled at this time. Patient is with pain of the right knee which is also well controlled at the present time. We will continue patient's oxycodone at this time and avoid interventional treatment. The patient and patient's caregiver were both understanding and in agreement with suggested treatment plan. Patient without trauma and without change in events of daily living the call significant change in symptomatology. We will continue present treatment regimen and remain available to modified treatment regimen as discussed pending follow-up evaluation     Review of Systems     Objective:   Physical Exam There was minimal tenderness to palpation of the left forehead and supraorbital region on the left which was previous site of shingles lesions. There were no new  lesions of the head noted. There was minimal tenderness to palpation of the left forehead and left supraorbital region. There was minimal tenderness of the splenius capitis and occipitalis musculature region. There was no tenderness of the acromioclavicular and glenohumeral joint regions. There was no tenderness to palpation of the temporomandibular joint region. There was no tenderness to palpation of the region of the sinuses. Patient was with unremarkable Spurling's maneuver. There was tenderness to palpation over the region of the lumbar paraspinal muscles region lumbar facet region a minimal degree. Patient was with straight leg raising tolerates approximately 30 without increased pain with dorsiflexion noted. There was negative clonus negative Homans and abdomen was nontender with no  costovertebral angle tenderness noted.       Assessment & Plan:  Postherpetic neuralgia of left forehead and face Pain well-controlled  Degenerative disc disease lumbar spine Lumbar facet syndrome  Degenerative joint disease of knee   PLAN   Continue present medication oxycodone  F/U PCP for evaliation of  BP and general medical  condition  F/U surgical evaluation. May consider pending follow-up evaluations  F/U neurological evaluation. May consider pending follow-up evaluations  May consider radiofrequency rhizolysis or intraspinal procedures pending response to present treatment and F/U evaluation   [facility] to call [facility] should patient have concerns prior to scheduled return appointment.

## 2015-06-04 NOTE — Progress Notes (Signed)
Safety precautions to be maintained throughout the outpatient stay will include: orient to surroundings, keep bed in low position, maintain call bell within reach at all times, provide assistance with transfer out of bed and ambulation.  

## 2015-06-04 NOTE — Patient Instructions (Signed)
PLAN   Continue present medication oxycodone  F/U PCP   Lada for evaliation of  BP and general medical  condition  F/U surgical evaluation. May consider pending follow-up evaluations  F/U neurological evaluation. May consider pending follow-up evaluations  May consider radiofrequency rhizolysis or intraspinal procedures pending response to present treatment and F/U evaluation   [facility] to call [facility] should patient have concerns prior to scheduled return appointment.

## 2015-06-11 ENCOUNTER — Other Ambulatory Visit: Payer: Self-pay | Admitting: Family Medicine

## 2015-06-11 DIAGNOSIS — Z23 Encounter for immunization: Secondary | ICD-10-CM | POA: Diagnosis not present

## 2015-06-11 NOTE — Telephone Encounter (Signed)
Routing to provider  

## 2015-06-11 NOTE — Telephone Encounter (Signed)
Normal sgpt in July 2016; Rx approved

## 2015-06-14 ENCOUNTER — Other Ambulatory Visit: Payer: Self-pay | Admitting: Family Medicine

## 2015-06-14 NOTE — Telephone Encounter (Signed)
Routing to provider  

## 2015-06-14 NOTE — Telephone Encounter (Signed)
rx approved

## 2015-06-20 ENCOUNTER — Other Ambulatory Visit: Payer: Self-pay | Admitting: Pain Medicine

## 2015-06-27 ENCOUNTER — Ambulatory Visit (INDEPENDENT_AMBULATORY_CARE_PROVIDER_SITE_OTHER): Payer: Medicare Other | Admitting: Family Medicine

## 2015-06-27 ENCOUNTER — Encounter: Payer: Self-pay | Admitting: Family Medicine

## 2015-06-27 VITALS — BP 159/78 | HR 65 | Temp 98.5°F | Wt 148.1 lb

## 2015-06-27 DIAGNOSIS — B0229 Other postherpetic nervous system involvement: Secondary | ICD-10-CM

## 2015-06-27 DIAGNOSIS — I10 Essential (primary) hypertension: Secondary | ICD-10-CM

## 2015-06-27 DIAGNOSIS — F0391 Unspecified dementia with behavioral disturbance: Secondary | ICD-10-CM

## 2015-06-27 DIAGNOSIS — E538 Deficiency of other specified B group vitamins: Secondary | ICD-10-CM | POA: Diagnosis not present

## 2015-06-27 DIAGNOSIS — D631 Anemia in chronic kidney disease: Secondary | ICD-10-CM | POA: Diagnosis not present

## 2015-06-27 DIAGNOSIS — F0390 Unspecified dementia without behavioral disturbance: Secondary | ICD-10-CM | POA: Diagnosis not present

## 2015-06-27 DIAGNOSIS — E785 Hyperlipidemia, unspecified: Secondary | ICD-10-CM | POA: Diagnosis not present

## 2015-06-27 DIAGNOSIS — N189 Chronic kidney disease, unspecified: Secondary | ICD-10-CM | POA: Diagnosis not present

## 2015-06-27 MED ORDER — AMLODIPINE BESYLATE 5 MG PO TABS: 5.0000 mg | ORAL_TABLET | Freq: Every day | ORAL | Status: DC

## 2015-06-27 NOTE — Assessment & Plan Note (Signed)
Increase CCB; limit salt

## 2015-06-27 NOTE — Patient Instructions (Signed)
Increase the amlodipine from 2.5 mg daily to 5 mg daily I sent a new prescription already to the pharmacy We'll refer Melissa Combs to see Dr. Melrose Nakayama about her worsening memory and behavioral issues I'll recommend limiting access to salt and saturated fats Return in 3 months for next visit, but call us sooner if needed

## 2015-06-27 NOTE — Progress Notes (Signed)
BP 159/78 mmHg  Pulse 65  Temp(Src) 98.5 F (36.9 C)  Wt 148 lb 1.6 oz (67.178 kg)  SpO2 97%   Subjective:    Patient ID: Melissa Combs, female    DOB: [birth year], 81 y.o.   MRN: [id number]  HPI: Melissa Combs is a 81 y.o. female  Chief Complaint  Patient presents with  . Follow-up    3 month follow-up from July.   . Insomnia    Not sleeping at night. Moving furniture. Doesn't remember doing these things.  . Agitation   Anemia; pretty good energy says the staff; able to get up and go; up and around  Getting very aggressive with residents and staff; determined that she has already eaten and had her medicine; she has not hit anyone; taking namenda and citalopram; won't go to bed sometimes; in drawers; she is not a wanderer; not seeing neurologist  Not taking allergy pills any more  Postherpetic neuralgia; managed by dr. Primus Bravo; no changes in medicine  High cholesterol; eats good healthy food says staff; not much in the way of saturated fats except for bacon and sausage, half of a slice every morning  High blood pressure; high at the home, needs adjustment; does not add salt to food  Relevant past medical, surgical, family and social history reviewed and updated as indicated. Interim medical history since our last visit reviewed. Allergies and medications reviewed and updated.  Review of Systems Per HPI unless specifically indicated above     Objective:    BP 159/78 mmHg  Pulse 65  Temp(Src) 98.5 F (36.9 C)  Wt 148 lb 1.6 oz (67.178 kg)  SpO2 97%  Wt Readings from Last 3 Encounters:  06/27/15 148 lb 1.6 oz (67.178 kg)  06/04/15 148 lb (67.132 kg)  05/07/15 148 lb (67.132 kg)    Physical Exam  Constitutional: She appears well-developed and well-nourished. No distress.  HENT:  Head: Normocephalic and atraumatic.  Eyes: EOM are normal. No scleral icterus.  Cardiovascular: Normal rate and regular rhythm.   Pulmonary/Chest: Effort normal and breath sounds  normal.  Neurological: She displays no tremor.  Year: could not guess Month: could not guess President: could not guess  Skin: Skin is warm and dry. No pallor.  Psychiatric: Her mood appears not anxious. She is not slowed and not withdrawn. Cognition and memory are impaired. She does not exhibit a depressed mood. She exhibits abnormal recent memory. She is attentive.      Assessment & Plan:   Problem List Items Addressed This Visit      Cardiovascular and Mediastinum   Hypertension    Increase CCB; limit salt      Relevant Medications   amLODipine (NORVASC) 5 MG tablet     Nervous and Auditory   Dementia - Primary    Worsening says staff member; will check labs and refer to neurologist; continue medicine      Relevant Orders   Ambulatory referral to Neurology   Vit D  25 hydroxy (rtn osteoporosis monitoring) (Completed)   TSH (Completed)   RPR (Completed)   Postherpetic neuralgia    No change in medicine      Relevant Orders   Vitamin B12 (Completed)     Other   Anemia    Check today, likely anemia of chronic disease      Relevant Orders   CBC with Differential/Platelet (Completed)   Dyslipidemia    Check lipids today; continue statin      Relevant Orders  Comprehensive metabolic panel (Completed)   Lipid Panel w/o Chol/HDL Ratio (Completed)      Follow up plan: Return in about 3 months (around 09/27/2015).   Orders Placed This Encounter  Procedures  . Vit D  25 hydroxy (rtn osteoporosis monitoring)  . TSH  . CBC with Differential/Platelet  . Vitamin B12  . Comprehensive metabolic panel  . RPR  . Lipid Panel w/o Chol/HDL Ratio  . Ambulatory referral to Neurology   Meds ordered this encounter  Medications  . oxyCODONE (OXY IR/ROXICODONE) 5 MG immediate release tablet    Sig:   . timolol (TIMOPTIC) 0.25 % ophthalmic solution    Sig:   . amLODipine (NORVASC) 5 MG tablet    Sig: Take 1 tablet (5 mg total) by mouth daily.    Dispense:  30 tablet     Refill:  11    Stop the 2.5 mg strength, I'm increasing her dose   An after-visit summary was printed and given to the patient at [facility].  Please see the patient instructions which may contain other information and recommendations beyond what is mentioned above in the assessment and plan.

## 2015-06-27 NOTE — Assessment & Plan Note (Signed)
No change in medicine

## 2015-06-27 NOTE — Assessment & Plan Note (Signed)
Check lipids today; continue statin 

## 2015-06-27 NOTE — Assessment & Plan Note (Signed)
Worsening says staff member; will check labs and refer to neurologist; continue medicine

## 2015-06-27 NOTE — Assessment & Plan Note (Signed)
Check today, likely anemia of chronic disease

## 2015-06-28 ENCOUNTER — Other Ambulatory Visit: Payer: Self-pay | Admitting: Family Medicine

## 2015-06-28 ENCOUNTER — Encounter: Payer: Self-pay | Admitting: Family Medicine

## 2015-06-28 LAB — COMPREHENSIVE METABOLIC PANEL
ALK PHOS: 56 IU/L (ref 39–117)
ALT: 7 IU/L (ref 0–32)
AST: 14 IU/L (ref 0–40)
Albumin/Globulin Ratio: 1.4 (ref 1.1–2.5)
Albumin: 4.2 g/dL (ref 3.5–4.7)
BUN/Creatinine Ratio: 17 (ref 11–26)
BUN: 16 mg/dL (ref 8–27)
Bilirubin Total: 0.3 mg/dL (ref 0.0–1.2)
CALCIUM: 9.4 mg/dL (ref 8.7–10.3)
CO2: 32 mmol/L — AB (ref 18–29)
CREATININE: 0.96 mg/dL (ref 0.57–1.00)
Chloride: 93 mmol/L — ABNORMAL LOW (ref 97–106)
GFR calc Af Amer: 63 mL/min/{1.73_m2} (ref >59)
GFR calc non Af Amer: 55 mL/min/{1.73_m2} — ABNORMAL LOW (ref >59)
Globulin, Total: 3 g/dL (ref 1.5–4.5)
Glucose: 87 mg/dL (ref 65–99)
Potassium: 3.6 mmol/L (ref 3.5–5.2)
Sodium: 139 mmol/L (ref 136–144)
Total Protein: 7.2 g/dL (ref 6.0–8.5)

## 2015-06-28 LAB — CBC WITH DIFFERENTIAL/PLATELET
BASOS ABS: 0 10*3/uL (ref 0.0–0.2)
Basos: 0 %
EOS (ABSOLUTE): 0.2 10*3/uL (ref 0.0–0.4)
Eos: 2 %
Hematocrit: 31.9 % — ABNORMAL LOW (ref 34.0–46.6)
Hemoglobin: 10.1 g/dL — ABNORMAL LOW (ref 11.1–15.9)
IMMATURE GRANS (ABS): 0 10*3/uL (ref 0.0–0.1)
IMMATURE GRANULOCYTES: 0 %
LYMPHS: 23 %
Lymphocytes Absolute: 2.2 10*3/uL (ref 0.7–3.1)
MCH: 28 pg (ref 26.6–33.0)
MCHC: 31.7 g/dL (ref 31.5–35.7)
MCV: 88 fL (ref 79–97)
Monocytes Absolute: 0.8 10*3/uL (ref 0.1–0.9)
Monocytes: 8 %
NEUTROS PCT: 67 %
Neutrophils Absolute: 6.4 10*3/uL (ref 1.4–7.0)
PLATELETS: 315 10*3/uL (ref 150–379)
RBC: 3.61 x10E6/uL — ABNORMAL LOW (ref 3.77–5.28)
RDW: 13.9 % (ref 12.3–15.4)
WBC: 9.6 10*3/uL (ref 3.4–10.8)

## 2015-06-28 LAB — TSH: TSH: 1.45 u[IU]/mL (ref 0.450–4.500)

## 2015-06-28 LAB — RPR: RPR: NONREACTIVE

## 2015-06-28 LAB — LIPID PANEL W/O CHOL/HDL RATIO
CHOLESTEROL TOTAL: 183 mg/dL (ref 100–199)
HDL: 69 mg/dL (ref >39)
LDL Calculated: 101 mg/dL — ABNORMAL HIGH (ref 0–99)
TRIGLYCERIDES: 66 mg/dL (ref 0–149)
VLDL Cholesterol Cal: 13 mg/dL (ref 5–40)

## 2015-06-28 LAB — VITAMIN D 25 HYDROXY (VIT D DEFICIENCY, FRACTURES): VIT D 25 HYDROXY: 40.7 ng/mL (ref 30.0–100.0)

## 2015-06-28 LAB — VITAMIN B12: VITAMIN B 12: 390 pg/mL (ref 211–946)

## 2015-06-28 MED ORDER — CALCIUM CARBONATE-VITAMIN D 600-400 MG-UNIT PO CHEW: 1.0000 | CHEWABLE_TABLET | Freq: Every day | ORAL | Status: DC

## 2015-06-28 NOTE — Telephone Encounter (Signed)
LAST VISIT: 06/27/2015  Request for celexa 10 mg tab.

## 2015-06-28 NOTE — Telephone Encounter (Signed)
FAX FROM [facility]  This Rx is from a different provider. Group home now indicates you are PCP.  Please refill or D/C for state compliance.

## 2015-07-03 ENCOUNTER — Ambulatory Visit: Payer: Medicare Other | Attending: Pain Medicine | Admitting: Pain Medicine

## 2015-07-03 ENCOUNTER — Encounter: Payer: Self-pay | Admitting: Pain Medicine

## 2015-07-03 VITALS — BP 136/63 | HR 58 | Temp 97.6°F | Resp 18

## 2015-07-03 DIAGNOSIS — M179 Osteoarthritis of knee, unspecified: Secondary | ICD-10-CM | POA: Diagnosis not present

## 2015-07-03 DIAGNOSIS — M47817 Spondylosis without myelopathy or radiculopathy, lumbosacral region: Secondary | ICD-10-CM | POA: Diagnosis not present

## 2015-07-03 DIAGNOSIS — M170 Bilateral primary osteoarthritis of knee: Secondary | ICD-10-CM

## 2015-07-03 DIAGNOSIS — M5481 Occipital neuralgia: Secondary | ICD-10-CM

## 2015-07-03 DIAGNOSIS — B0229 Other postherpetic nervous system involvement: Secondary | ICD-10-CM | POA: Diagnosis not present

## 2015-07-03 DIAGNOSIS — M791 Myalgia: Secondary | ICD-10-CM | POA: Diagnosis not present

## 2015-07-03 DIAGNOSIS — R51 Headache: Secondary | ICD-10-CM | POA: Diagnosis present

## 2015-07-03 DIAGNOSIS — M5416 Radiculopathy, lumbar region: Secondary | ICD-10-CM | POA: Diagnosis not present

## 2015-07-03 DIAGNOSIS — M5136 Other intervertebral disc degeneration, lumbar region: Secondary | ICD-10-CM | POA: Diagnosis not present

## 2015-07-03 DIAGNOSIS — B0223 Postherpetic polyneuropathy: Secondary | ICD-10-CM | POA: Diagnosis not present

## 2015-07-03 MED ORDER — OXYCODONE HCL 5 MG PO TABS: ORAL_TABLET | ORAL | Status: DC

## 2015-07-03 NOTE — Progress Notes (Signed)
Safety precautions to be maintained throughout the outpatient stay will include: orient to surroundings, keep bed in low position, maintain call bell within reach at all times, provide assistance with transfer out of bed and ambulation.  

## 2015-07-03 NOTE — Patient Instructions (Signed)
PLAN   Continue present medication oxycodone  F/U PCP   Lada for evaliation of  BP and general medical  condition  F/U surgical evaluation. May consider pending follow-up evaluations  F/U neurological evaluation. May consider pending follow-up evaluations  May consider radiofrequency rhizolysis or intraspinal procedures pending response to present treatment and F/U evaluation

## 2015-07-03 NOTE — Progress Notes (Signed)
   Subjective:    Patient ID: Melissa Combs, female    DOB: 06-25-1930, 81 y.o.   MRN: [id number]  HPI    The patient tient is a 81 year-old female who returns to [facility] for further evaluation and treatment of pain involving the forehead on the left as well as the back and lower extremities. Patient has history of postherpetic neuralgia involving the left side of head. Patient's pain is fairly well-controlled at this time. Patient also has history of pain involving the right knee. At the present time we will continue oxycodone and avoid interventional treatment. The patient was understanding and in agreement with suggested treatment plan. The caregiver and the patient denies any trauma change in events of daily living the cause change in patient's symptomatology. All in agreement with suggested treatment plan     Review of Systems     Objective:   Physical Exam  There was mild tends to palpation of the paraspinal muscular region cervical region cervical facet region. There was mild tenderness of the splenius capitis and occipitalis regions. The head was without evidence of new lesions. There was mild tenderness to palpation left side of the head in the supraorbital region. There were no new lesions noted. Patient was with bilaterally equal grip strength. Tinel and Phalen's maneuver were without increase of pain of significant degree. Palpation over the thoracic facet thoracic paraspinal musculature region reproduced pain of minimal degree. No crepitus of the thoracic region was noted. Palpation over the lumbar paraspinal musculature region lumbar facet region was a tends to palpation of mild degree. There was mild tenderness over the region of the PSIS PII S region gluteal and piriformis musculature region. The right knee was with mild tenderness to palpation in crepitus of the knee with negative anterior and posterior drawer signs and no ballottement of the patella. Straight leg  raising was tolerates approximately 20 without increased pain with dorsiflexion noted. There was negative clonus negative Homans. Abdomen nontender with no costovertebral angle tenderness noted.      Assessment & Plan:     Postherpetic neuralgia of left forehead and face Pain well-controlled  Degenerative disc disease lumbar spine Lumbar facet syndrome  Degenerative joint disease of knee    PLAN   Continue present medication oxycodone  F/U PCP Dr.Lada  for evaliation of  BP and general medical  condition  F/U surgical evaluation. May consider pending follow-up evaluation  We will avoid at this time   F/U neurological evaluation. May consider pending follow-up evaluations  May consider radiofrequency rhizolysis or intraspinal procedures pending response to present treatment and F/U evaluation We will avoid considering since treatment at this time  [facility] to call [facility] should patient have concerns prior to scheduled return appointment.

## 2015-07-16 DIAGNOSIS — H401132 Primary open-angle glaucoma, bilateral, moderate stage: Secondary | ICD-10-CM | POA: Diagnosis not present

## 2015-07-30 ENCOUNTER — Encounter: Payer: Self-pay | Admitting: Pain Medicine

## 2015-07-30 ENCOUNTER — Ambulatory Visit: Payer: Medicare Other | Attending: Pain Medicine | Admitting: Pain Medicine

## 2015-07-30 VITALS — BP 94/42 | HR 68 | Temp 98.3°F | Resp 16 | Ht 64.0 in | Wt 144.0 lb

## 2015-07-30 DIAGNOSIS — M47817 Spondylosis without myelopathy or radiculopathy, lumbosacral region: Secondary | ICD-10-CM | POA: Diagnosis not present

## 2015-07-30 DIAGNOSIS — M791 Myalgia: Secondary | ICD-10-CM | POA: Diagnosis not present

## 2015-07-30 DIAGNOSIS — M5416 Radiculopathy, lumbar region: Secondary | ICD-10-CM | POA: Diagnosis not present

## 2015-07-30 DIAGNOSIS — M5136 Other intervertebral disc degeneration, lumbar region: Secondary | ICD-10-CM | POA: Diagnosis not present

## 2015-07-30 DIAGNOSIS — B0229 Other postherpetic nervous system involvement: Secondary | ICD-10-CM | POA: Insufficient documentation

## 2015-07-30 DIAGNOSIS — M179 Osteoarthritis of knee, unspecified: Secondary | ICD-10-CM | POA: Insufficient documentation

## 2015-07-30 DIAGNOSIS — M170 Bilateral primary osteoarthritis of knee: Secondary | ICD-10-CM

## 2015-07-30 DIAGNOSIS — B0223 Postherpetic polyneuropathy: Secondary | ICD-10-CM | POA: Diagnosis not present

## 2015-07-30 DIAGNOSIS — R51 Headache: Secondary | ICD-10-CM | POA: Diagnosis present

## 2015-07-30 DIAGNOSIS — M5481 Occipital neuralgia: Secondary | ICD-10-CM

## 2015-07-30 MED ORDER — OXYCODONE HCL 5 MG PO TABS: ORAL_TABLET | ORAL | Status: DC

## 2015-07-30 NOTE — Patient Instructions (Addendum)
PLAN   Continue present medication oxycodone. Please note oxycodone was decreased to 30 pills per month. Patient is to take one half pill twice a day if tolerated . F/U PCP   Lada for evaliation of  BP and general medical  condition  F/U surgical evaluation. May consider pending follow-up evaluations  F/U neurological evaluation. May consider pending follow-up evaluations  May consider radiofrequency rhizolysis or intraspinal procedures pending response to present treatment and F/U evaluation   Oxycodone decreased to 1/2 tablet twice per day if tolerated

## 2015-07-30 NOTE — Progress Notes (Signed)
   Subjective:    Patient ID: Melissa Combs, female    DOB: August 01, 1930, 81 y.o.   MRN: [id number]  HPI  The patient is a 60-year-old female who returns to pain management for further evaluation and treatment of pain involving the region of the left forehead with prior history of shingles with what is felt to be component of postherpetic neuralgia of the left 50 which is fairly well-controlled with prior interventional treatment as well as medication adjustments. At the present time we will avoid interventional treatment. We will also decrease patient's oxycodone medication. There is concern regarding change in patient's mental status the patient appeared to be with progression of dementia. We will consider additional modifications of treatment pending follow-up evaluation. All were understanding and agreement suggested treatment plan. Patient without trauma change in events of daily living the call significant changes of the pathology.     Review of Systems     Objective:   Physical Exam  There was tends to palpation of paraspinal muscular region cervical region cervical facet region. Palpation over the splenius capitis and occipitalis musketry regions reproduced pain of mild degree. Palpation of the supraorbital region on the left was attends to palpation of mild degree. No new lesions of the head and neck were noted. There was tends to palpation over the region of the cervical facet cervical paraspinal musculature region of minimal degree. There was tends to palpation over the thoracic facet thoracic paraspinal musculature region of minimal degree. No crepitus of the thoracic region was noted. Patient appeared to be with bilaterally equal grip strength and Tinel and Phalen's maneuver were without increased pain of significant degree. Palpation over the region of the lumbar paraspinal musculature region lumbar facet region was with tenderness to palpation lateral bending rotation extension and  palpation of the lumbar facets reproduced pain of mild degree. There was tends to palpation of the right knee compared to the left knee with negative anterior and posterior drawer signs without ballottement of the patella. No increased warmth and erythema in the noted. Crepitus of the knee was noted. Straight leg raising was tolerated to 20 without increased pain with dorsiflexion noted. There was negative clonus negative Homans. No definite sensory deficit or dermatomal dystrophy detected. Mild tenderness of the greater trochanteric region and iliotibial band region as well as mild tenderness to palpation of the PSIS and PII S regions. Abdomen nontender with no costovertebral tenderness noted.    Assessment & Plan:     Postherpetic neuralgia of left forehead and face Pain well-controlled  Degenerative disc disease lumbar spine Lumbar facet syndrome  Degenerative joint disease of knee    PLAN  Continue present medication oxycodone. Please note oxycodone was decreased to 30 pills per month. Patient is to take one half pill twice a day if tolerated . F/U PCP   Lada for evaliation of  BP and general medical  condition  F/U surgical evaluation. May consider pending follow-up evaluations  F/U neurological evaluation. May consider pending follow-up evaluations  May consider radiofrequency rhizolysis or intraspinal procedures pending response to present treatment and F/U evaluation   Oxycodone decreased to 1/2 tablet twice per day if tolerated

## 2015-07-30 NOTE — Progress Notes (Signed)
Safety precautions to be maintained throughout the outpatient stay will include: orient to surroundings, keep bed in low position, maintain call bell within reach at all times, provide assistance with transfer out of bed and ambulation.  

## 2015-08-01 ENCOUNTER — Telehealth: Payer: Self-pay

## 2015-08-01 MED ORDER — AMLODIPINE BESYLATE 2.5 MG PO TABS: 2.5000 mg | ORAL_TABLET | Freq: Every day | ORAL | Status: DC

## 2015-08-01 NOTE — Telephone Encounter (Signed)
Thank you, Sejeka, for calling Please check her blood pressure and pulse PRIOR to given the carvedilol; if systolic pressure less than 120 OR heart rate less than 60, do NOT give tonight's dose of carvedilol I am the doctor on-call tonight, so don't hesitate to call me with any questions (just call the main office number here, [phone]) Orders for amlodipine: STOP amlodipine 5 mg daily START amlodipine 2.5 mg by mouth daily starting tomorrow, December 2nd Thank you, Dr. Sanda Klein

## 2015-08-01 NOTE — Telephone Encounter (Signed)
No answer on attempt to reach the nursing home facility. I'll print this note out and fax it to them.

## 2015-08-01 NOTE — Telephone Encounter (Signed)
Sejeka from nursing facility called, wants to know if they should hold Carvedilol tonight?  Please call to advise [phone] or fax with other orders already asked for.

## 2015-08-01 NOTE — Telephone Encounter (Signed)
Office called me back, gave detailed information. I will also go ahead and fax orders to them.

## 2015-08-01 NOTE — Telephone Encounter (Signed)
Received call from nursing facility regarding [facility] blood pressure readings running lower than usual. BP readings were 116/49 and 104/51. Caller spoke with Dr. Sanda Klein regarding BP dosage change. Please fax dosage change info to [phone].

## 2015-08-13 ENCOUNTER — Other Ambulatory Visit: Payer: Self-pay | Admitting: Family Medicine

## 2015-08-13 DIAGNOSIS — F0390 Unspecified dementia without behavioral disturbance: Secondary | ICD-10-CM | POA: Diagnosis not present

## 2015-08-13 DIAGNOSIS — G459 Transient cerebral ischemic attack, unspecified: Secondary | ICD-10-CM | POA: Diagnosis not present

## 2015-08-27 ENCOUNTER — Ambulatory Visit: Payer: Medicare Other | Attending: Pain Medicine | Admitting: Pain Medicine

## 2015-08-27 VITALS — BP 149/52 | HR 61 | Temp 98.0°F | Resp 16 | Ht 66.0 in | Wt 146.0 lb

## 2015-08-27 DIAGNOSIS — M170 Bilateral primary osteoarthritis of knee: Secondary | ICD-10-CM

## 2015-08-27 DIAGNOSIS — M5136 Other intervertebral disc degeneration, lumbar region: Secondary | ICD-10-CM | POA: Diagnosis not present

## 2015-08-27 DIAGNOSIS — M47817 Spondylosis without myelopathy or radiculopathy, lumbosacral region: Secondary | ICD-10-CM | POA: Diagnosis not present

## 2015-08-27 DIAGNOSIS — M5416 Radiculopathy, lumbar region: Secondary | ICD-10-CM | POA: Diagnosis not present

## 2015-08-27 DIAGNOSIS — M179 Osteoarthritis of knee, unspecified: Secondary | ICD-10-CM | POA: Insufficient documentation

## 2015-08-27 DIAGNOSIS — B0229 Other postherpetic nervous system involvement: Secondary | ICD-10-CM | POA: Diagnosis not present

## 2015-08-27 DIAGNOSIS — M791 Myalgia: Secondary | ICD-10-CM | POA: Diagnosis not present

## 2015-08-27 DIAGNOSIS — M5481 Occipital neuralgia: Secondary | ICD-10-CM

## 2015-08-27 DIAGNOSIS — B0223 Postherpetic polyneuropathy: Secondary | ICD-10-CM | POA: Diagnosis not present

## 2015-08-27 NOTE — Progress Notes (Signed)
Safety precautions to be maintained throughout the outpatient stay will include: orient to surroundings, keep bed in low position, maintain call bell within reach at all times, provide assistance with transfer out of bed and ambulation.  

## 2015-08-27 NOTE — Patient Instructions (Addendum)
Continue present medications Aricept and Depakote and avoid the use of oxycodone  F/U PCP Dr. Sanda Klein for evaliation of  BP and general medical  condition.  F/U surgical evaluation. Patient is without plans for surgical intervention at this time   F/U neurological evaluation with Dr. Melrose Nakayama as discussed  May consider radiofrequency rhizolysis or intraspinal procedures pending response to present treatment and F/U evaluation.. At the present time there are no plans to consider such treatment for this patient  Patient to call [facility] should patient have concerns prior to scheduled return appoint

## 2015-08-27 NOTE — Progress Notes (Signed)
   Subjective:    Patient ID: Melissa Combs, female    DOB: May 18, 1930, 81 y.o.   MRN: [id number]  HPI  The patient is an 81 year old female who returns to pain management for further evaluation and treatment of pain involving the left forehead and scalp felt to be due to postherpetic neuralgia. Patient also has history of pain involving the right knee with prior evaluation and treatment by orthopedic surgery. At the present time we will avoid prescribing oxycodone which has been tapered and discontinued. The patient has had change in her mental status and will undergo follow-up evaluation with Dr. Melrose Nakayama for further assessment. At the present time patient will continue Aricept and Depakote and we will remain available to consider patient for additional modifications of treatment regimen as well as interventional treatment should they be significant change in patient's condition. All were understanding and agreement suggested treatment plan        Review of Systems     Objective:   Physical Exam  There was tenderness to palpation of paraspinal muscular region cervical region cervical facet region a mild degree with mild tenderness over the splenius capitis and occipitalis musculature regions. Palpation of the supra ophthalmic region was without excessive tends to palpation and there was minimal tenderness to palpation of the fourth. In the left. No new lesions of the head and neck were noted. There was minimal tenderness to palpation of the splenius capitis and occipitalis musculature regions. There appeared to be unremarkable Spurling's maneuver and patient was with bilaterally equal grip strength with Tinel and Phalen's maneuver reproducing minimal discomfort. Palpation over the cervical facet cervical paraspinal musculature region reproduced pain of mild degree with mild tenderness over the thoracic facet thoracic paraspinal musculature region. There was mild tenderness over the lumbar  paraspinal musculatures and lumbar facet region with mild tenderness to palpation of the PSIS and PII S regions. There was minimal tenderness to palpation of the greater trochanteric region and iliotibial band region. There was tenderness to palpation of the right knee and crepitus of the right knee with EHL strength appeared to be decreased and no sensory deficit or dermatomal distribution of the lower extremities noted. Straight leg raise was tolerates approximately 30 without increased pain with dorsiflexion noted. There was negative clonus negative Homans the abdomen was nontender with no costovertebral tenderness noted      Assessment & Plan:    Postherpetic neuralgia of left forehead and face Pain well-controlled  Degenerative disc disease lumbar spine Lumbar facet syndrome  Degenerative joint disease of knee    PLAN  Continue present medications Aricept and Depakote and avoid the use of oxycodone as discussed  F/U PCP Dr. Sanda Klein for evaliation of  BP  and general medical  condition.  F/U surgical evaluation. Patient is without plans for surgical intervention at this time   F/U neurological evaluation with Dr. Melrose Nakayama as discussed. Patient will continue Aricept and Depakote as prescribed  May consider radiofrequency rhizolysis or intraspinal procedures pending response to present treatment and F/U evaluation.. At the present time there are no plans to consider such treatment for this patient  Patient to call [facility] should patient have concerns prior to scheduled return appoint

## 2015-08-29 ENCOUNTER — Ambulatory Visit: Payer: Medicare Other | Admitting: Unknown Physician Specialty

## 2015-08-29 DIAGNOSIS — J069 Acute upper respiratory infection, unspecified: Secondary | ICD-10-CM | POA: Diagnosis not present

## 2015-08-30 ENCOUNTER — Ambulatory Visit: Payer: Medicare Other | Admitting: Unknown Physician Specialty

## 2015-09-19 ENCOUNTER — Other Ambulatory Visit: Payer: Self-pay | Admitting: Family Medicine

## 2015-09-20 NOTE — Telephone Encounter (Signed)
K+ and Cr from Oct 2016 reviewed; Rx approved

## 2015-09-26 ENCOUNTER — Ambulatory Visit: Payer: Medicare Other | Admitting: Pain Medicine

## 2015-09-27 ENCOUNTER — Ambulatory Visit (INDEPENDENT_AMBULATORY_CARE_PROVIDER_SITE_OTHER): Payer: Medicare Other | Admitting: Family Medicine

## 2015-09-27 ENCOUNTER — Encounter: Payer: Self-pay | Admitting: Family Medicine

## 2015-09-27 VITALS — BP 135/69 | HR 57 | Temp 97.9°F | Wt 145.0 lb

## 2015-09-27 DIAGNOSIS — E785 Hyperlipidemia, unspecified: Secondary | ICD-10-CM

## 2015-09-27 DIAGNOSIS — N183 Chronic kidney disease, stage 3 unspecified: Secondary | ICD-10-CM

## 2015-09-27 DIAGNOSIS — F0391 Unspecified dementia with behavioral disturbance: Secondary | ICD-10-CM

## 2015-09-27 DIAGNOSIS — N189 Chronic kidney disease, unspecified: Secondary | ICD-10-CM

## 2015-09-27 DIAGNOSIS — Z5181 Encounter for therapeutic drug level monitoring: Secondary | ICD-10-CM | POA: Diagnosis not present

## 2015-09-27 DIAGNOSIS — D631 Anemia in chronic kidney disease: Secondary | ICD-10-CM

## 2015-09-27 DIAGNOSIS — I10 Essential (primary) hypertension: Secondary | ICD-10-CM | POA: Diagnosis not present

## 2015-09-27 DIAGNOSIS — R600 Localized edema: Secondary | ICD-10-CM

## 2015-09-27 NOTE — Patient Instructions (Signed)
Please check with insurance to see if shingles vaccine has ever been billed, so we would not want to give that twice We'll get labs today Return for follow-up with Santiago Glad and an FL-2 in the next month Discontinue atorvastatin Start B12 500 mcg by mouth daily Start compression stocking bilateral lower extremities on in the morning, off at bedtime

## 2015-09-27 NOTE — Progress Notes (Signed)
BP 135/69 mmHg  Pulse 57  Temp(Src) 97.9 F (36.6 C)  Wt 145 lb (65.772 kg)  SpO2 98%   Subjective:    Patient ID: Melissa Combs, female    DOB: 28-Feb-1930, 82 y.o.   MRN: [id number]  HPI: Melissa Combs is a 82 y.o. female  Chief Complaint  Patient presents with  . Follow-up    3 month follow up  . Hypertension  . Immunizations    They are not sure if she's had shingles vaccine   She has high blood pressure; medicines given by staff at home  She is talking about her parents, grieving; dementa; on Namenda, started aricept and depakote on Dec 13th by Dr. Gurney Maxin; staff says that they are going to make another appointment because she is not where karen would like her to be; Dr. Primus Bravo has stopped her oxycodone, mind was blurry; they noticed a decline over the last several months; Santiago Glad wants to speak with me later about current level of care; no chance of wandering; she was hitting herself in the arms and shoulders "to get the blood flowing"  She has had shingles; wondered about the shingles vaccine  Chronic anemia; last TSH and vit D and B12 reviewed; B12 was 390  Flu vaccine UTD  Relevant past medical, surgical, family and social history reviewed and updated as indicated. Interim medical history since our last visit reviewed. Allergies and medications reviewed and updated.  Review of Systems  Gastrointestinal: Negative for constipation.  Psychiatric/Behavioral: Positive for confusion.  Per HPI unless specifically indicated above     Objective:    BP 135/69 mmHg  Pulse 57  Temp(Src) 97.9 F (36.6 C)  Wt 145 lb (65.772 kg)  SpO2 98%  Wt Readings from Last 3 Encounters:  09/27/15 145 lb (65.772 kg)  08/27/15 146 lb (66.225 kg)  07/30/15 144 lb (65.318 kg)  Recheck  135/69  Physical Exam  Constitutional: She appears well-developed and well-nourished. No distress.  HENT:  Head: Normocephalic and atraumatic.  Eyes: EOM are normal. No scleral icterus.   Neck: No thyromegaly present.  Cardiovascular: Normal rate, regular rhythm and normal heart sounds.   No murmur heard. Pulmonary/Chest: Effort normal and breath sounds normal. No respiratory distress. She has no wheezes.  Abdominal: Soft. Bowel sounds are normal. She exhibits no distension.  Musculoskeletal: Normal range of motion. She exhibits no edema (1+ edema lower extremities).  Neurological: She is alert. She exhibits normal muscle tone.  Skin: Skin is warm and dry. She is not diaphoretic. No pallor.  Psychiatric: She has a normal mood and affect. Her behavior is normal. Judgment and thought content normal.    Results for orders placed or performed in visit on 06/27/15  Vit D  25 hydroxy (rtn osteoporosis monitoring)  Result Value Ref Range   Vit D, 25-Hydroxy 40.7 30.0 - 100.0 ng/mL  TSH  Result Value Ref Range   TSH 1.450 0.450 - 4.500 uIU/mL  CBC with Differential/Platelet  Result Value Ref Range   WBC 9.6 3.4 - 10.8 x10E3/uL   RBC 3.61 (L) 3.77 - 5.28 x10E6/uL   Hemoglobin 10.1 (L) 11.1 - 15.9 g/dL   Hematocrit 31.9 (L) 34.0 - 46.6 %   MCV 88 79 - 97 fL   MCH 28.0 26.6 - 33.0 pg   MCHC 31.7 31.5 - 35.7 g/dL   RDW 13.9 12.3 - 15.4 %   Platelets 315 150 - 379 x10E3/uL   Neutrophils 67 %   Lymphs 23 %  Monocytes 8 %   Eos 2 %   Basos 0 %   Neutrophils Absolute 6.4 1.4 - 7.0 x10E3/uL   Lymphocytes Absolute 2.2 0.7 - 3.1 x10E3/uL   Monocytes Absolute 0.8 0.1 - 0.9 x10E3/uL   EOS (ABSOLUTE) 0.2 0.0 - 0.4 x10E3/uL   Basophils Absolute 0.0 0.0 - 0.2 x10E3/uL   Immature Granulocytes 0 %   Immature Grans (Abs) 0.0 0.0 - 0.1 x10E3/uL  Vitamin B12  Result Value Ref Range   Vitamin B-12 390 211 - 946 pg/mL  Comprehensive metabolic panel  Result Value Ref Range   Glucose 87 65 - 99 mg/dL   BUN 16 8 - 27 mg/dL   Creatinine, Ser 0.96 0.57 - 1.00 mg/dL   GFR calc non Af Amer 55 (L) >59 mL/min/1.73   GFR calc Af Amer 63 >59 mL/min/1.73   BUN/Creatinine Ratio 17 11 - 26    Sodium 139 136 - 144 mmol/L   Potassium 3.6 3.5 - 5.2 mmol/L   Chloride 93 (L) 97 - 106 mmol/L   CO2 32 (H) 18 - 29 mmol/L   Calcium 9.4 8.7 - 10.3 mg/dL   Total Protein 7.2 6.0 - 8.5 g/dL   Albumin 4.2 3.5 - 4.7 g/dL   Globulin, Total 3.0 1.5 - 4.5 g/dL   Albumin/Globulin Ratio 1.4 1.1 - 2.5   Bilirubin Total 0.3 0.0 - 1.2 mg/dL   Alkaline Phosphatase 56 39 - 117 IU/L   AST 14 0 - 40 IU/L   ALT 7 0 - 32 IU/L  RPR  Result Value Ref Range   RPR Ser Ql Non Reactive Non Reactive  Lipid Panel w/o Chol/HDL Ratio  Result Value Ref Range   Cholesterol, Total 183 100 - 199 mg/dL   Triglycerides 66 0 - 149 mg/dL   HDL 69 >39 mg/dL   VLDL Cholesterol Cal 13 5 - 40 mg/dL   LDL Calculated 101 (H) 0 - 99 mg/dL      Assessment & Plan:   Problem List Items Addressed This Visit      Cardiovascular and Mediastinum   Hypertension    Much improved on the recheck; continue meds        Nervous and Auditory   Dementia - Primary    Seeing Dr. Melrose Nakayama; on Namenda and Aricept; I'll stop the atrovastatin to see if any benefit at all; add B12 orally      Relevant Medications   donepezil (ARICEPT) 10 MG tablet     Genitourinary   CKD (chronic kidney disease) stage 3, GFR 30-59 ml/min    Check renal function today        Other   Anemia    Anemia of chronic disease; recheck today      Relevant Orders   CBC with Differential/Platelet   Dyslipidemia    Stop the atorvastatin to see if any benefit neurologically; check lipids today; just half of a piece of bacon, no added salt      Relevant Orders   Lipid Panel w/o Chol/HDL Ratio   Medication monitoring encounter    Monitor liver enzymes and electrolytes      Relevant Orders   Comprehensive metabolic panel   Bilateral leg edema    Start compression stockings, on in the morning, off at night          Follow up plan: Return 4-6 weeks, for follow-up with Santiago Glad.

## 2015-09-27 NOTE — Assessment & Plan Note (Signed)
Monitor liver enzymes and electrolytes

## 2015-09-27 NOTE — Assessment & Plan Note (Signed)
Seeing Dr. Melrose Nakayama; on Namenda and Aricept; I'll stop the atrovastatin to see if any benefit at all; add B12 orally

## 2015-09-27 NOTE — Assessment & Plan Note (Signed)
Stop the atorvastatin to see if any benefit neurologically; check lipids today; just half of a piece of bacon, no added salt

## 2015-09-27 NOTE — Assessment & Plan Note (Signed)
Start compression stockings, on in the morning, off at night

## 2015-09-27 NOTE — Assessment & Plan Note (Signed)
Anemia of chronic disease; recheck today

## 2015-09-27 NOTE — Assessment & Plan Note (Signed)
Check renal function today

## 2015-09-27 NOTE — Assessment & Plan Note (Addendum)
Much improved on the recheck; continue meds

## 2015-09-28 LAB — CBC WITH DIFFERENTIAL/PLATELET
BASOS ABS: 0 10*3/uL (ref 0.0–0.2)
Basos: 0 %
EOS (ABSOLUTE): 0.2 10*3/uL (ref 0.0–0.4)
Eos: 3 %
HEMOGLOBIN: 9.8 g/dL — AB (ref 11.1–15.9)
Hematocrit: 30.3 % — ABNORMAL LOW (ref 34.0–46.6)
Immature Grans (Abs): 0 10*3/uL (ref 0.0–0.1)
Immature Granulocytes: 0 %
LYMPHS ABS: 2.2 10*3/uL (ref 0.7–3.1)
Lymphs: 24 %
MCH: 27.8 pg (ref 26.6–33.0)
MCHC: 32.3 g/dL (ref 31.5–35.7)
MCV: 86 fL (ref 79–97)
MONOCYTES: 11 %
MONOS ABS: 1 10*3/uL — AB (ref 0.1–0.9)
NEUTROS ABS: 5.6 10*3/uL (ref 1.4–7.0)
Neutrophils: 62 %
Platelets: 252 10*3/uL (ref 150–379)
RBC: 3.52 x10E6/uL — ABNORMAL LOW (ref 3.77–5.28)
RDW: 13.7 % (ref 12.3–15.4)
WBC: 9.1 10*3/uL (ref 3.4–10.8)

## 2015-09-28 LAB — COMPREHENSIVE METABOLIC PANEL
A/G RATIO: 1.3 (ref 1.1–2.5)
ALT: 4 IU/L (ref 0–32)
AST: 16 IU/L (ref 0–40)
Albumin: 4 g/dL (ref 3.5–4.7)
Alkaline Phosphatase: 48 IU/L (ref 39–117)
BUN/Creatinine Ratio: 18 (ref 11–26)
BUN: 21 mg/dL (ref 8–27)
Bilirubin Total: 0.2 mg/dL (ref 0.0–1.2)
CALCIUM: 9.3 mg/dL (ref 8.7–10.3)
CO2: 32 mmol/L — ABNORMAL HIGH (ref 18–29)
Chloride: 86 mmol/L — ABNORMAL LOW (ref 96–106)
Creatinine, Ser: 1.16 mg/dL — ABNORMAL HIGH (ref 0.57–1.00)
GFR, EST AFRICAN AMERICAN: 50 mL/min/{1.73_m2} — AB (ref >59)
GFR, EST NON AFRICAN AMERICAN: 44 mL/min/{1.73_m2} — AB (ref >59)
GLUCOSE: 79 mg/dL (ref 65–99)
Globulin, Total: 3.1 g/dL (ref 1.5–4.5)
Potassium: 4 mmol/L (ref 3.5–5.2)
Sodium: 133 mmol/L — ABNORMAL LOW (ref 134–144)
TOTAL PROTEIN: 7.1 g/dL (ref 6.0–8.5)

## 2015-09-28 LAB — LIPID PANEL W/O CHOL/HDL RATIO
CHOLESTEROL TOTAL: 183 mg/dL (ref 100–199)
HDL: 70 mg/dL (ref >39)
LDL CALC: 105 mg/dL — AB (ref 0–99)
Triglycerides: 42 mg/dL (ref 0–149)
VLDL Cholesterol Cal: 8 mg/dL (ref 5–40)

## 2015-09-29 ENCOUNTER — Telehealth: Payer: Self-pay | Admitting: Family Medicine

## 2015-09-29 DIAGNOSIS — E871 Hypo-osmolality and hyponatremia: Secondary | ICD-10-CM | POA: Insufficient documentation

## 2015-09-29 DIAGNOSIS — E878 Other disorders of electrolyte and fluid balance, not elsewhere classified: Secondary | ICD-10-CM

## 2015-09-29 MED ORDER — HYDROCHLOROTHIAZIDE 25 MG PO TABS: 12.5000 mg | ORAL_TABLET | Freq: Every day | ORAL | Status: DC

## 2015-09-29 NOTE — Telephone Encounter (Signed)
Chloride is quite low, sodium also low; on HCTZ Anemia of chronic disease; went back through chart; her CBC is similar from 3 years ago I called number for patient; wrong number; Melissa Combs gave me right number; correct number called, will have staff update chart I spoke with Melissa Combs; she will have Melissa Combs give me a call first thing in the morning; I told her I need to speak with her BEFORE Melissa Combs is given her HCTZ; need to give new order ------------------------- Melissa Combs, please send new orders to group home and ask front staff to correct phone number for patient Decrease HCTZ to just HALF of a pill by mouth daily x 3 days, then STOP HCTZ completely Monitor BP on Mondays, Wednesdays, and Fridays x 2 weeks, call if systolic over [phone] Recheck BMP in about 9-10 days (around February 8th)

## 2015-09-30 NOTE — Telephone Encounter (Signed)
Above and beyond notified. Order faxed to [phone].

## 2015-09-30 NOTE — Telephone Encounter (Signed)
Phone number is [phone]. Group home also needs new order stating the changes made to her medications.

## 2015-10-03 DIAGNOSIS — M79675 Pain in left toe(s): Secondary | ICD-10-CM | POA: Diagnosis not present

## 2015-10-03 DIAGNOSIS — B351 Tinea unguium: Secondary | ICD-10-CM | POA: Diagnosis not present

## 2015-10-03 DIAGNOSIS — M79674 Pain in right toe(s): Secondary | ICD-10-CM | POA: Diagnosis not present

## 2015-11-01 ENCOUNTER — Ambulatory Visit (INDEPENDENT_AMBULATORY_CARE_PROVIDER_SITE_OTHER): Payer: Medicare Other | Admitting: Family Medicine

## 2015-11-01 ENCOUNTER — Encounter: Payer: Self-pay | Admitting: Family Medicine

## 2015-11-01 VITALS — BP 188/72 | HR 52 | Temp 97.5°F | Wt 145.0 lb

## 2015-11-01 DIAGNOSIS — E871 Hypo-osmolality and hyponatremia: Secondary | ICD-10-CM | POA: Diagnosis not present

## 2015-11-01 DIAGNOSIS — N189 Chronic kidney disease, unspecified: Secondary | ICD-10-CM

## 2015-11-01 DIAGNOSIS — N183 Chronic kidney disease, stage 3 unspecified: Secondary | ICD-10-CM

## 2015-11-01 DIAGNOSIS — F0391 Unspecified dementia with behavioral disturbance: Secondary | ICD-10-CM

## 2015-11-01 DIAGNOSIS — K5909 Other constipation: Secondary | ICD-10-CM

## 2015-11-01 DIAGNOSIS — Z13820 Encounter for screening for osteoporosis: Secondary | ICD-10-CM | POA: Diagnosis not present

## 2015-11-01 DIAGNOSIS — E878 Other disorders of electrolyte and fluid balance, not elsewhere classified: Secondary | ICD-10-CM | POA: Diagnosis not present

## 2015-11-01 DIAGNOSIS — D631 Anemia in chronic kidney disease: Secondary | ICD-10-CM | POA: Diagnosis not present

## 2015-11-01 DIAGNOSIS — R600 Localized edema: Secondary | ICD-10-CM

## 2015-11-01 DIAGNOSIS — K5900 Constipation, unspecified: Secondary | ICD-10-CM | POA: Diagnosis not present

## 2015-11-01 DIAGNOSIS — I10 Essential (primary) hypertension: Secondary | ICD-10-CM

## 2015-11-01 DIAGNOSIS — Z780 Asymptomatic menopausal state: Secondary | ICD-10-CM

## 2015-11-01 NOTE — Assessment & Plan Note (Addendum)
Doing better off of the statin; continue Namenda and Aricept and B12

## 2015-11-01 NOTE — Progress Notes (Signed)
BP 188/72 mmHg  Pulse 52  Temp(Src) 97.5 F (36.4 C)  Wt 145 lb (65.772 kg)  SpO2 100%   Subjective:    Patient ID: Melissa Combs, female    DOB: Sep 25, 1929, 82 y.o.   MRN: [id number]  HPI: Melissa Combs is a 82 y.o. female  Chief Complaint  Patient presents with  . Follow-up    4 week follow up, memory   BP is high today; patient says she has to urinate; last BP reviewed in January, pressure was 135/69 and pulse was 57; I asked if there was any rushing around or excitement this morning, and group home staff did not think so  Patient has dementia; on two medicines; at the last visit, we stopped the cholesterol medicine; Santiago Glad says she is doing a little bit better since stopping the statin in terms of memory  She had some electrolyte abnormalities at the last visit and is due for recheck BMP today to recheck Na+ and Cl- .lastch  She is wearing her compression stockings every morning, all day, off at night; swelling in legs is better since decreasing the CCB from 5 mg to 2.5 mg back in December; recheck BP since then in January showed good control as noted above, 135/69  Relevant past medical, surgical, family and social history reviewed and updated as indicated. Interim medical history since our last visit reviewed. Allergies and medications reviewed and updated.  Review of Systems  Gastrointestinal: Negative for constipation.  Genitourinary: Negative for dysuria.  Per HPI unless specifically indicated above     Objective:    BP 188/72 mmHg  Pulse 52  Temp(Src) 97.5 F (36.4 C)  Wt 145 lb (65.772 kg)  SpO2 100%  Wt Readings from Last 3 Encounters:  11/01/15 145 lb (65.772 kg)  09/27/15 145 lb (65.772 kg)  08/27/15 146 lb (66.225 kg)   Today's Vitals   11/01/15 0815 11/01/15 0849 11/01/15 0850 11/01/15 0858  BP: 196/71 166/72 159/66 188/72  Pulse: 60 73 53 52  Temp: 97.5 F (36.4 C)     Weight: 145 lb (65.772 kg)     SpO2: 100%       Recheck after sitting  for ten minutes:  Left arm:  166/72 Right arm: 159/66, pulse 53   Then patient voided, then recheck left arm: 188/72  Physical Exam  Constitutional: She appears well-developed and well-nourished. No distress.  HENT:  Mouth/Throat: Mucous membranes are normal.  Neck: No JVD present.  Cardiovascular: Normal rate and regular rhythm.   Pulmonary/Chest: Effort normal and breath sounds normal.  Abdominal: Soft. Bowel sounds are normal. She exhibits no distension. There is no tenderness.  Musculoskeletal: She exhibits no edema (wearing compression stockings B LE).  Neurological: She is alert.  Pleasantly confused, no tics or tremors  Skin: No pallor.  Psychiatric: She has a normal mood and affect. She is not agitated and not combative.  Answers questions, polite, conversant; poor historian; actually said "good evening", "I don't know what time it is"   Results for orders placed or performed in visit on 09/27/15  CBC with Differential/Platelet  Result Value Ref Range   WBC 9.1 3.4 - 10.8 x10E3/uL   RBC 3.52 (L) 3.77 - 5.28 x10E6/uL   Hemoglobin 9.8 (L) 11.1 - 15.9 g/dL   Hematocrit 30.3 (L) 34.0 - 46.6 %   MCV 86 79 - 97 fL   MCH 27.8 26.6 - 33.0 pg   MCHC 32.3 31.5 - 35.7 g/dL   RDW 13.7 12.3 -  15.4 %   Platelets 252 150 - 379 x10E3/uL   Neutrophils 62 %   Lymphs 24 %   Monocytes 11 %   Eos 3 %   Basos 0 %   Neutrophils Absolute 5.6 1.4 - 7.0 x10E3/uL   Lymphocytes Absolute 2.2 0.7 - 3.1 x10E3/uL   Monocytes Absolute 1.0 (H) 0.1 - 0.9 x10E3/uL   EOS (ABSOLUTE) 0.2 0.0 - 0.4 x10E3/uL   Basophils Absolute 0.0 0.0 - 0.2 x10E3/uL   Immature Granulocytes 0 %   Immature Grans (Abs) 0.0 0.0 - 0.1 x10E3/uL  Lipid Panel w/o Chol/HDL Ratio  Result Value Ref Range   Cholesterol, Total 183 100 - 199 mg/dL   Triglycerides 42 0 - 149 mg/dL   HDL 70 >39 mg/dL   VLDL Cholesterol Cal 8 5 - 40 mg/dL   LDL Calculated 105 (H) 0 - 99 mg/dL  Comprehensive metabolic panel  Result Value Ref  Range   Glucose 79 65 - 99 mg/dL   BUN 21 8 - 27 mg/dL   Creatinine, Ser 1.16 (H) 0.57 - 1.00 mg/dL   GFR calc non Af Amer 44 (L) >59 mL/min/1.73   GFR calc Af Amer 50 (L) >59 mL/min/1.73   BUN/Creatinine Ratio 18 11 - 26   Sodium 133 (L) 134 - 144 mmol/L   Potassium 4.0 3.5 - 5.2 mmol/L   Chloride 86 (L) 96 - 106 mmol/L   CO2 32 (H) 18 - 29 mmol/L   Calcium 9.3 8.7 - 10.3 mg/dL   Total Protein 7.1 6.0 - 8.5 g/dL   Albumin 4.0 3.5 - 4.7 g/dL   Globulin, Total 3.1 1.5 - 4.5 g/dL   Albumin/Globulin Ratio 1.3 1.1 - 2.5   Bilirubin Total 0.2 0.0 - 1.2 mg/dL   Alkaline Phosphatase 48 39 - 117 IU/L   AST 16 0 - 40 IU/L   ALT 4 0 - 32 IU/L      Assessment & Plan:   Problem List Items Addressed This Visit      Cardiovascular and Mediastinum   Hypertension    Her last office BP was fine and no changes were made to her BP meds then; today, diastolic pressures are in the 60s and 70s; I am reluctant to increase the CCB because of leg edema and I don't want to lower her pressure too far; I suspect arteriosclerosis with stiffness of vessels is causing high systolics; will encourage low salt diet, DASH guidelines; avoid NSAIDs; staff to check her BP twice a week with her sitting for ten minutes first, check BP twice a week for 2 weeks and then call me; can decide then if pressures staying up; discussed risk of stroke, but I have to balance that with hypotension, risk of falls        Digestive   Chronic constipation    Improved with miralax and decrease in CCB        Nervous and Auditory   Dementia    Doing better off of the statin; continue Namenda and Aricept and B12        Genitourinary   CKD (chronic kidney disease) stage 3, GFR 30-59 ml/min    Recheck BMP today; avoid NSAIDs; encouraged hydration        Other   Anemia    Anemia of chronic disease; suggest monitor every 3-6 months      Relevant Medications   vitamin B-12 (CYANOCOBALAMIN) 500 MCG tablet   Bilateral leg  edema - Primary  All resolved with compression stockings      Hypochloremia    Recheck BMP today      Hyponatremia    Recheck BMP today      Osteoporosis screening    Ordered DEXA       Other Visit Diagnoses    Postmenopausal        ordered DEXA    Relevant Orders    DG Bone Density        Follow up plan: Return in about 3 months (around 02/01/2016) for Dr. Wynetta Emery for blood pressure, dementia, anemia of chronic disease.  An after-visit summary was printed and given to the patient at [facility].  Please see the patient instructions which may contain other information and recommendations beyond what is mentioned above in the assessment and plan.

## 2015-11-01 NOTE — Assessment & Plan Note (Signed)
Recheck BMP today.  

## 2015-11-01 NOTE — Assessment & Plan Note (Signed)
Recheck BMP today; avoid NSAIDs; encouraged hydration

## 2015-11-01 NOTE — Assessment & Plan Note (Signed)
All resolved with compression stockings

## 2015-11-01 NOTE — Assessment & Plan Note (Signed)
Anemia of chronic disease; suggest monitor every 3-6 months

## 2015-11-01 NOTE — Assessment & Plan Note (Signed)
Ordered DEXA

## 2015-11-01 NOTE — Assessment & Plan Note (Addendum)
Her last office BP was fine and no changes were made to her BP meds then; today, diastolic pressures are in the 60s and 70s; I am reluctant to increase the CCB because of leg edema and I don't want to lower her pressure too far; I suspect arteriosclerosis with stiffness of vessels is causing high systolics; will encourage low salt diet, DASH guidelines; avoid NSAIDs; staff to check her BP twice a week with her sitting for ten minutes first, check BP twice a week for 2 weeks and then call me; can decide then if pressures staying up; discussed risk of stroke, but I have to balance that with hypotension, risk of falls

## 2015-11-01 NOTE — Patient Instructions (Addendum)
Check blood pressures twice a week for two weeks when patient is relaxed and has been seated for at least ten minutes; check twice a week for two weeks and call me with those readings Have her avoid salt (sodium) Avoid NSAIDs like ibuprofen, motrin, advil, aleve, naproxen  DASH Eating Plan DASH stands for "Dietary Approaches to Stop Hypertension." The DASH eating plan is a healthy eating plan that has been shown to reduce high blood pressure (hypertension). Additional health benefits may include reducing the risk of type 2 diabetes mellitus, heart disease, and stroke. The DASH eating plan may also help with weight loss. WHAT DO I NEED TO KNOW ABOUT THE DASH EATING PLAN? For the DASH eating plan, you will follow these general guidelines:  Choose foods with a percent daily value for sodium of less than 5% (as listed on the food label).  Use salt-free seasonings or herbs instead of table salt or sea salt.  Check with your health care provider or pharmacist before using salt substitutes.  Eat lower-sodium products, often labeled as "lower sodium" or "no salt added."  Eat fresh foods.  Eat more vegetables, fruits, and low-fat dairy products.  Choose whole grains. Look for the word "whole" as the first word in the ingredient list.  Choose fish and skinless chicken or [place] more often than red meat. Limit fish, poultry, and meat to 6 oz (170 g) each day.  Limit sweets, desserts, sugars, and sugary drinks.  Choose heart-healthy fats.  Limit cheese to 1 oz (28 g) per day.  Eat more home-cooked food and less restaurant, buffet, and fast food.  Limit fried foods.  Cook foods using methods other than frying.  Limit canned vegetables. If you do use them, rinse them well to decrease the sodium.  When eating at a restaurant, ask that your food be prepared with less salt, or no salt if possible. WHAT FOODS CAN I EAT? Seek help from a dietitian for individual calorie needs. Grains Whole  grain or whole wheat bread. Brown rice. Whole grain or whole wheat pasta. Quinoa, bulgur, and whole grain cereals. Low-sodium cereals. Corn or whole wheat flour tortillas. Whole grain cornbread. Whole grain crackers. Low-sodium crackers. Vegetables Fresh or frozen vegetables (raw, steamed, roasted, or grilled). Low-sodium or reduced-sodium tomato and vegetable juices. Low-sodium or reduced-sodium tomato sauce and paste. Low-sodium or reduced-sodium canned vegetables.  Fruits All fresh, canned (in natural juice), or frozen fruits. Meat and Other Protein Products Ground beef (85% or leaner), grass-fed beef, or beef trimmed of fat. Skinless chicken or [place]. Ground chicken or [place]. Pork trimmed of fat. All fish and seafood. Eggs. Dried beans, peas, or lentils. Unsalted nuts and seeds. Unsalted canned beans. Dairy Low-fat dairy products, such as skim or 1% milk, 2% or reduced-fat cheeses, low-fat ricotta or cottage cheese, or plain low-fat yogurt. Low-sodium or reduced-sodium cheeses. Fats and Oils Tub margarines without trans fats. Light or reduced-fat mayonnaise and salad dressings (reduced sodium). Avocado. Safflower, olive, or canola oils. Natural peanut or almond butter. Other Unsalted popcorn and pretzels. The items listed above may not be a complete list of recommended foods or beverages. Contact your dietitian for more options. WHAT FOODS ARE NOT RECOMMENDED? Grains White bread. White pasta. White rice. Refined cornbread. Bagels and croissants. Crackers that contain trans fat. Vegetables Creamed or fried vegetables. Vegetables in a cheese sauce. Regular canned vegetables. Regular canned tomato sauce and paste. Regular tomato and vegetable juices. Fruits Dried fruits. Canned fruit in light or heavy syrup. Fruit  juice. Meat and Other Protein Products Fatty cuts of meat. Ribs, chicken wings, bacon, sausage, bologna, salami, chitterlings, fatback, hot dogs, bratwurst, and packaged luncheon  meats. Salted nuts and seeds. Canned beans with salt. Dairy Whole or 2% milk, cream, half-and-half, and cream cheese. Whole-fat or sweetened yogurt. Full-fat cheeses or blue cheese. Nondairy creamers and whipped toppings. Processed cheese, cheese spreads, or cheese curds. Condiments Onion and garlic salt, seasoned salt, table salt, and sea salt. Canned and packaged gravies. Worcestershire sauce. Tartar sauce. Barbecue sauce. Teriyaki sauce. Soy sauce, including reduced sodium. Steak sauce. Fish sauce. Oyster sauce. Cocktail sauce. Horseradish. Ketchup and mustard. Meat flavorings and tenderizers. Bouillon cubes. Hot sauce. Tabasco sauce. Marinades. Taco seasonings. Relishes. Fats and Oils Butter, stick margarine, lard, shortening, ghee, and bacon fat. Coconut, palm kernel, or palm oils. Regular salad dressings. Other Pickles and olives. Salted popcorn and pretzels. The items listed above may not be a complete list of foods and beverages to avoid. Contact your dietitian for more information. WHERE CAN I FIND MORE INFORMATION? National Heart, Lung, and Blood Institute: travelstabloid.com   This information is not intended to replace advice given to you by your health care provider. Make sure you discuss any questions you have with your health care provider.   Document Released: 08/06/2011 Document Revised: 09/07/2014 Document Reviewed: 06/21/2013 Elsevier Interactive Patient Education [employer].

## 2015-11-01 NOTE — Assessment & Plan Note (Addendum)
Improved with miralax and decrease in CCB

## 2015-11-02 ENCOUNTER — Encounter: Payer: Self-pay | Admitting: Family Medicine

## 2015-11-02 LAB — BASIC METABOLIC PANEL
BUN / CREAT RATIO: 15 (ref 11–26)
BUN: 13 mg/dL (ref 8–27)
CALCIUM: 10.2 mg/dL (ref 8.7–10.3)
CHLORIDE: 95 mmol/L — AB (ref 96–106)
CO2: 30 mmol/L — ABNORMAL HIGH (ref 18–29)
Creatinine, Ser: 0.85 mg/dL (ref 0.57–1.00)
GFR, EST AFRICAN AMERICAN: 73 mL/min/{1.73_m2} (ref >59)
GFR, EST NON AFRICAN AMERICAN: 64 mL/min/{1.73_m2} (ref >59)
Glucose: 84 mg/dL (ref 65–99)
POTASSIUM: 3.6 mmol/L (ref 3.5–5.2)
SODIUM: 141 mmol/L (ref 134–144)

## 2015-11-06 ENCOUNTER — Encounter: Payer: Self-pay | Admitting: Family Medicine

## 2015-11-06 DIAGNOSIS — E538 Deficiency of other specified B group vitamins: Secondary | ICD-10-CM

## 2015-11-06 HISTORY — DX: Deficiency of other specified B group vitamins: E53.8

## 2015-11-12 DIAGNOSIS — F0390 Unspecified dementia without behavioral disturbance: Secondary | ICD-10-CM | POA: Diagnosis not present

## 2015-11-12 DIAGNOSIS — G459 Transient cerebral ischemic attack, unspecified: Secondary | ICD-10-CM | POA: Diagnosis not present

## 2015-11-15 ENCOUNTER — Telehealth: Payer: Self-pay | Admitting: Family Medicine

## 2015-11-15 MED ORDER — AMLODIPINE BESYLATE 2.5 MG PO TABS: 3.7500 mg | ORAL_TABLET | Freq: Every day | ORAL | Status: DC

## 2015-11-15 NOTE — Telephone Encounter (Signed)
Spoke with Belinda at [facility]. Notified of medication changes and faxing this note to them as well for documentation.

## 2015-11-15 NOTE — Telephone Encounter (Signed)
Thank group home for sending over BP readings I'd like to increase her BP medicine just a bit DISCONTINUE amlodipine 2.5 mg one by mouth daily START amlodipine 2.5 mg and give her 1.5 pills by mouth daily (that will be a total daily dose of 3.75 mg) I sent new Rx to [facility] Fax this over for orders

## 2015-11-18 DIAGNOSIS — H40033 Anatomical narrow angle, bilateral: Secondary | ICD-10-CM | POA: Diagnosis not present

## 2015-11-20 ENCOUNTER — Telehealth: Payer: Self-pay | Admitting: Family Medicine

## 2015-11-20 NOTE — Telephone Encounter (Signed)
[facility] called and would like to have an antibiotic called in to [facility] for a bad cough and congestion.

## 2015-11-21 MED ORDER — MONTELUKAST SODIUM 5 MG PO CHEW: 5.0000 mg | CHEWABLE_TABLET | Freq: Every day | ORAL | Status: DC

## 2015-11-21 NOTE — Telephone Encounter (Signed)
Routing to provider  

## 2015-11-21 NOTE — Telephone Encounter (Signed)
My CMA may be gone for the day, so I'm following up on call I returned the call; she is at another location [phone] -------------------- I called group home, spoke with caregiver, sinuses are draining, no fever, no trouble breathing; sounds like drainage Start singulair, Rx to [facility] If getting worse, running fever, worsening sx, trouble breathing, etc, then call back or take her right to urgent care Staff member agrees

## 2015-11-21 NOTE — Telephone Encounter (Signed)
Routed to [place] by mistake, will route to correct provider now.

## 2015-11-21 NOTE — Telephone Encounter (Signed)
She will need to be seen at urgent care; we do not typically call in antibiotics for cough and congestion, as many illnesses are viral; she will need to be evaluated to determine what is right for her; please instruct them to take her to urgent care right away

## 2015-11-26 ENCOUNTER — Ambulatory Visit (INDEPENDENT_AMBULATORY_CARE_PROVIDER_SITE_OTHER): Payer: Medicare Other | Admitting: Family Medicine

## 2015-11-26 ENCOUNTER — Encounter: Payer: Self-pay | Admitting: Family Medicine

## 2015-11-26 VITALS — BP 150/77 | HR 63 | Temp 98.5°F | Wt 142.8 lb

## 2015-11-26 DIAGNOSIS — J309 Allergic rhinitis, unspecified: Secondary | ICD-10-CM | POA: Diagnosis not present

## 2015-11-26 DIAGNOSIS — D631 Anemia in chronic kidney disease: Secondary | ICD-10-CM

## 2015-11-26 DIAGNOSIS — G459 Transient cerebral ischemic attack, unspecified: Secondary | ICD-10-CM

## 2015-11-26 DIAGNOSIS — F0391 Unspecified dementia with behavioral disturbance: Secondary | ICD-10-CM | POA: Diagnosis not present

## 2015-11-26 DIAGNOSIS — R2681 Unsteadiness on feet: Secondary | ICD-10-CM | POA: Diagnosis not present

## 2015-11-26 DIAGNOSIS — N189 Chronic kidney disease, unspecified: Secondary | ICD-10-CM | POA: Diagnosis not present

## 2015-11-26 DIAGNOSIS — E785 Hyperlipidemia, unspecified: Secondary | ICD-10-CM | POA: Diagnosis not present

## 2015-11-26 DIAGNOSIS — Z9181 History of falling: Secondary | ICD-10-CM | POA: Diagnosis not present

## 2015-11-26 DIAGNOSIS — M6281 Muscle weakness (generalized): Secondary | ICD-10-CM | POA: Diagnosis not present

## 2015-11-26 DIAGNOSIS — E538 Deficiency of other specified B group vitamins: Secondary | ICD-10-CM

## 2015-11-26 DIAGNOSIS — F0390 Unspecified dementia without behavioral disturbance: Secondary | ICD-10-CM | POA: Diagnosis not present

## 2015-11-26 DIAGNOSIS — B0229 Other postherpetic nervous system involvement: Secondary | ICD-10-CM

## 2015-11-26 DIAGNOSIS — I10 Essential (primary) hypertension: Secondary | ICD-10-CM | POA: Diagnosis not present

## 2015-11-26 DIAGNOSIS — R600 Localized edema: Secondary | ICD-10-CM | POA: Diagnosis not present

## 2015-11-26 NOTE — Patient Instructions (Signed)
I'll see you for your next appointment at [facility] in about 4 months

## 2015-11-26 NOTE — Progress Notes (Signed)
BP 150/77 mmHg  Pulse 63  Temp(Src) 98.5 F (36.9 C)  Wt 142 lb 12.8 oz (64.774 kg)  SpO2 97%   Subjective:    Patient ID: Melissa Combs, female    DOB: Jan 17, 1930, 82 y.o.   MRN: [id number]  HPI: Melissa Combs is a 82 y.o. female  Chief Complaint  Patient presents with  . Cold    Needs signed order for Singulair.   Shari Heritage   Patient here with her group home caregiver/manager to have several forms completed; she needs FL-2; this was reviewed and filled out by MD Patient has been doing pretty well overall says caregiver She might be able to get her cataracts removed soon; they are working with guardian She has had a little sniffle lately, nothing serious; started singulair just recently; caregiver would like ears checked Doing well with BP medicine; no increased swelling in legs on higher dose of CCB; wearing compression stockings Patient has unsteady gait; needs PT; may need walker soon; using cane right now Seeing neurologist for dementia  Relevant past medical, surgical, family and social history reviewed and updated as indicated Past Medical History  Diagnosis Date  . Dementia   . Glaucoma   . Hypertension   . TIA (transient ischemic attack) Feb 2016  . Allergy   . Anemia   . Arthritis   . Impacted cerumen   . Chronic constipation   . Postherpetic neuralgia   . History of hand fracture     left  . History of wrist fracture     left  . History of fibrocystic disease of breast     biopsy done, negative  . History of shingles 2014  . B12 deficiency 11/06/2015    Less than 400 October 2016   Interim medical history since our last visit reviewed. Allergies and medications reviewed and updated.  Review of Systems  Per HPI unless specifically indicated above     Objective:    BP 150/77 mmHg  Pulse 63  Temp(Src) 98.5 F (36.9 C)  Wt 142 lb 12.8 oz (64.774 kg)  SpO2 97%  Wt Readings from Last 3 Encounters:  11/26/15 142 lb 12.8 oz (64.774 kg)   11/01/15 145 lb (65.772 kg)  09/27/15 145 lb (65.772 kg)    Physical Exam  Constitutional: She appears well-developed and well-nourished.  HENT:  Right Ear: Decreased hearing is noted.  Left Ear: Decreased hearing is noted.  Nose: Rhinorrhea (clear) present.  Mouth/Throat: Oropharynx is clear and moist and mucous membranes are normal.  Some cerumen in both canals  Cardiovascular: Normal rate and regular rhythm.   Pulmonary/Chest: Effort normal and breath sounds normal.  Neurological: She displays no tremor. Gait (unsteady gait, uses cane; genu valgus) abnormal.  Skin: She is not diaphoretic. No pallor.  Psychiatric:  Pleasantly confused, polite   Results for orders placed or performed in visit on [id number]  Basic metabolic panel  Result Value Ref Range   Glucose 84 65 - 99 mg/dL   BUN 13 8 - 27 mg/dL   Creatinine, Ser 0.85 0.57 - 1.00 mg/dL   GFR calc non Af Amer 64 >59 mL/min/1.73   GFR calc Af Amer 73 >59 mL/min/1.73   BUN/Creatinine Ratio 15 11 - 26   Sodium 141 134 - 144 mmol/L   Potassium 3.6 3.5 - 5.2 mmol/L   Chloride 95 (L) 96 - 106 mmol/L   CO2 30 (H) 18 - 29 mmol/L   Calcium 10.2 8.7 - 10.3 mg/dL  Assessment & Plan:   Problem List Items Addressed This Visit      Cardiovascular and Mediastinum   Hypertension - Primary    Fair control; continue meds; balancing CCB dose with leg edema; seems to be doing pretty well with current dose and with compression stockings      TIA (transient ischemic attack)    Continue low dose aspirin        Respiratory   Allergic rhinitis    Avoiding antihistamines because of dementia; using nasal corticosteroid and singulair        Digestive   B12 deficiency    Continue supplementation        Nervous and Auditory   Dementia    Avoiding anti-histamines for allergic rhinitis and using singulair instead because of her dementia and medicines to treat same; continue to follow-up with neurologist; current level of care  appropriate for her status; if decline noted in future, reconsider placement to SNF      Postherpetic neuralgia    Managed by pain clinic        Other   Anemia    Of chronic disease; stable      Dyslipidemia    She seems to be doing better with cessation of the statin in regards to dementia and behavior issues; continue current plan      Bilateral leg edema    Continue compression stockings; improved with reduction of CCB         Follow up plan: Return in about 4 months (around 03/27/2016) for follow-up at [facility].  An after-visit summary was printed and given to the patient at [facility].  Please see the patient instructions which may contain other information and recommendations beyond what is mentioned above in the assessment and plan.

## 2015-11-29 DIAGNOSIS — J309 Allergic rhinitis, unspecified: Secondary | ICD-10-CM | POA: Insufficient documentation

## 2015-11-29 NOTE — Assessment & Plan Note (Signed)
Avoiding anti-histamines for allergic rhinitis and using singulair instead because of her dementia and medicines to treat same; continue to follow-up with neurologist; current level of care appropriate for her status; if decline noted in future, reconsider placement to SNF

## 2015-11-29 NOTE — Assessment & Plan Note (Signed)
She seems to be doing better with cessation of the statin in regards to dementia and behavior issues; continue current plan

## 2015-11-29 NOTE — Assessment & Plan Note (Signed)
Continue supplementation  ?

## 2015-11-29 NOTE — Assessment & Plan Note (Signed)
Of chronic disease; stable

## 2015-11-29 NOTE — Assessment & Plan Note (Addendum)
Fair control; continue meds; balancing CCB dose with leg edema; seems to be doing pretty well with current dose and with compression stockings

## 2015-11-29 NOTE — Assessment & Plan Note (Signed)
Avoiding antihistamines because of dementia; using nasal corticosteroid and singulair

## 2015-11-29 NOTE — Assessment & Plan Note (Signed)
Continue compression stockings; improved with reduction of CCB

## 2015-11-29 NOTE — Assessment & Plan Note (Signed)
Continue low-dose aspirin 

## 2015-11-29 NOTE — Assessment & Plan Note (Signed)
Managed by pain clinic 

## 2015-12-02 DIAGNOSIS — Z9181 History of falling: Secondary | ICD-10-CM | POA: Diagnosis not present

## 2015-12-02 DIAGNOSIS — R2681 Unsteadiness on feet: Secondary | ICD-10-CM | POA: Diagnosis not present

## 2015-12-02 DIAGNOSIS — F0390 Unspecified dementia without behavioral disturbance: Secondary | ICD-10-CM | POA: Diagnosis not present

## 2015-12-02 DIAGNOSIS — I10 Essential (primary) hypertension: Secondary | ICD-10-CM | POA: Diagnosis not present

## 2015-12-02 DIAGNOSIS — M6281 Muscle weakness (generalized): Secondary | ICD-10-CM | POA: Diagnosis not present

## 2015-12-04 DIAGNOSIS — I10 Essential (primary) hypertension: Secondary | ICD-10-CM | POA: Diagnosis not present

## 2015-12-04 DIAGNOSIS — R2681 Unsteadiness on feet: Secondary | ICD-10-CM | POA: Diagnosis not present

## 2015-12-04 DIAGNOSIS — M6281 Muscle weakness (generalized): Secondary | ICD-10-CM | POA: Diagnosis not present

## 2015-12-04 DIAGNOSIS — F0390 Unspecified dementia without behavioral disturbance: Secondary | ICD-10-CM | POA: Diagnosis not present

## 2015-12-04 DIAGNOSIS — Z9181 History of falling: Secondary | ICD-10-CM | POA: Diagnosis not present

## 2015-12-09 DIAGNOSIS — I10 Essential (primary) hypertension: Secondary | ICD-10-CM | POA: Diagnosis not present

## 2015-12-09 DIAGNOSIS — R2681 Unsteadiness on feet: Secondary | ICD-10-CM | POA: Diagnosis not present

## 2015-12-09 DIAGNOSIS — M6281 Muscle weakness (generalized): Secondary | ICD-10-CM | POA: Diagnosis not present

## 2015-12-09 DIAGNOSIS — F0390 Unspecified dementia without behavioral disturbance: Secondary | ICD-10-CM | POA: Diagnosis not present

## 2015-12-09 DIAGNOSIS — Z9181 History of falling: Secondary | ICD-10-CM | POA: Diagnosis not present

## 2015-12-10 DIAGNOSIS — R2681 Unsteadiness on feet: Secondary | ICD-10-CM | POA: Diagnosis not present

## 2015-12-10 DIAGNOSIS — F0390 Unspecified dementia without behavioral disturbance: Secondary | ICD-10-CM | POA: Diagnosis not present

## 2015-12-10 DIAGNOSIS — I10 Essential (primary) hypertension: Secondary | ICD-10-CM | POA: Diagnosis not present

## 2015-12-10 DIAGNOSIS — Z9181 History of falling: Secondary | ICD-10-CM | POA: Diagnosis not present

## 2015-12-10 DIAGNOSIS — M6281 Muscle weakness (generalized): Secondary | ICD-10-CM | POA: Diagnosis not present

## 2015-12-13 ENCOUNTER — Encounter: Payer: Self-pay | Admitting: Family Medicine

## 2015-12-13 ENCOUNTER — Other Ambulatory Visit: Payer: Self-pay | Admitting: Family Medicine

## 2015-12-13 NOTE — Telephone Encounter (Signed)
March 2017 BMP reviewed; Rxs approved ---------------------- I received a note about dentures for patient I tried to reach out to patient's daughter, numbers from [place] section: [phone] --> wrong number, note sent to staff to remove # [phone] --> [facility] school program at a chapel [phone] --> detailed message left, no identifiers of pt or DOB, but if this is a family member of someone in [place], [place], please return my call

## 2015-12-17 DIAGNOSIS — M6281 Muscle weakness (generalized): Secondary | ICD-10-CM | POA: Diagnosis not present

## 2015-12-17 DIAGNOSIS — R2681 Unsteadiness on feet: Secondary | ICD-10-CM | POA: Diagnosis not present

## 2015-12-17 DIAGNOSIS — I10 Essential (primary) hypertension: Secondary | ICD-10-CM | POA: Diagnosis not present

## 2015-12-17 DIAGNOSIS — F0390 Unspecified dementia without behavioral disturbance: Secondary | ICD-10-CM | POA: Diagnosis not present

## 2015-12-17 DIAGNOSIS — Z9181 History of falling: Secondary | ICD-10-CM | POA: Diagnosis not present

## 2015-12-19 DIAGNOSIS — M6281 Muscle weakness (generalized): Secondary | ICD-10-CM | POA: Diagnosis not present

## 2015-12-19 DIAGNOSIS — F0390 Unspecified dementia without behavioral disturbance: Secondary | ICD-10-CM | POA: Diagnosis not present

## 2015-12-19 DIAGNOSIS — Z9181 History of falling: Secondary | ICD-10-CM | POA: Diagnosis not present

## 2015-12-19 DIAGNOSIS — R2681 Unsteadiness on feet: Secondary | ICD-10-CM | POA: Diagnosis not present

## 2015-12-19 DIAGNOSIS — I10 Essential (primary) hypertension: Secondary | ICD-10-CM | POA: Diagnosis not present

## 2015-12-23 ENCOUNTER — Other Ambulatory Visit: Payer: Self-pay | Admitting: Family Medicine

## 2015-12-23 DIAGNOSIS — F0390 Unspecified dementia without behavioral disturbance: Secondary | ICD-10-CM | POA: Diagnosis not present

## 2015-12-23 DIAGNOSIS — M6281 Muscle weakness (generalized): Secondary | ICD-10-CM | POA: Diagnosis not present

## 2015-12-23 DIAGNOSIS — I10 Essential (primary) hypertension: Secondary | ICD-10-CM | POA: Diagnosis not present

## 2015-12-23 DIAGNOSIS — R2681 Unsteadiness on feet: Secondary | ICD-10-CM | POA: Diagnosis not present

## 2015-12-23 DIAGNOSIS — Z9181 History of falling: Secondary | ICD-10-CM | POA: Diagnosis not present

## 2015-12-24 DIAGNOSIS — F0390 Unspecified dementia without behavioral disturbance: Secondary | ICD-10-CM | POA: Diagnosis not present

## 2015-12-24 DIAGNOSIS — Z9181 History of falling: Secondary | ICD-10-CM | POA: Diagnosis not present

## 2015-12-24 DIAGNOSIS — M6281 Muscle weakness (generalized): Secondary | ICD-10-CM | POA: Diagnosis not present

## 2015-12-24 DIAGNOSIS — R2681 Unsteadiness on feet: Secondary | ICD-10-CM | POA: Diagnosis not present

## 2015-12-24 DIAGNOSIS — I10 Essential (primary) hypertension: Secondary | ICD-10-CM | POA: Diagnosis not present

## 2015-12-26 DIAGNOSIS — I10 Essential (primary) hypertension: Secondary | ICD-10-CM | POA: Diagnosis not present

## 2015-12-26 DIAGNOSIS — F0390 Unspecified dementia without behavioral disturbance: Secondary | ICD-10-CM | POA: Diagnosis not present

## 2015-12-26 DIAGNOSIS — Z9181 History of falling: Secondary | ICD-10-CM | POA: Diagnosis not present

## 2015-12-26 DIAGNOSIS — R2681 Unsteadiness on feet: Secondary | ICD-10-CM | POA: Diagnosis not present

## 2015-12-26 DIAGNOSIS — M6281 Muscle weakness (generalized): Secondary | ICD-10-CM | POA: Diagnosis not present

## 2015-12-30 ENCOUNTER — Other Ambulatory Visit: Payer: Self-pay | Admitting: Family Medicine

## 2015-12-30 DIAGNOSIS — R2681 Unsteadiness on feet: Secondary | ICD-10-CM | POA: Diagnosis not present

## 2015-12-30 DIAGNOSIS — F0390 Unspecified dementia without behavioral disturbance: Secondary | ICD-10-CM | POA: Diagnosis not present

## 2015-12-30 DIAGNOSIS — Z9181 History of falling: Secondary | ICD-10-CM | POA: Diagnosis not present

## 2015-12-30 DIAGNOSIS — I10 Essential (primary) hypertension: Secondary | ICD-10-CM | POA: Diagnosis not present

## 2015-12-30 DIAGNOSIS — M6281 Muscle weakness (generalized): Secondary | ICD-10-CM | POA: Diagnosis not present

## 2015-12-30 MED ORDER — CALCIUM CARBONATE-VITAMIN D 600-400 MG-UNIT PO CHEW: 1.0000 | CHEWABLE_TABLET | Freq: Every day | ORAL | Status: DC

## 2015-12-31 ENCOUNTER — Other Ambulatory Visit: Payer: Self-pay | Admitting: Family Medicine

## 2016-01-01 DIAGNOSIS — M6281 Muscle weakness (generalized): Secondary | ICD-10-CM | POA: Diagnosis not present

## 2016-01-01 DIAGNOSIS — R2681 Unsteadiness on feet: Secondary | ICD-10-CM | POA: Diagnosis not present

## 2016-01-01 DIAGNOSIS — F0390 Unspecified dementia without behavioral disturbance: Secondary | ICD-10-CM | POA: Diagnosis not present

## 2016-01-01 DIAGNOSIS — Z9181 History of falling: Secondary | ICD-10-CM | POA: Diagnosis not present

## 2016-01-01 DIAGNOSIS — I10 Essential (primary) hypertension: Secondary | ICD-10-CM | POA: Diagnosis not present

## 2016-01-07 DIAGNOSIS — Z9181 History of falling: Secondary | ICD-10-CM | POA: Diagnosis not present

## 2016-01-07 DIAGNOSIS — M6281 Muscle weakness (generalized): Secondary | ICD-10-CM | POA: Diagnosis not present

## 2016-01-07 DIAGNOSIS — F0390 Unspecified dementia without behavioral disturbance: Secondary | ICD-10-CM | POA: Diagnosis not present

## 2016-01-07 DIAGNOSIS — R2681 Unsteadiness on feet: Secondary | ICD-10-CM | POA: Diagnosis not present

## 2016-01-07 DIAGNOSIS — I10 Essential (primary) hypertension: Secondary | ICD-10-CM | POA: Diagnosis not present

## 2016-01-09 DIAGNOSIS — I10 Essential (primary) hypertension: Secondary | ICD-10-CM | POA: Diagnosis not present

## 2016-01-09 DIAGNOSIS — R2681 Unsteadiness on feet: Secondary | ICD-10-CM | POA: Diagnosis not present

## 2016-01-09 DIAGNOSIS — M6281 Muscle weakness (generalized): Secondary | ICD-10-CM | POA: Diagnosis not present

## 2016-01-09 DIAGNOSIS — Z9181 History of falling: Secondary | ICD-10-CM | POA: Diagnosis not present

## 2016-01-09 DIAGNOSIS — F0390 Unspecified dementia without behavioral disturbance: Secondary | ICD-10-CM | POA: Diagnosis not present

## 2016-01-13 ENCOUNTER — Other Ambulatory Visit: Payer: Self-pay | Admitting: Family Medicine

## 2016-01-14 DIAGNOSIS — Z9181 History of falling: Secondary | ICD-10-CM | POA: Diagnosis not present

## 2016-01-14 DIAGNOSIS — M6281 Muscle weakness (generalized): Secondary | ICD-10-CM | POA: Diagnosis not present

## 2016-01-14 DIAGNOSIS — I10 Essential (primary) hypertension: Secondary | ICD-10-CM | POA: Diagnosis not present

## 2016-01-14 DIAGNOSIS — F0390 Unspecified dementia without behavioral disturbance: Secondary | ICD-10-CM | POA: Diagnosis not present

## 2016-01-14 DIAGNOSIS — R2681 Unsteadiness on feet: Secondary | ICD-10-CM | POA: Diagnosis not present

## 2016-01-16 DIAGNOSIS — Z9181 History of falling: Secondary | ICD-10-CM | POA: Diagnosis not present

## 2016-01-16 DIAGNOSIS — R2681 Unsteadiness on feet: Secondary | ICD-10-CM | POA: Diagnosis not present

## 2016-01-16 DIAGNOSIS — I10 Essential (primary) hypertension: Secondary | ICD-10-CM | POA: Diagnosis not present

## 2016-01-16 DIAGNOSIS — M6281 Muscle weakness (generalized): Secondary | ICD-10-CM | POA: Diagnosis not present

## 2016-01-16 DIAGNOSIS — F0390 Unspecified dementia without behavioral disturbance: Secondary | ICD-10-CM | POA: Diagnosis not present

## 2016-01-20 DIAGNOSIS — F0390 Unspecified dementia without behavioral disturbance: Secondary | ICD-10-CM | POA: Diagnosis not present

## 2016-01-20 DIAGNOSIS — I10 Essential (primary) hypertension: Secondary | ICD-10-CM | POA: Diagnosis not present

## 2016-01-20 DIAGNOSIS — R2681 Unsteadiness on feet: Secondary | ICD-10-CM | POA: Diagnosis not present

## 2016-01-20 DIAGNOSIS — M6281 Muscle weakness (generalized): Secondary | ICD-10-CM | POA: Diagnosis not present

## 2016-01-20 DIAGNOSIS — Z9181 History of falling: Secondary | ICD-10-CM | POA: Diagnosis not present

## 2016-01-22 DIAGNOSIS — M6281 Muscle weakness (generalized): Secondary | ICD-10-CM | POA: Diagnosis not present

## 2016-01-22 DIAGNOSIS — I10 Essential (primary) hypertension: Secondary | ICD-10-CM | POA: Diagnosis not present

## 2016-01-22 DIAGNOSIS — R2681 Unsteadiness on feet: Secondary | ICD-10-CM | POA: Diagnosis not present

## 2016-01-22 DIAGNOSIS — Z9181 History of falling: Secondary | ICD-10-CM | POA: Diagnosis not present

## 2016-01-22 DIAGNOSIS — F0390 Unspecified dementia without behavioral disturbance: Secondary | ICD-10-CM | POA: Diagnosis not present

## 2016-01-25 DIAGNOSIS — F0390 Unspecified dementia without behavioral disturbance: Secondary | ICD-10-CM | POA: Diagnosis not present

## 2016-01-25 DIAGNOSIS — I10 Essential (primary) hypertension: Secondary | ICD-10-CM | POA: Diagnosis not present

## 2016-01-25 DIAGNOSIS — R2681 Unsteadiness on feet: Secondary | ICD-10-CM | POA: Diagnosis not present

## 2016-01-25 DIAGNOSIS — M6281 Muscle weakness (generalized): Secondary | ICD-10-CM | POA: Diagnosis not present

## 2016-01-25 DIAGNOSIS — Z9181 History of falling: Secondary | ICD-10-CM | POA: Diagnosis not present

## 2016-01-27 ENCOUNTER — Other Ambulatory Visit: Payer: Self-pay | Admitting: Family Medicine

## 2016-01-28 DIAGNOSIS — I10 Essential (primary) hypertension: Secondary | ICD-10-CM | POA: Diagnosis not present

## 2016-01-28 DIAGNOSIS — F0390 Unspecified dementia without behavioral disturbance: Secondary | ICD-10-CM | POA: Diagnosis not present

## 2016-01-28 DIAGNOSIS — Z9181 History of falling: Secondary | ICD-10-CM | POA: Diagnosis not present

## 2016-01-28 DIAGNOSIS — M6281 Muscle weakness (generalized): Secondary | ICD-10-CM | POA: Diagnosis not present

## 2016-01-28 DIAGNOSIS — R2681 Unsteadiness on feet: Secondary | ICD-10-CM | POA: Diagnosis not present

## 2016-01-30 DIAGNOSIS — F0390 Unspecified dementia without behavioral disturbance: Secondary | ICD-10-CM | POA: Diagnosis not present

## 2016-01-30 DIAGNOSIS — Z9181 History of falling: Secondary | ICD-10-CM | POA: Diagnosis not present

## 2016-01-30 DIAGNOSIS — I10 Essential (primary) hypertension: Secondary | ICD-10-CM | POA: Diagnosis not present

## 2016-01-30 DIAGNOSIS — M6281 Muscle weakness (generalized): Secondary | ICD-10-CM | POA: Diagnosis not present

## 2016-01-30 DIAGNOSIS — R2681 Unsteadiness on feet: Secondary | ICD-10-CM | POA: Diagnosis not present

## 2016-02-03 ENCOUNTER — Telehealth: Payer: Self-pay

## 2016-02-03 DIAGNOSIS — I10 Essential (primary) hypertension: Secondary | ICD-10-CM | POA: Diagnosis not present

## 2016-02-03 DIAGNOSIS — F0390 Unspecified dementia without behavioral disturbance: Secondary | ICD-10-CM | POA: Diagnosis not present

## 2016-02-03 DIAGNOSIS — Z9181 History of falling: Secondary | ICD-10-CM | POA: Diagnosis not present

## 2016-02-03 DIAGNOSIS — M6281 Muscle weakness (generalized): Secondary | ICD-10-CM | POA: Diagnosis not present

## 2016-02-03 DIAGNOSIS — R2681 Unsteadiness on feet: Secondary | ICD-10-CM | POA: Diagnosis not present

## 2016-02-03 NOTE — Telephone Encounter (Signed)
Left voice mail

## 2016-02-03 NOTE — Telephone Encounter (Signed)
On her FL-2 form that you filled out on 11/26/15, you did not mark what kind of diet she needs to be on. They said she was previously on a limited sodium diet. Should she still be on that or a normal diet?

## 2016-02-03 NOTE — Telephone Encounter (Signed)
Low sodium diet please

## 2016-02-05 ENCOUNTER — Telehealth: Payer: Self-pay | Admitting: Family Medicine

## 2016-02-05 DIAGNOSIS — I10 Essential (primary) hypertension: Secondary | ICD-10-CM | POA: Diagnosis not present

## 2016-02-05 DIAGNOSIS — F0390 Unspecified dementia without behavioral disturbance: Secondary | ICD-10-CM | POA: Diagnosis not present

## 2016-02-05 DIAGNOSIS — R2681 Unsteadiness on feet: Secondary | ICD-10-CM | POA: Diagnosis not present

## 2016-02-05 DIAGNOSIS — Z9181 History of falling: Secondary | ICD-10-CM | POA: Diagnosis not present

## 2016-02-05 DIAGNOSIS — M6281 Muscle weakness (generalized): Secondary | ICD-10-CM | POA: Diagnosis not present

## 2016-02-05 NOTE — Telephone Encounter (Signed)
Yes, please see 02/03/16 phone note Low sodium diet, please Thank you

## 2016-02-05 NOTE — Telephone Encounter (Signed)
Pts care taker is asking if she needs to be on a sodium restrIcted diet or not. Says that the old order had her restricted , but the new one does not specify. The new order can be faxed to [facility] # [phone]

## 2016-02-05 NOTE — Telephone Encounter (Signed)
Done; faxed

## 2016-02-06 ENCOUNTER — Telehealth: Payer: Self-pay | Admitting: Family Medicine

## 2016-02-06 NOTE — Telephone Encounter (Signed)
Now they need new order that says no added salt

## 2016-02-06 NOTE — Telephone Encounter (Signed)
Please return Kita's call @ [phone] @ [facility] regarding patients salt intake.

## 2016-02-11 DIAGNOSIS — F0390 Unspecified dementia without behavioral disturbance: Secondary | ICD-10-CM | POA: Diagnosis not present

## 2016-02-11 DIAGNOSIS — I10 Essential (primary) hypertension: Secondary | ICD-10-CM | POA: Diagnosis not present

## 2016-02-11 DIAGNOSIS — Z9181 History of falling: Secondary | ICD-10-CM | POA: Diagnosis not present

## 2016-02-11 DIAGNOSIS — M6281 Muscle weakness (generalized): Secondary | ICD-10-CM | POA: Diagnosis not present

## 2016-02-11 DIAGNOSIS — R2681 Unsteadiness on feet: Secondary | ICD-10-CM | POA: Diagnosis not present

## 2016-02-13 DIAGNOSIS — R2681 Unsteadiness on feet: Secondary | ICD-10-CM | POA: Diagnosis not present

## 2016-02-13 DIAGNOSIS — I10 Essential (primary) hypertension: Secondary | ICD-10-CM | POA: Diagnosis not present

## 2016-02-13 DIAGNOSIS — F0390 Unspecified dementia without behavioral disturbance: Secondary | ICD-10-CM | POA: Diagnosis not present

## 2016-02-13 DIAGNOSIS — Z9181 History of falling: Secondary | ICD-10-CM | POA: Diagnosis not present

## 2016-02-13 DIAGNOSIS — M6281 Muscle weakness (generalized): Secondary | ICD-10-CM | POA: Diagnosis not present

## 2016-02-17 DIAGNOSIS — I10 Essential (primary) hypertension: Secondary | ICD-10-CM | POA: Diagnosis not present

## 2016-02-17 DIAGNOSIS — R2681 Unsteadiness on feet: Secondary | ICD-10-CM | POA: Diagnosis not present

## 2016-02-17 DIAGNOSIS — F0390 Unspecified dementia without behavioral disturbance: Secondary | ICD-10-CM | POA: Diagnosis not present

## 2016-02-17 DIAGNOSIS — M6281 Muscle weakness (generalized): Secondary | ICD-10-CM | POA: Diagnosis not present

## 2016-02-17 DIAGNOSIS — Z9181 History of falling: Secondary | ICD-10-CM | POA: Diagnosis not present

## 2016-02-18 ENCOUNTER — Other Ambulatory Visit: Payer: Self-pay | Admitting: Family Medicine

## 2016-02-20 DIAGNOSIS — F0390 Unspecified dementia without behavioral disturbance: Secondary | ICD-10-CM | POA: Diagnosis not present

## 2016-02-20 DIAGNOSIS — M6281 Muscle weakness (generalized): Secondary | ICD-10-CM | POA: Diagnosis not present

## 2016-02-20 DIAGNOSIS — R2681 Unsteadiness on feet: Secondary | ICD-10-CM | POA: Diagnosis not present

## 2016-02-20 DIAGNOSIS — I10 Essential (primary) hypertension: Secondary | ICD-10-CM | POA: Diagnosis not present

## 2016-02-20 DIAGNOSIS — Z9181 History of falling: Secondary | ICD-10-CM | POA: Diagnosis not present

## 2016-02-24 DIAGNOSIS — R2681 Unsteadiness on feet: Secondary | ICD-10-CM | POA: Diagnosis not present

## 2016-02-24 DIAGNOSIS — M6281 Muscle weakness (generalized): Secondary | ICD-10-CM | POA: Diagnosis not present

## 2016-02-24 DIAGNOSIS — F0390 Unspecified dementia without behavioral disturbance: Secondary | ICD-10-CM | POA: Diagnosis not present

## 2016-02-24 DIAGNOSIS — I10 Essential (primary) hypertension: Secondary | ICD-10-CM | POA: Diagnosis not present

## 2016-02-24 DIAGNOSIS — Z9181 History of falling: Secondary | ICD-10-CM | POA: Diagnosis not present

## 2016-02-26 DIAGNOSIS — F0390 Unspecified dementia without behavioral disturbance: Secondary | ICD-10-CM | POA: Diagnosis not present

## 2016-02-26 DIAGNOSIS — Z9181 History of falling: Secondary | ICD-10-CM | POA: Diagnosis not present

## 2016-02-26 DIAGNOSIS — R2681 Unsteadiness on feet: Secondary | ICD-10-CM | POA: Diagnosis not present

## 2016-02-26 DIAGNOSIS — I10 Essential (primary) hypertension: Secondary | ICD-10-CM | POA: Diagnosis not present

## 2016-02-26 DIAGNOSIS — M6281 Muscle weakness (generalized): Secondary | ICD-10-CM | POA: Diagnosis not present

## 2016-03-01 ENCOUNTER — Emergency Department: Payer: Medicare Other

## 2016-03-01 ENCOUNTER — Emergency Department
Admission: EM | Admit: 2016-03-01 | Discharge: 2016-03-01 | Disposition: A | Discharge status: Home / Self Care | Payer: Medicare Other | Attending: Emergency Medicine | Admitting: Emergency Medicine

## 2016-03-01 ENCOUNTER — Encounter: Payer: Self-pay | Admitting: Emergency Medicine

## 2016-03-01 DIAGNOSIS — M25561 Pain in right knee: Secondary | ICD-10-CM | POA: Diagnosis present

## 2016-03-01 DIAGNOSIS — Z8673 Personal history of transient ischemic attack (TIA), and cerebral infarction without residual deficits: Secondary | ICD-10-CM | POA: Diagnosis not present

## 2016-03-01 DIAGNOSIS — Z7982 Long term (current) use of aspirin: Secondary | ICD-10-CM | POA: Diagnosis not present

## 2016-03-01 DIAGNOSIS — M5136 Other intervertebral disc degeneration, lumbar region: Secondary | ICD-10-CM | POA: Insufficient documentation

## 2016-03-01 DIAGNOSIS — N183 Chronic kidney disease, stage 3 (moderate): Secondary | ICD-10-CM | POA: Diagnosis not present

## 2016-03-01 DIAGNOSIS — Z79899 Other long term (current) drug therapy: Secondary | ICD-10-CM | POA: Insufficient documentation

## 2016-03-01 DIAGNOSIS — M25461 Effusion, right knee: Secondary | ICD-10-CM | POA: Diagnosis not present

## 2016-03-01 DIAGNOSIS — E785 Hyperlipidemia, unspecified: Secondary | ICD-10-CM | POA: Insufficient documentation

## 2016-03-01 DIAGNOSIS — M1710 Unilateral primary osteoarthritis, unspecified knee: Secondary | ICD-10-CM | POA: Insufficient documentation

## 2016-03-01 DIAGNOSIS — Z7951 Long term (current) use of inhaled steroids: Secondary | ICD-10-CM | POA: Insufficient documentation

## 2016-03-01 DIAGNOSIS — I129 Hypertensive chronic kidney disease with stage 1 through stage 4 chronic kidney disease, or unspecified chronic kidney disease: Secondary | ICD-10-CM | POA: Insufficient documentation

## 2016-03-01 DIAGNOSIS — M79651 Pain in right thigh: Secondary | ICD-10-CM | POA: Diagnosis not present

## 2016-03-01 MED ORDER — ACETAMINOPHEN 325 MG PO TABS: 650.0000 mg | ORAL_TABLET | ORAL | Status: DC | PRN

## 2016-03-01 MED ORDER — TRAMADOL HCL 50 MG PO TABS
50.0000 mg | ORAL_TABLET | Freq: Once | ORAL | Status: AC
  Administered 2016-03-01: 50 mg via ORAL
  Filled 2016-03-01: qty 1

## 2016-03-01 MED ORDER — TRAMADOL HCL 50 MG PO TABS: 50.0000 mg | ORAL_TABLET | Freq: Four times a day (QID) | ORAL | Status: DC | PRN

## 2016-03-01 MED ORDER — ACETAMINOPHEN 325 MG PO TABS: 650.0000 mg | ORAL_TABLET | Freq: Once | ORAL | Status: DC

## 2016-03-01 NOTE — Discharge Instructions (Signed)
Knee Effusion °Knee effusion means that you have extra fluid in your knee. This can cause pain. Your knee may be more difficult to bend and move. °HOME CARE °· Use crutches as told by your doctor. °· Wear a knee brace as told by your doctor. °· Apply ice to the swollen area: °¨ Put ice in a plastic bag. °¨ Place a towel between your skin and the bag. °¨ Leave the ice on for 20 minutes, 2-3 times per day. °· Keep your knee raised (elevated) when you are sitting or lying down. °· Take medicines only as told by your doctor. °· Do any rehabilitation or strengthening exercises as told by your doctor. °· Rest your knee as told by your doctor. You may start doing your normal activities again when your doctor says it is okay. °· Keep all follow-up visits as told by your doctor. This is important. °GET HELP IF:  °· You continue to have pain in your knee. °GET HELP RIGHT AWAY IF: °· You have increased swelling or redness of your knee. °· You have severe pain in your knee. °· You have a fever. °  °This information is not intended to replace advice given to you by your health care provider. Make sure you discuss any questions you have with your health care provider. °  °Document Released: 09/19/2010 Document Revised: 09/07/2014 Document Reviewed: 04/02/2014 °Elsevier Interactive Patient Education ©2016 Elsevier Inc. ° °

## 2016-03-01 NOTE — ED Notes (Signed)
Ace wrap applied by Beverlee Nims.

## 2016-03-01 NOTE — ED Notes (Signed)
Patient to ER for c/o right leg pain. Patient has h/o dementia. Difficult to determine if patient's knee or hip is main problem, but patient states "It hurts all the way around". Patient hypertensive, has h/o the same. Denies any injury.

## 2016-03-01 NOTE — ED Provider Notes (Signed)
[facility] Emergency Department Provider Note ____________________________________________  Time seen: Approximately 3:20 PM  I have reviewed the triage vital signs and the nursing notes.   HISTORY  Chief Complaint Knee Pain and Hip Pain    HPI Melissa Combs is a 82 y.o. female who presents to the emergency department for evaluation of right knee pain. No known injury. History of joint effusions, but none in the past several months. She began to complain of hip or knee pain last night and it has been progressive today. She lives in assisted living facility and caregivers report that her physician discontinued her as needed tylenol order because he "didn't want her to take anymore."   Past Medical History  Diagnosis Date  . Dementia   . Glaucoma   . Hypertension   . TIA (transient ischemic attack) Feb 2016  . Allergy   . Anemia   . Arthritis   . Impacted cerumen   . Chronic constipation   . Postherpetic neuralgia   . History of hand fracture     left  . History of wrist fracture     left  . History of fibrocystic disease of breast     biopsy done, negative  . History of shingles 2014  . B12 deficiency 11/06/2015    Less than 400 October 2016    Patient Active Problem List   Diagnosis Date Noted  . Allergic rhinitis 11/29/2015  . B12 deficiency 11/06/2015  . Osteoporosis screening 11/01/2015  . Hypochloremia 09/29/2015  . Hyponatremia 09/29/2015  . Medication monitoring encounter 09/27/2015  . Bilateral leg edema 09/27/2015  . Dyslipidemia 06/27/2015  . CKD (chronic kidney disease) stage 3, GFR 30-59 ml/min 03/28/2015  . Glaucoma   . Hypertension   . Dementia   . Anemia   . Arthritis   . Chronic constipation   . Postherpetic neuralgia   . Post herpetic neuralgia head 01/13/2015  . Bilateral occipital neuralgia 01/13/2015  . DDD (degenerative disc disease), lumbar 01/13/2015  . DJD (degenerative joint disease) of knee 01/13/2015  .  TIA (transient ischemic attack) 10/01/2014    Past Surgical History  Procedure Laterality Date  . No past surgeries      Current Outpatient Rx  Name  Route  Sig  Dispense  Refill  . acetaminophen (TYLENOL) 325 MG tablet   Oral   Take 2 tablets (650 mg total) by mouth every 4 (four) hours as needed.   30 tablet   0   . amLODipine (NORVASC) 2.5 MG tablet   Oral   Take 1.5 tablets (3.75 mg total) by mouth daily.   45 tablet   11     Cancel the other refills, cancel 2.5 mg one a day  ...   . aspirin 81 MG chewable tablet      TAKE 1 TABLET BY MOUTH ONCE DAILY.   30 tablet   5   . bismuth subsalicylate (PEPTO BISMOL) 262 MG/15ML suspension   Oral   Take 30 mLs by mouth every 6 (six) hours as needed.         . Calcium Carbonate-Vitamin D 600-400 MG-UNIT chew tablet   Oral   Chew 1 tablet by mouth daily.   30 tablet   5   . carvedilol (COREG) 6.25 MG tablet      TAKE ONE TABLET BY MOUTH TWICE DAILY FOR BLOOD PRESSURE.   60 tablet   11   . citalopram (CELEXA) 10 MG tablet  TAKE ONE TABLET BY MOUTH ONCE DAILY FOR DEPRESSION   30 tablet   5   . divalproex (DEPAKOTE SPRINKLE) 125 MG capsule   Oral   Take 125 mg by mouth 2 (two) times daily.         Marland Kitchen docusate sodium (COLACE) 100 MG capsule      TAKE ONE CAPSULE BY MOUTH ONCE DAILY AT BEDTIME FOR STOOL SOFTENER   30 capsule   11   . donepezil (ARICEPT) 10 MG tablet   Oral   Take 10 mg by mouth at bedtime.         . fluticasone (FLONASE) 50 MCG/ACT nasal spray      USE 2 SPRAYS IN BOTH NOSTRILS ONCE DAILYFOR RHINITIS   16 g   11     WE NEED A CONTINUING ORDER OR A DISCONTINUE ORDER. ...   . gabapentin (NEURONTIN) 300 MG capsule      TAKE 1 CAPSULE BY MOUTH TWICE DAILY   60 capsule   6   . GNP VITAMIN B-12 500 MCG tablet      TAKE 1 TABLET BY MOUTH ONCE DAILY FOR SUPPLEMENT   30 tablet   6   . latanoprost (XALATAN) 0.005 % ophthalmic solution   Both Eyes   Place 1 drop into both eyes  at bedtime.         Marland Kitchen lisinopril (PRINIVIL,ZESTRIL) 20 MG tablet      TAKE 1 TABLET BY MOUTH ONCE DAILY   30 tablet   2   . montelukast (SINGULAIR) 5 MG chewable tablet      CHEW 1 TABLET BY MOUTH AT BEDTIME.   30 tablet   11   . NAMENDA XR 28 MG CP24 24 hr capsule      TAKE 1 CAPSULE BY MOUTH ONCE DAILY FOR MEMORY   30 capsule   5   . polyethylene glycol (MIRALAX / GLYCOLAX) packet   Oral   Take 17 g by mouth daily. prn         . timolol (BETIMOL) 0.25 % ophthalmic solution   Both Eyes   Place 1 drop into both eyes daily.         . traMADol (ULTRAM) 50 MG tablet   Oral   Take 1 tablet (50 mg total) by mouth every 6 (six) hours as needed.   9 tablet   0   . VOLTAREN 1 % GEL      APPLY 2 GRAMS TO EACH KNEE EVERY 8 HOURSAS NEEDED FOR PAIN AND INFLAMMATION   100 g   1     Allergies Review of patient's allergies indicates no known allergies.  Family History  Problem Relation Age of Onset  . Family history unknown: Yes    Social History Social History  Substance Use Topics  . Smoking status: Never Smoker   . Smokeless tobacco: Never Used  . Alcohol Use: No    Review of Systems Constitutional: No recent illness. Cardiovascular: Denies chest pain or palpitations. Respiratory: Denies shortness of breath. Musculoskeletal: Pain in right knee. Skin: Negative for rash, wound, lesion. Neurological: Negative for focal weakness or numbness.  ____________________________________________   PHYSICAL EXAM:  VITAL SIGNS: ED Triage Vitals  Enc Vitals Group     BP 03/01/16 1434 187/61 mmHg     Pulse Rate 03/01/16 1434 66     Resp 03/01/16 1434 20     Temp 03/01/16 1434 98.6 F (37 C)     Temp Source 03/01/16  1434 Oral     SpO2 03/01/16 1434 96 %     Weight 03/01/16 1434 144 lb (65.318 kg)     Height 03/01/16 1434 5\' 4"  (1.626 m)     Head Cir --      Peak Flow --      Pain Score --      Pain Loc --      Pain Edu? --      Excl. in [place]? --      Constitutional: Alert and oriented. Well appearing and in no acute distress. Eyes: Conjunctivae are normal. EOMI. Head: Atraumatic. Neck: No stridor.  Respiratory: Normal respiratory effort.   Musculoskeletal: No pain with passive ROM of right hip. Pain on gentle flexion of the right knee. Knee is noted to be swollen without warmth or erythema. Neurologic:  Normal speech and language. No gross focal neurologic deficits are appreciated. Speech is normal. No gait instability. Skin:  Skin is warm, dry and intact. Atraumatic. Psychiatric: Mood and affect are normal. Speech and behavior are normal.  ____________________________________________   LABS (all labs ordered are listed, but only abnormal results are displayed)  Labs Reviewed - No data to display ____________________________________________  RADIOLOGY  Moderate tricompartmental osteoarthritis and small knee joint effusion. No evidence of fracture per radiology. ____________________________________________   PROCEDURES  Procedure(s) performed:  Jones wrap to the right knee.   ____________________________________________   INITIAL IMPRESSION / ASSESSMENT AND PLAN / ED COURSE  Pertinent labs & imaging results that were available during my care of the patient were reviewed by me and considered in my medical decision making (see chart for details).  Patient to follow up with her primary care provider on Monday. Caregivers were warned that Tramadol may make her dizzy and were advised to assist her at all times while out of bed if taking it. They were advised to discuss transitioning to tylenol as soon as possible. They were advised to follow up with orthopedics next week. They were also advised to return to the ER for symptoms that change or worsen. ____________________________________________   FINAL CLINICAL IMPRESSION(S) / ED DIAGNOSES  Final diagnoses:  Knee effusion, right       Victorino Dike,  FNP 03/01/16 1803  Delman Kitten, MD 03/01/16 [phone]

## 2016-03-02 ENCOUNTER — Telehealth: Payer: Self-pay | Admitting: Family Medicine

## 2016-03-02 DIAGNOSIS — I10 Essential (primary) hypertension: Secondary | ICD-10-CM | POA: Diagnosis not present

## 2016-03-02 DIAGNOSIS — F0390 Unspecified dementia without behavioral disturbance: Secondary | ICD-10-CM | POA: Diagnosis not present

## 2016-03-02 DIAGNOSIS — M6281 Muscle weakness (generalized): Secondary | ICD-10-CM | POA: Diagnosis not present

## 2016-03-02 DIAGNOSIS — R2681 Unsteadiness on feet: Secondary | ICD-10-CM | POA: Diagnosis not present

## 2016-03-02 DIAGNOSIS — Z9181 History of falling: Secondary | ICD-10-CM | POA: Diagnosis not present

## 2016-03-02 MED ORDER — TRAMADOL HCL 50 MG PO TABS: 50.0000 mg | ORAL_TABLET | Freq: Four times a day (QID) | ORAL | Status: DC | PRN

## 2016-03-02 NOTE — Telephone Encounter (Signed)
Meds ordered this encounter  Medications  . acetaminophen (TYLENOL) 325 MG tablet    Sig: Take 2 tablets (650 mg total) by mouth every 4 (four) hours as needed. Maximum of 3,000 mg per 24 hours    Refill:  0  . traMADol (ULTRAM) 50 MG tablet    Sig: Take 1 tablet (50 mg total) by mouth every 6 (six) hours as needed for moderate pain.    Dispense:  30 tablet    Refill:  0    Yes that's fine Fax this over as new orders, along with prescription to pharmacy

## 2016-03-02 NOTE — Telephone Encounter (Signed)
Melissa Combs from [facility] called states pt went to ER and she was given Ultram and they need to be sure that this medication is ok for her take. Please advise .

## 2016-03-04 ENCOUNTER — Other Ambulatory Visit: Payer: Self-pay | Admitting: Family Medicine

## 2016-03-04 MED ORDER — BENZONATATE 100 MG PO CAPS: 100.0000 mg | ORAL_CAPSULE | Freq: Three times a day (TID) | ORAL | Status: AC | PRN

## 2016-03-04 NOTE — Telephone Encounter (Signed)
Rx sent for cough medicine; if she develops fever, worsening cough, shortness of breath, etc then have her go to urgent care or ER

## 2016-03-04 NOTE — Telephone Encounter (Signed)
Melissa Combs the [facility] patient has a cough that she noticed on Monday when she came back to work. It is a congested cough. Please send to [facility] #: [phone]

## 2016-03-05 DIAGNOSIS — I10 Essential (primary) hypertension: Secondary | ICD-10-CM | POA: Diagnosis not present

## 2016-03-05 DIAGNOSIS — M6281 Muscle weakness (generalized): Secondary | ICD-10-CM | POA: Diagnosis not present

## 2016-03-05 DIAGNOSIS — Z9181 History of falling: Secondary | ICD-10-CM | POA: Diagnosis not present

## 2016-03-05 DIAGNOSIS — R2681 Unsteadiness on feet: Secondary | ICD-10-CM | POA: Diagnosis not present

## 2016-03-05 DIAGNOSIS — F0390 Unspecified dementia without behavioral disturbance: Secondary | ICD-10-CM | POA: Diagnosis not present

## 2016-03-05 NOTE — Telephone Encounter (Signed)
Spoke to [place]. Was notified about Melissa Combs medicine at pharmacy

## 2016-03-05 NOTE — Telephone Encounter (Signed)
Okay to take Tylenol as written and okay to take tramadol as written Thank you, Dr. Sanda Klein   Current outpatient prescriptions:  .  acetaminophen (TYLENOL) 325 MG tablet, Take 2 tablets (650 mg total) by mouth every 4 (four) hours as needed. Maximum of 3,000 mg per 24 hours, Disp: , Rfl: 0 .  amLODipine (NORVASC) 2.5 MG tablet, Take 1.5 tablets (3.75 mg total) by mouth daily., Disp: 45 tablet, Rfl: 11 .  aspirin 81 MG chewable tablet, TAKE 1 TABLET BY MOUTH ONCE DAILY., Disp: 30 tablet, Rfl: 5 .  benzonatate (TESSALON PERLES) 100 MG capsule, Take 1 capsule (100 mg total) by mouth every 8 (eight) hours as needed for cough. For 10 days, then STOP, Disp: 30 capsule, Rfl: 0 .  bismuth subsalicylate (PEPTO BISMOL) 262 MG/15ML suspension, Take 30 mLs by mouth every 6 (six) hours as needed., Disp: , Rfl:  .  Calcium Carbonate-Vitamin D 600-400 MG-UNIT chew tablet, Chew 1 tablet by mouth daily., Disp: 30 tablet, Rfl: 5 .  carvedilol (COREG) 6.25 MG tablet, TAKE ONE TABLET BY MOUTH TWICE DAILY FOR BLOOD PRESSURE., Disp: 60 tablet, Rfl: 11 .  citalopram (CELEXA) 10 MG tablet, TAKE ONE TABLET BY MOUTH ONCE DAILY FOR DEPRESSION, Disp: 30 tablet, Rfl: 5 .  divalproex (DEPAKOTE SPRINKLE) 125 MG capsule, Take 125 mg by mouth 2 (two) times daily., Disp: , Rfl:  .  docusate sodium (COLACE) 100 MG capsule, TAKE ONE CAPSULE BY MOUTH ONCE DAILY AT BEDTIME FOR STOOL SOFTENER, Disp: 30 capsule, Rfl: 11 .  donepezil (ARICEPT) 10 MG tablet, Take 10 mg by mouth at bedtime., Disp: , Rfl:  .  fluticasone (FLONASE) 50 MCG/ACT nasal spray, USE 2 SPRAYS IN BOTH NOSTRILS ONCE DAILYFOR RHINITIS, Disp: 16 g, Rfl: 11 .  gabapentin (NEURONTIN) 300 MG capsule, TAKE 1 CAPSULE BY MOUTH TWICE DAILY, Disp: 60 capsule, Rfl: 6 .  GNP VITAMIN B-12 500 MCG tablet, TAKE 1 TABLET BY MOUTH ONCE DAILY FOR SUPPLEMENT, Disp: 30 tablet, Rfl: 6 .  latanoprost (XALATAN) 0.005 % ophthalmic solution, Place 1 drop into both eyes at bedtime., Disp: , Rfl:   .  lisinopril (PRINIVIL,ZESTRIL) 20 MG tablet, TAKE 1 TABLET BY MOUTH ONCE DAILY, Disp: 30 tablet, Rfl: 2 .  montelukast (SINGULAIR) 5 MG chewable tablet, CHEW 1 TABLET BY MOUTH AT BEDTIME., Disp: 30 tablet, Rfl: 11 .  NAMENDA XR 28 MG CP24 24 hr capsule, TAKE 1 CAPSULE BY MOUTH ONCE DAILY FOR MEMORY, Disp: 30 capsule, Rfl: 5 .  polyethylene glycol (MIRALAX / GLYCOLAX) packet, Take 17 g by mouth daily. prn, Disp: , Rfl:  .  timolol (BETIMOL) 0.25 % ophthalmic solution, Place 1 drop into both eyes daily., Disp: , Rfl:  .  traMADol (ULTRAM) 50 MG tablet, Take 1 tablet (50 mg total) by mouth every 6 (six) hours as needed for moderate pain., Disp: 30 tablet, Rfl: 0 .  VOLTAREN 1 % GEL, APPLY 2 GRAMS TO EACH KNEE EVERY 8 HOURSAS NEEDED FOR PAIN AND INFLAMMATION, Disp: 100 g, Rfl: 1

## 2016-03-05 NOTE — Telephone Encounter (Signed)
Spoke to Melissa Combs. Need order stating she can continue to take Tylenol and Tramadol faxed.

## 2016-03-09 DIAGNOSIS — Z9181 History of falling: Secondary | ICD-10-CM | POA: Diagnosis not present

## 2016-03-09 DIAGNOSIS — M6281 Muscle weakness (generalized): Secondary | ICD-10-CM | POA: Diagnosis not present

## 2016-03-09 DIAGNOSIS — F0390 Unspecified dementia without behavioral disturbance: Secondary | ICD-10-CM | POA: Diagnosis not present

## 2016-03-09 DIAGNOSIS — I10 Essential (primary) hypertension: Secondary | ICD-10-CM | POA: Diagnosis not present

## 2016-03-09 DIAGNOSIS — R2681 Unsteadiness on feet: Secondary | ICD-10-CM | POA: Diagnosis not present

## 2016-03-11 DIAGNOSIS — F0390 Unspecified dementia without behavioral disturbance: Secondary | ICD-10-CM | POA: Diagnosis not present

## 2016-03-11 DIAGNOSIS — M6281 Muscle weakness (generalized): Secondary | ICD-10-CM | POA: Diagnosis not present

## 2016-03-11 DIAGNOSIS — I10 Essential (primary) hypertension: Secondary | ICD-10-CM | POA: Diagnosis not present

## 2016-03-11 DIAGNOSIS — Z9181 History of falling: Secondary | ICD-10-CM | POA: Diagnosis not present

## 2016-03-11 DIAGNOSIS — R2681 Unsteadiness on feet: Secondary | ICD-10-CM | POA: Diagnosis not present

## 2016-03-12 ENCOUNTER — Telehealth: Payer: Self-pay | Admitting: Family Medicine

## 2016-03-12 MED ORDER — DICLOFENAC SODIUM 1 % TD GEL: TRANSDERMAL | Status: DC

## 2016-03-12 MED ORDER — TRAMADOL HCL 50 MG PO TABS: 50.0000 mg | ORAL_TABLET | Freq: Two times a day (BID) | ORAL | Status: DC

## 2016-03-12 NOTE — Telephone Encounter (Signed)
That is fine Fax this note along with prescription please  CANCEL previous orders for tramadol and voltaren (diclofenac) gel  Meds ordered this encounter  Medications  . diclofenac sodium (VOLTAREN) 1 % GEL    Sig: Apply two grams over each knee every six hours WHILE AWAKE scheduled (for example, at 0800 hours, 1400 hours, and 2000 hours)    Dispense:  100 g    Refill:  5  . traMADol (ULTRAM) 50 MG tablet    Sig: Take 1 tablet (50 mg total) by mouth 2 (two) times daily. Scheduled, at 0800 hours and 2000 hours    Dispense:  60 tablet    Refill:  0   Fax Rxs to pharmacy too please

## 2016-03-12 NOTE — Telephone Encounter (Signed)
Natividad Brood (caregiver) states patient is needing a written order for Tramadol. Also needing order for Voltaren Gel, right now it is PRN and due to her having a lot of trouble with her knees they are wanting it scheduled. [phone]

## 2016-03-16 DIAGNOSIS — Z9181 History of falling: Secondary | ICD-10-CM | POA: Diagnosis not present

## 2016-03-16 DIAGNOSIS — R2681 Unsteadiness on feet: Secondary | ICD-10-CM | POA: Diagnosis not present

## 2016-03-16 DIAGNOSIS — I10 Essential (primary) hypertension: Secondary | ICD-10-CM | POA: Diagnosis not present

## 2016-03-16 DIAGNOSIS — M6281 Muscle weakness (generalized): Secondary | ICD-10-CM | POA: Diagnosis not present

## 2016-03-16 DIAGNOSIS — F0390 Unspecified dementia without behavioral disturbance: Secondary | ICD-10-CM | POA: Diagnosis not present

## 2016-03-17 ENCOUNTER — Other Ambulatory Visit: Payer: Self-pay | Admitting: Family Medicine

## 2016-03-17 NOTE — Telephone Encounter (Signed)
March 2017 creatinine and K+ reviewed; Rx approved

## 2016-03-18 ENCOUNTER — Telehealth: Payer: Self-pay | Admitting: Family Medicine

## 2016-03-18 DIAGNOSIS — Z9181 History of falling: Secondary | ICD-10-CM | POA: Diagnosis not present

## 2016-03-18 DIAGNOSIS — M6281 Muscle weakness (generalized): Secondary | ICD-10-CM | POA: Diagnosis not present

## 2016-03-18 DIAGNOSIS — R2681 Unsteadiness on feet: Secondary | ICD-10-CM | POA: Diagnosis not present

## 2016-03-18 DIAGNOSIS — I10 Essential (primary) hypertension: Secondary | ICD-10-CM | POA: Diagnosis not present

## 2016-03-18 DIAGNOSIS — F0390 Unspecified dementia without behavioral disturbance: Secondary | ICD-10-CM | POA: Diagnosis not present

## 2016-03-18 NOTE — Telephone Encounter (Signed)
I spoke with staff member about voltaren gel; they called; did not get the note about the scheduled dosing; they still have it as PRN I'll ask staff to refax the Rx to pharmacy and to them Group home [phone] fax

## 2016-03-19 NOTE — Telephone Encounter (Signed)
It is three times a day as it's written; it's semantics, but three times a day is technically at 0800, 1400, and midnight; I did not want them to put it on her at night, so it is written for every six hours WHILE AWAKE which is 0800, 1400, and 2000 hours which is three times during the day

## 2016-03-19 NOTE — Telephone Encounter (Signed)
Received a call from [facility] and they stated that Voltaren gel should be 3x per day.  Please fax rx to [phone]

## 2016-03-20 NOTE — Telephone Encounter (Signed)
Done faxed written order

## 2016-03-24 ENCOUNTER — Telehealth: Payer: Self-pay | Admitting: Family Medicine

## 2016-03-24 NOTE — Telephone Encounter (Signed)
Cancel current tramadol orders Start tramadol 50 mg one by mouth every twelve hours PRN Enid Derry, MD 03/24/2016

## 2016-03-24 NOTE — Telephone Encounter (Signed)
Order written and faxed.

## 2016-04-14 ENCOUNTER — Other Ambulatory Visit: Payer: Self-pay | Admitting: Family Medicine

## 2016-04-24 ENCOUNTER — Other Ambulatory Visit: Payer: Self-pay | Admitting: Family Medicine

## 2016-05-12 ENCOUNTER — Other Ambulatory Visit: Payer: Self-pay | Admitting: Family Medicine

## 2016-05-13 ENCOUNTER — Other Ambulatory Visit: Payer: Self-pay | Admitting: Family Medicine

## 2016-05-13 MED ORDER — LISINOPRIL 20 MG PO TABS: 20.0000 mg | ORAL_TABLET | Freq: Every day | ORAL | 2 refills | Status: DC

## 2016-05-18 DIAGNOSIS — F0390 Unspecified dementia without behavioral disturbance: Secondary | ICD-10-CM | POA: Diagnosis not present

## 2016-05-18 DIAGNOSIS — G459 Transient cerebral ischemic attack, unspecified: Secondary | ICD-10-CM | POA: Diagnosis not present

## 2016-06-08 ENCOUNTER — Other Ambulatory Visit: Payer: Self-pay | Admitting: Family Medicine

## 2016-06-23 ENCOUNTER — Other Ambulatory Visit: Payer: Self-pay | Admitting: Family Medicine

## 2016-06-24 MED ORDER — CALCIUM CARBONATE-VITAMIN D 600-400 MG-UNIT PO CHEW: 1.0000 | CHEWABLE_TABLET | Freq: Every day | ORAL | 11 refills | Status: DC

## 2016-06-26 DIAGNOSIS — Z23 Encounter for immunization: Secondary | ICD-10-CM | POA: Diagnosis not present

## 2016-07-06 ENCOUNTER — Other Ambulatory Visit: Payer: Self-pay | Admitting: Family Medicine

## 2016-07-07 NOTE — Telephone Encounter (Signed)
rx approved

## 2016-07-16 ENCOUNTER — Other Ambulatory Visit: Payer: Self-pay | Admitting: Family Medicine

## 2016-07-20 ENCOUNTER — Other Ambulatory Visit: Payer: Self-pay | Admitting: Family Medicine

## 2016-07-22 DIAGNOSIS — H04123 Dry eye syndrome of bilateral lacrimal glands: Secondary | ICD-10-CM | POA: Diagnosis not present

## 2016-07-22 DIAGNOSIS — H2513 Age-related nuclear cataract, bilateral: Secondary | ICD-10-CM | POA: Diagnosis not present

## 2016-07-22 DIAGNOSIS — H401132 Primary open-angle glaucoma, bilateral, moderate stage: Secondary | ICD-10-CM | POA: Diagnosis not present

## 2016-08-08 ENCOUNTER — Telehealth: Payer: Self-pay | Admitting: Family Medicine

## 2016-08-08 NOTE — Telephone Encounter (Signed)
Please ask staff at patient's group home to schedule a follow-up appt for Melissa Combs. I've not seen her since March; we'll get fasting labs then too, please Thank you Refills sent as requested

## 2016-08-10 NOTE — Telephone Encounter (Signed)
Appointment made for 09/03/15

## 2016-08-25 ENCOUNTER — Ambulatory Visit: Payer: Medicare Other | Admitting: Family Medicine

## 2016-08-26 DIAGNOSIS — H2513 Age-related nuclear cataract, bilateral: Secondary | ICD-10-CM | POA: Diagnosis not present

## 2016-09-01 ENCOUNTER — Other Ambulatory Visit: Payer: Self-pay | Admitting: Family Medicine

## 2016-09-01 NOTE — Telephone Encounter (Signed)
Glitch in computer system; I was not getting refill requests; addressing today

## 2016-09-02 ENCOUNTER — Encounter: Payer: Self-pay | Admitting: Family Medicine

## 2016-09-02 ENCOUNTER — Ambulatory Visit (INDEPENDENT_AMBULATORY_CARE_PROVIDER_SITE_OTHER): Payer: Medicare Other | Admitting: Family Medicine

## 2016-09-02 VITALS — BP 122/62 | HR 61 | Temp 98.1°F | Resp 14 | Wt 141.3 lb

## 2016-09-02 DIAGNOSIS — N183 Chronic kidney disease, stage 3 unspecified: Secondary | ICD-10-CM

## 2016-09-02 DIAGNOSIS — E538 Deficiency of other specified B group vitamins: Secondary | ICD-10-CM | POA: Diagnosis not present

## 2016-09-02 DIAGNOSIS — I10 Essential (primary) hypertension: Secondary | ICD-10-CM

## 2016-09-02 DIAGNOSIS — E785 Hyperlipidemia, unspecified: Secondary | ICD-10-CM

## 2016-09-02 DIAGNOSIS — Z5181 Encounter for therapeutic drug level monitoring: Secondary | ICD-10-CM

## 2016-09-02 DIAGNOSIS — G459 Transient cerebral ischemic attack, unspecified: Secondary | ICD-10-CM | POA: Diagnosis not present

## 2016-09-02 DIAGNOSIS — F0391 Unspecified dementia with behavioral disturbance: Secondary | ICD-10-CM | POA: Diagnosis not present

## 2016-09-02 DIAGNOSIS — E878 Other disorders of electrolyte and fluid balance, not elsewhere classified: Secondary | ICD-10-CM | POA: Diagnosis not present

## 2016-09-02 LAB — CBC WITH DIFFERENTIAL/PLATELET
Basophils Absolute: 0 cells/uL (ref 0–200)
Basophils Relative: 0 %
EOS ABS: 196 {cells}/uL (ref 15–500)
Eosinophils Relative: 2 %
HEMATOCRIT: 35.6 % (ref 35.0–45.0)
HEMOGLOBIN: 11.2 g/dL — AB (ref 11.7–15.5)
LYMPHS ABS: 3136 {cells}/uL (ref 850–3900)
LYMPHS PCT: 32 %
MCH: 28.2 pg (ref 27.0–33.0)
MCHC: 31.5 g/dL — AB (ref 32.0–36.0)
MCV: 89.7 fL (ref 80.0–100.0)
MONO ABS: 882 {cells}/uL (ref 200–950)
MPV: 9.8 fL (ref 7.5–12.5)
Monocytes Relative: 9 %
Neutro Abs: 5586 cells/uL (ref 1500–7800)
Neutrophils Relative %: 57 %
Platelets: 276 10*3/uL (ref 140–400)
RBC: 3.97 MIL/uL (ref 3.80–5.10)
RDW: 14 % (ref 11.0–15.0)
WBC: 9.8 10*3/uL (ref 3.8–10.8)

## 2016-09-02 LAB — LIPID PANEL
CHOLESTEROL: 252 mg/dL — AB (ref <200)
HDL: 72 mg/dL (ref >50)
LDL Cholesterol: 161 mg/dL — ABNORMAL HIGH (ref <100)
Total CHOL/HDL Ratio: 3.5 Ratio (ref <5.0)
Triglycerides: 94 mg/dL (ref <150)
VLDL: 19 mg/dL (ref <30)

## 2016-09-02 LAB — COMPLETE METABOLIC PANEL WITH GFR
ALBUMIN: 4.1 g/dL (ref 3.6–5.1)
ALK PHOS: 47 U/L (ref 33–130)
ALT: 4 U/L — AB (ref 6–29)
AST: 14 U/L (ref 10–35)
BILIRUBIN TOTAL: 0.3 mg/dL (ref 0.2–1.2)
BUN: 17 mg/dL (ref 7–25)
CO2: 31 mmol/L (ref 20–31)
CREATININE: 0.87 mg/dL (ref 0.60–0.88)
Calcium: 9.1 mg/dL (ref 8.6–10.4)
Chloride: 93 mmol/L — ABNORMAL LOW (ref 98–110)
GFR, Est African American: 71 mL/min (ref >=60)
GFR, Est Non African American: 61 mL/min (ref >=60)
GLUCOSE: 72 mg/dL (ref 65–99)
Potassium: 3.9 mmol/L (ref 3.5–5.3)
SODIUM: 136 mmol/L (ref 135–146)
TOTAL PROTEIN: 7.2 g/dL (ref 6.1–8.1)

## 2016-09-02 LAB — VITAMIN B12: Vitamin B-12: 1036 pg/mL (ref 200–1100)

## 2016-09-02 NOTE — Assessment & Plan Note (Signed)
Check labs 

## 2016-09-02 NOTE — Assessment & Plan Note (Signed)
Check cholesterol panel today; continue statin

## 2016-09-02 NOTE — Assessment & Plan Note (Signed)
Improved with adjustment of diuretic

## 2016-09-02 NOTE — Assessment & Plan Note (Signed)
Managed by Dr. Melrose Nakayama, on aricept and namenda

## 2016-09-02 NOTE — Assessment & Plan Note (Signed)
Check level 

## 2016-09-02 NOTE — Patient Instructions (Addendum)
Let's get labs today If you have not heard anything from my staff in a week about any orders/referrals/studies from today, please contact us here to follow-up [phone]  

## 2016-09-02 NOTE — Assessment & Plan Note (Signed)
No neurologic symptoms; follows with neurologist

## 2016-09-02 NOTE — Assessment & Plan Note (Signed)
Well controlled today.

## 2016-09-02 NOTE — Progress Notes (Signed)
BP 122/62 (BP Location: Left Arm, Patient Position: Sitting, Cuff Size: Normal)   Pulse 61   Temp 98.1 F (36.7 C)   Resp 14   Wt 141 lb 5 oz (64.1 kg)   SpO2 97%   BMI 24.26 kg/m    Subjective:    Patient ID: Melissa Combs, female    DOB: 1930-05-15, 83 y.o.   MRN: [id number]  HPI: Melissa Combs is a 83 y.o. female  Chief Complaint  Patient presents with  . Medication Refill  . Follow-up    Care plan FL2 needs to be sign    She is going to be moving to a new place; she is more agitated; was seeing neurologist, Dr. Melrose Nakayama, 05/18/16 was last visit Thinks that salt is falling off of her, scratching at things No wandering behavior; no unsafe behaviors other than scratching Disoriented and confused Urinary incontinence (all day) and bowel incontinence (occasionally, at night) No top dentures, so she tears up her food into little pieces and eats real slow; lost her top teeth; lost them; her daughter is going to try to get her upper plate No supplemental oxygen Hx of TIA; no new neurologic sx, seeing neurologist Low B12; last level was 390 on Jun 27, 2015 Lipid panel showed total 183, HDL 69, LDL 101  Chloride level had dropped, med adjusted, and came back up from 86 to 95 Renal insufficiency; Cr 1.16, decreased fluid pills and Cr dropped to 0.85 FL2 forms brought in by caregiver MAR reviewed  Depression screen [facility] 2/9 09/02/2016 08/27/2015 07/30/2015 06/04/2015 05/07/2015  Decreased Interest 1 0 0 0 0  Down, Depressed, Hopeless 1 0 0 0 0  PHQ - 2 Score 2 0 0 0 0  Altered sleeping 1 - - - -  Tired, decreased energy 0 - - - -  Change in appetite 1 - - - -  Feeling bad or failure about yourself  1 - - - -  Trouble concentrating 0 - - - -  Moving slowly or fidgety/restless 0 - - - -  Suicidal thoughts 0 - - - -  PHQ-9 Score 5 - - - -   Relevant past medical, surgical, family and social history reviewed Past Medical History:  Diagnosis Date  . Allergy   . Anemia   .  Arthritis   . B12 deficiency 11/06/2015   Less than 400 October 2016  . Chronic constipation   . Dementia   . Glaucoma   . History of fibrocystic disease of breast    biopsy done, negative  . History of hand fracture    left  . History of shingles 2014  . History of wrist fracture    left  . Hypertension   . Impacted cerumen   . Postherpetic neuralgia   . TIA (transient ischemic attack) Feb 2016   Past Surgical History:  Procedure Laterality Date  . NO PAST SURGERIES     Family History  Problem Relation Age of Onset  . Family history unknown: Yes   Social History  Substance Use Topics  . Smoking status: Never Smoker  . Smokeless tobacco: Never Used  . Alcohol use No   Interim medical history since last visit reviewed. Allergies and medications reviewed  Review of Systems Per HPI unless specifically indicated above     Objective:    BP 122/62 (BP Location: Left Arm, Patient Position: Sitting, Cuff Size: Normal)   Pulse 61   Temp 98.1 F (36.7 C)  Resp 14   Wt 141 lb 5 oz (64.1 kg)   SpO2 97%   BMI 24.26 kg/m   Wt Readings from Last 3 Encounters:  09/02/16 141 lb 5 oz (64.1 kg)  03/01/16 144 lb (65.3 kg)  11/26/15 142 lb 12.8 oz (64.8 kg)    Physical Exam  Constitutional: She appears well-developed and well-nourished. No distress.  Eyes: No scleral icterus.  Cardiovascular: Normal rate and regular rhythm.   Pulmonary/Chest: Effort normal. She has no wheezes.  Musculoskeletal:  Wearing compression stockings  Neurological: She is alert.  Year: "I don't know" Season: "Winter" Birthday: "I think it's in June, I can't remember"  Skin: No pallor.  Psychiatric: Her mood appears not anxious. Her speech is not delayed and not slurred. Cognition and memory are impaired. She does not exhibit a depressed mood. She exhibits abnormal recent memory and abnormal remote memory.  Very pleasant, demented, answers questions; poor historian    Results for orders placed  or performed in visit on 09/02/16  Lipid panel  Result Value Ref Range   Cholesterol 252 (H) <200 mg/dL   Triglycerides 94 <150 mg/dL   HDL 72 >50 mg/dL   Total CHOL/HDL Ratio 3.5 <5.0 Ratio   VLDL 19 <30 mg/dL   LDL Cholesterol 161 (H) <100 mg/dL  CBC with Differential/Platelet  Result Value Ref Range   WBC 9.8 3.8 - 10.8 K/uL   RBC 3.97 3.80 - 5.10 MIL/uL   Hemoglobin 11.2 (L) 11.7 - 15.5 g/dL   HCT 35.6 35.0 - 45.0 %   MCV 89.7 80.0 - 100.0 fL   MCH 28.2 27.0 - 33.0 pg   MCHC 31.5 (L) 32.0 - 36.0 g/dL   RDW 14.0 11.0 - 15.0 %   Platelets 276 140 - 400 K/uL   MPV 9.8 7.5 - 12.5 fL   Neutro Abs 5,586 1,500 - 7,800 cells/uL   Lymphs Abs 3,136 850 - 3,900 cells/uL   Monocytes Absolute 882 200 - 950 cells/uL   Eosinophils Absolute 196 15 - 500 cells/uL   Basophils Absolute 0 0 - 200 cells/uL   Neutrophils Relative % 57 %   Lymphocytes Relative 32 %   Monocytes Relative 9 %   Eosinophils Relative 2 %   Basophils Relative 0 %   Smear Review Criteria for review not met   Vitamin B12  Result Value Ref Range   Vitamin B-12 1,036 200 - 1,100 pg/mL  Methylmalonic Acid  Result Value Ref Range   Methylmalonic Acid, Quant    COMPLETE METABOLIC PANEL WITH GFR  Result Value Ref Range   Sodium 136 135 - 146 mmol/L   Potassium 3.9 3.5 - 5.3 mmol/L   Chloride 93 (L) 98 - 110 mmol/L   CO2 31 20 - 31 mmol/L   Glucose, Bld 72 65 - 99 mg/dL   BUN 17 7 - 25 mg/dL   Creat 0.87 0.60 - 0.88 mg/dL   Total Bilirubin 0.3 0.2 - 1.2 mg/dL   Alkaline Phosphatase 47 33 - 130 U/L   AST 14 10 - 35 U/L   ALT 4 (L) 6 - 29 U/L   Total Protein 7.2 6.1 - 8.1 g/dL   Albumin 4.1 3.6 - 5.1 g/dL   Calcium 9.1 8.6 - 10.4 mg/dL   GFR, Est African American 71 >=60 mL/min   GFR, Est Non African American 61 >=60 mL/min      Assessment & Plan:   Problem List Items Addressed This Visit  Cardiovascular and Mediastinum   TIA (transient ischemic attack)    No neurologic symptoms; follows with  neurologist      Relevant Orders   Lipid panel (Completed)   Hypertension    Well-controlled today        Nervous and Auditory   Dementia - Primary    Managed by Dr. Melrose Nakayama, on aricept and namenda      Relevant Medications   divalproex (DEPAKOTE) 250 MG DR tablet     Genitourinary   CKD (chronic kidney disease) stage 3, GFR 30-59 ml/min    Check creatinine today        Other   Medication monitoring encounter    Check labs      Relevant Orders   CBC with Differential/Platelet (Completed)   COMPLETE METABOLIC PANEL WITH GFR (Completed)   Hypochloremia    Improved with adjustment of diuretic      Relevant Orders   COMPLETE METABOLIC PANEL WITH GFR (Completed)   Dyslipidemia    Check cholesterol panel today; continue statin      Relevant Orders   Lipid panel (Completed)   B12 deficiency    Check level      Relevant Orders   Vitamin B12 (Completed)   Methylmalonic Acid (Completed)     FL2 forms completed  Follow up plan: Return in about 4 months (around 12/31/2016) for follow-up.  An after-visit summary was printed and given to the patient at [facility].  Please see the patient instructions which may contain other information and recommendations beyond what is mentioned above in the assessment and plan.  Meds ordered this encounter  Medications  . divalproex (DEPAKOTE) 250 MG DR tablet    Sig: Take by mouth.    Orders Placed This Encounter  Procedures  . Lipid panel  . CBC with Differential/Platelet  . Vitamin B12  . Methylmalonic Acid  . COMPLETE METABOLIC PANEL WITH GFR

## 2016-09-02 NOTE — Assessment & Plan Note (Signed)
Check creatinine today. 

## 2016-09-03 ENCOUNTER — Other Ambulatory Visit: Payer: Self-pay | Admitting: Family Medicine

## 2016-09-03 DIAGNOSIS — Z5181 Encounter for therapeutic drug level monitoring: Secondary | ICD-10-CM

## 2016-09-03 DIAGNOSIS — G459 Transient cerebral ischemic attack, unspecified: Secondary | ICD-10-CM

## 2016-09-03 DIAGNOSIS — E785 Hyperlipidemia, unspecified: Secondary | ICD-10-CM

## 2016-09-03 MED ORDER — PRAVASTATIN SODIUM 20 MG PO TABS: 20.0000 mg | ORAL_TABLET | Freq: Every day | ORAL | 1 refills | Status: DC

## 2016-09-03 NOTE — Assessment & Plan Note (Signed)
Start back on statin 

## 2016-09-03 NOTE — Progress Notes (Signed)
Start back on statin; recheck lipids and sgpt in 6 weeks

## 2016-09-03 NOTE — Assessment & Plan Note (Signed)
Monitor sgpt on statin 

## 2016-09-05 LAB — METHYLMALONIC ACID, SERUM: METHYLMALONIC ACID, QUANT: 109 nmol/L (ref 87–318)

## 2016-09-07 ENCOUNTER — Other Ambulatory Visit: Payer: Self-pay | Admitting: Family Medicine

## 2016-09-08 NOTE — Telephone Encounter (Signed)
Cr and K+ reviewed Rx approved 

## 2016-09-15 ENCOUNTER — Other Ambulatory Visit: Payer: Self-pay

## 2016-09-16 NOTE — Telephone Encounter (Signed)
[place] web site reviewed last 12+ months; ER visit noted Last Rx approved by me was for one month in July 2017, supposed to be scheduled Please contact assisted living facility, get [facility], where are they getting tramadol from (pharmacy), and find out if there has been another prescriber for this because I've not approved any since July's one month Rx Approval on-hold until more info received

## 2016-09-18 NOTE — Telephone Encounter (Signed)
I have called Patients assisted living above and beyond she no longer resides there and they no longer have her MAR.  I called emergency contact in chart which was her sister and she gave me the name of the new place she is staying [place] assisted [facility].  I call them they state they requested the med, but does not no if she was previously taking.  I then tried to call her pharmacy [facility] and [facility] are also new.  So the care provider at [facility] assisted living is going to make a f/u appt with you to [employer]

## 2016-09-22 ENCOUNTER — Ambulatory Visit (INDEPENDENT_AMBULATORY_CARE_PROVIDER_SITE_OTHER): Payer: Medicare Other | Admitting: Family Medicine

## 2016-09-22 ENCOUNTER — Encounter: Payer: Self-pay | Admitting: Family Medicine

## 2016-09-22 VITALS — BP 124/60 | HR 70 | Temp 98.4°F | Resp 16 | Wt 149.2 lb

## 2016-09-22 DIAGNOSIS — K5909 Other constipation: Secondary | ICD-10-CM

## 2016-09-22 DIAGNOSIS — R600 Localized edema: Secondary | ICD-10-CM | POA: Diagnosis not present

## 2016-09-22 DIAGNOSIS — M1990 Unspecified osteoarthritis, unspecified site: Secondary | ICD-10-CM | POA: Diagnosis not present

## 2016-09-22 DIAGNOSIS — B0229 Other postherpetic nervous system involvement: Secondary | ICD-10-CM | POA: Diagnosis not present

## 2016-09-22 DIAGNOSIS — F0391 Unspecified dementia with behavioral disturbance: Secondary | ICD-10-CM

## 2016-09-22 DIAGNOSIS — S8012XA Contusion of left lower leg, initial encounter: Secondary | ICD-10-CM

## 2016-09-22 DIAGNOSIS — R32 Unspecified urinary incontinence: Secondary | ICD-10-CM

## 2016-09-22 MED ORDER — DIVALPROEX SODIUM 125 MG PO CSDR: 250.0000 mg | DELAYED_RELEASE_CAPSULE | Freq: Two times a day (BID) | ORAL | 5 refills | Status: DC

## 2016-09-22 MED ORDER — HUGGIES PULL-UPS MISC: 1.0000 | Freq: Three times a day (TID) | 5 refills | Status: DC

## 2016-09-22 MED ORDER — GABAPENTIN 600 MG PO TABS: 300.0000 mg | ORAL_TABLET | Freq: Two times a day (BID) | ORAL | 5 refills | Status: DC

## 2016-09-22 MED ORDER — JOBST ACTIVE 15-20MMHG MEDIUM MISC: 1 refills | Status: DC

## 2016-09-22 MED ORDER — DICLOFENAC SODIUM 1 % TD GEL: TRANSDERMAL | 5 refills | Status: DC

## 2016-09-22 NOTE — Progress Notes (Signed)
BP 124/60   Pulse 70   Temp 98.4 F (36.9 C)   Resp 16   Wt 149 lb 3 oz (67.7 kg)   SpO2 96%   BMI 25.61 kg/m    Subjective:    Patient ID: Melissa Combs, female    DOB: 07/27/1930, 83 y.o.   MRN: [id number]  HPI: Bethenny Palella is a 83 y.o. female  Chief Complaint  Patient presents with  . Medication Refill    Patient is here with caregiver from new group home Now at [facility] Ms. Alvarado is here with her today We spent most of the visit reviewing her medicine list and problem list to see if things could be stopped No complaints of pain; caregiver does not think she has any pain Had a little bruise on the right shin; swelling that is tender; hit it on something, now has sore swelling Not noticing any depression, thinks citalopram can be discontinued Having regular BMs Gabapentin large pill, wants to know if it can be smaller Needs VPA Needs order for mechanical soft diet Needs order for pull-ups, medium size; urinary and stool incontinence  Depression screen [facility] 2/9 09/22/2016 09/02/2016 08/27/2015 07/30/2015 06/04/2015  Decreased Interest 0 1 0 0 0  Down, Depressed, Hopeless 0 1 0 0 0  PHQ - 2 Score 0 2 0 0 0  Altered sleeping - 1 - - -  Tired, decreased energy - 0 - - -  Change in appetite - 1 - - -  Feeling bad or failure about yourself  - 1 - - -  Trouble concentrating - 0 - - -  Moving slowly or fidgety/restless - 0 - - -  Suicidal thoughts - 0 - - -  PHQ-9 Score - 5 - - -   Relevant past medical, surgical, family and social history reviewed Past Medical History:  Diagnosis Date  . Allergy   . Anemia   . Arthritis   . B12 deficiency 11/06/2015   Less than 400 October 2016  . Chronic constipation   . Dementia   . Glaucoma   . History of fibrocystic disease of breast    biopsy done, negative  . History of hand fracture    left  . History of shingles 2014  . History of wrist fracture    left  . Hypertension   . Impacted cerumen   .  Postherpetic neuralgia   . TIA (transient ischemic attack) Feb 2016   Past Surgical History:  Procedure Laterality Date  . NO PAST SURGERIES     Family History  Problem Relation Age of Onset  . Family history unknown: Yes   Social History  Substance Use Topics  . Smoking status: Never Smoker  . Smokeless tobacco: Never Used  . Alcohol use No   Interim medical history since last visit reviewed. Allergies and medications reviewed  Review of Systems Per HPI unless specifically indicated above     Objective:    BP 124/60   Pulse 70   Temp 98.4 F (36.9 C)   Resp 16   Wt 149 lb 3 oz (67.7 kg)   SpO2 96%   BMI 25.61 kg/m   Wt Readings from Last 3 Encounters:  09/22/16 149 lb 3 oz (67.7 kg)  09/02/16 141 lb 5 oz (64.1 kg)  03/01/16 144 lb (65.3 kg)    Physical Exam  Constitutional: She appears well-developed and well-nourished. No distress.  Eyes: No scleral icterus.  Cardiovascular: Normal rate and  regular rhythm.   Pulmonary/Chest: Effort normal. She has no wheezes.  Musculoskeletal:  Wearing compression stockings  Neurological: She is alert.  Pleasantly demented  Skin: No pallor.  Over the left anterior shin, there is a tender nodular area of swelling, purplish, about quarter-sized, tense; no proximal red streaking  Psychiatric: Her mood appears not anxious. Her speech is not delayed and not slurred. Cognition and memory are impaired. She does not exhibit a depressed mood. She exhibits abnormal recent memory and abnormal remote memory.  Very pleasant, demented, answers questions; poor historian       Assessment & Plan:   Problem List Items Addressed This Visit      Digestive   Chronic constipation    Changed orders; see order sheet given to group home staff        Nervous and Auditory   Postherpetic neuralgia    Changed size of gabapentin, as well as formulation of divalproate      Relevant Medications   divalproex (DEPAKOTE SPRINKLES) 125 MG  capsule   gabapentin (NEURONTIN) 600 MG tablet   Dementia    Requiring 24/7 care      Relevant Medications   divalproex (DEPAKOTE SPRINKLES) 125 MG capsule   gabapentin (NEURONTIN) 600 MG tablet     Musculoskeletal and Integument   Arthritis    Will use topical voltaren gel; Rx provided        Other   Urinary incontinence    Rx for incontinence diapers      Bilateral leg edema    Compression stockings prescribed       Other Visit Diagnoses    Hematoma of leg, left, initial encounter    -  Primary   area prepped with alcoholx3, cryo gently then #11 blade to express some contents; covered with abx ointment, pressure dressing; wound care disc       Follow up plan: No Follow-up on file.  An after-visit summary was printed and given to the patient at [place].  Please see the patient instructions which may contain other information and recommendations beyond what is mentioned above in the assessment and plan.  Meds ordered this encounter  Medications  . diclofenac sodium (VOLTAREN) 1 % GEL    Sig: Apply 2 grams to each knee three times a day while awake, for example, 8 am, 2 pm, and 8 pm    Dispense:  100 g    Refill:  5  . divalproex (DEPAKOTE SPRINKLES) 125 MG capsule    Sig: Take 2 capsules (250 mg total) by mouth 2 (two) times daily.    Dispense:  120 capsule    Refill:  5    Cancel the regular divalproex, start sprinkles instead  . gabapentin (NEURONTIN) 600 MG tablet    Sig: Take 0.5 tablets (300 mg total) by mouth 2 (two) times daily.    Dispense:  30 tablet    Refill:  5    Cancel the 300 mg capsules BID; we're doing something smaller  . Diapers & Supplies (HUGGIES PULL-UPS) MISC    Sig: 1 each by Does not apply route 3 (three) times daily. Adult size medium, urinary and stool incontinence, LON 99 months    Dispense:  100 each    Refill:  5  . Elastic Bandages & Supports (JOBST ACTIVE 15-20MMHG MEDIUM) MISC    Sig: Put on legs every morning, remove every  night    Dispense:  2 each    Refill:  1  No orders of the defined types were placed in this encounter.

## 2016-09-22 NOTE — Patient Instructions (Addendum)
Let's make the medicine changes Call me with any issues

## 2016-09-28 ENCOUNTER — Telehealth: Payer: Self-pay

## 2016-09-28 MED ORDER — CITALOPRAM HYDROBROMIDE 10 MG PO TABS: 10.0000 mg | ORAL_TABLET | Freq: Every day | ORAL | 11 refills | Status: DC

## 2016-09-28 NOTE — Telephone Encounter (Signed)
Yes, Rx sent Meds ordered this encounter  Medications  . citalopram (CELEXA) 10 MG tablet    Sig: Take 1 tablet (10 mg total) by mouth daily.    Dispense:  30 tablet    Refill:  11

## 2016-09-28 NOTE — Telephone Encounter (Signed)
Teresa care giver at home called states wants to see if you can put patient back on celexa? States she is depressed and crying a lot. [phone], if so fax order to [facility]  [phone]

## 2016-09-28 NOTE — Telephone Encounter (Signed)
error 

## 2016-10-01 ENCOUNTER — Telehealth: Payer: Self-pay | Admitting: Family Medicine

## 2016-10-01 NOTE — Telephone Encounter (Signed)
Did the lab results from January 3rd get relayed to staff? I want to make sure she's on the medicine and we are set up to recheck her cholesterol 6 weeks later Thank you

## 2016-10-02 ENCOUNTER — Other Ambulatory Visit: Payer: Self-pay

## 2016-10-02 MED ORDER — DONEPEZIL HCL 10 MG PO TABS: 10.0000 mg | ORAL_TABLET | Freq: Every day | ORAL | 11 refills | Status: DC

## 2016-10-02 NOTE — Telephone Encounter (Signed)
Order faxed.

## 2016-10-03 DIAGNOSIS — R32 Unspecified urinary incontinence: Secondary | ICD-10-CM | POA: Insufficient documentation

## 2016-10-03 NOTE — Assessment & Plan Note (Signed)
Changed size of gabapentin, as well as formulation of divalproate

## 2016-10-03 NOTE — Assessment & Plan Note (Signed)
Requiring 24/7 care 

## 2016-10-03 NOTE — Assessment & Plan Note (Signed)
Will use topical voltaren gel; Rx provided

## 2016-10-03 NOTE — Assessment & Plan Note (Signed)
Rx for incontinence diapers

## 2016-10-03 NOTE — Assessment & Plan Note (Signed)
Changed orders; see order sheet given to group home staff

## 2016-10-03 NOTE — Assessment & Plan Note (Signed)
Compression stockings prescribed

## 2016-10-05 ENCOUNTER — Other Ambulatory Visit: Payer: Self-pay | Admitting: Family Medicine

## 2016-10-15 ENCOUNTER — Other Ambulatory Visit: Payer: Self-pay | Admitting: Family Medicine

## 2016-10-15 NOTE — Telephone Encounter (Signed)
Her eye drops need to be prescribed by her eye doctor

## 2016-10-19 ENCOUNTER — Telehealth: Payer: Self-pay | Admitting: Family Medicine

## 2016-10-19 NOTE — Telephone Encounter (Signed)
Note from pharmacy received Patient apparently not taking her pravastatin Please find out why Get her back on this if no contraindication Thank you

## 2016-10-20 NOTE — Telephone Encounter (Signed)
Melissa Combs called assisted living and they say she is currently taking pravastatin daily.

## 2016-10-22 NOTE — Telephone Encounter (Signed)
Still not able to get in contact with patient care givers. I will send out a note with her labs acctached and a letter explaining Dr.lada would like for her to come in and have labs done.

## 2016-10-22 NOTE — Telephone Encounter (Signed)
Please check on this one too and sign off when done; thank you

## 2016-10-26 ENCOUNTER — Other Ambulatory Visit: Payer: Self-pay | Admitting: Family Medicine

## 2016-10-26 NOTE — Telephone Encounter (Signed)
I don't prescribe her eye medicines The pharmacy needs to send the refill requests for her eye drops to her eye doctor Thank you

## 2016-10-27 NOTE — Telephone Encounter (Signed)
Called the pharmacy and talked to the pharm tech and mention to her Dr. lada does not prescribed her eye drops.  She stated she will call her caregivers and speak with them to see if she can get information about who she need to send the eye drops refill request to.

## 2016-10-29 ENCOUNTER — Telehealth: Payer: Self-pay

## 2016-10-29 NOTE — Telephone Encounter (Signed)
Tried so many times to get in contact with the pt caregiver. We are aware that she moved facility but her address was never updated. I was able to get in contact with the new caregiver to update information. Updated the new information confirm the number that is provided. Also mention to her the updates Dr. lada would  Like to  do with her medication. Also, updated  lab results and a letter we got from [facility] stated a concern with her lisinopril 20mg . Seh stated to me that she had extra pills but she is taking all her medication. I went over the dosage, route and name of the medication just to confirm.

## 2016-11-03 DIAGNOSIS — H2513 Age-related nuclear cataract, bilateral: Secondary | ICD-10-CM | POA: Diagnosis not present

## 2016-11-25 ENCOUNTER — Other Ambulatory Visit: Payer: Self-pay | Admitting: Family Medicine

## 2016-12-01 DIAGNOSIS — G459 Transient cerebral ischemic attack, unspecified: Secondary | ICD-10-CM | POA: Diagnosis not present

## 2016-12-01 DIAGNOSIS — F0390 Unspecified dementia without behavioral disturbance: Secondary | ICD-10-CM | POA: Diagnosis not present

## 2016-12-15 ENCOUNTER — Other Ambulatory Visit: Payer: Self-pay | Admitting: Family Medicine

## 2016-12-30 DIAGNOSIS — H2513 Age-related nuclear cataract, bilateral: Secondary | ICD-10-CM | POA: Diagnosis not present

## 2016-12-31 ENCOUNTER — Ambulatory Visit (INDEPENDENT_AMBULATORY_CARE_PROVIDER_SITE_OTHER): Payer: Medicare Other | Admitting: Family Medicine

## 2016-12-31 ENCOUNTER — Encounter: Payer: Self-pay | Admitting: Family Medicine

## 2016-12-31 DIAGNOSIS — M170 Bilateral primary osteoarthritis of knee: Secondary | ICD-10-CM

## 2016-12-31 DIAGNOSIS — G459 Transient cerebral ischemic attack, unspecified: Secondary | ICD-10-CM | POA: Diagnosis not present

## 2016-12-31 DIAGNOSIS — I10 Essential (primary) hypertension: Secondary | ICD-10-CM

## 2016-12-31 DIAGNOSIS — D631 Anemia in chronic kidney disease: Secondary | ICD-10-CM

## 2016-12-31 DIAGNOSIS — F0391 Unspecified dementia with behavioral disturbance: Secondary | ICD-10-CM

## 2016-12-31 DIAGNOSIS — E538 Deficiency of other specified B group vitamins: Secondary | ICD-10-CM | POA: Diagnosis not present

## 2016-12-31 DIAGNOSIS — Z5181 Encounter for therapeutic drug level monitoring: Secondary | ICD-10-CM | POA: Diagnosis not present

## 2016-12-31 DIAGNOSIS — N183 Chronic kidney disease, stage 3 unspecified: Secondary | ICD-10-CM

## 2016-12-31 DIAGNOSIS — H2511 Age-related nuclear cataract, right eye: Secondary | ICD-10-CM | POA: Diagnosis not present

## 2016-12-31 DIAGNOSIS — E785 Hyperlipidemia, unspecified: Secondary | ICD-10-CM

## 2016-12-31 MED ORDER — PRAVASTATIN SODIUM 20 MG PO TABS: 20.0000 mg | ORAL_TABLET | Freq: Every day | ORAL | 1 refills | Status: DC

## 2016-12-31 NOTE — Assessment & Plan Note (Signed)
Start back on statin; continue aspirin

## 2016-12-31 NOTE — Assessment & Plan Note (Signed)
voltaren gel topically

## 2016-12-31 NOTE — Assessment & Plan Note (Signed)
Controlled today 

## 2016-12-31 NOTE — Assessment & Plan Note (Signed)
Chronic, stable; reviewed H/H since July 2016

## 2016-12-31 NOTE — Assessment & Plan Note (Signed)
Start back on statin; recheck labs around February 18, 2017

## 2016-12-31 NOTE — Patient Instructions (Signed)
Start the pravastatin 20 mg one by mouth at bedtime Return for fasting labs only on Thursday February 18, 2017 Keep up the good work

## 2016-12-31 NOTE — Progress Notes (Signed)
BP 136/78   Pulse 88   Temp 98.3 F (36.8 C) (Oral)   Resp 14   Wt 139 lb 11.2 oz (63.4 kg)   SpO2 97%   BMI 23.98 kg/m    Subjective:    Patient ID: Melissa Combs, female    DOB: 1930/02/15, 83 y.o.   MRN: [id number]  HPI: Melissa Combs is a 83 y.o. female  Chief Complaint  Patient presents with  . Follow-up    4 MONTHS    HPI  The left eye is painful; saw eye doctor yesterday; discussed surgery, but they are going to talk to daughter; they went for pre-surgery, then decided not to do surgery; caregiver not sure why no surgery now  She has dementia; told me when I walked in the room that her mother and father are dead and she is sad  HTN; well-controlled; they check occasionally at the home, every week and it's good; do not allow salt at the table  Back on citalopram; no chnages desired by staff  Dementia; neurologist stopped gabapentin and continue namenda and aricept  Letter about dentures from last year; patient has no upper teeth; has lower teeth; not affecting her ability to eat  She still wants to go to the bathroom every five minutes; that's her dementia  She is not on statin; start that now; caregiver says that they did not know about the statin; chart reviewed and message was left with old group home right before she moved, so it appears that was what happened  Depression screen [facility] 2/9 12/31/2016 09/22/2016 09/02/2016 08/27/2015 07/30/2015  Decreased Interest 0 0 1 0 0  Down, Depressed, Hopeless 0 0 1 0 0  PHQ - 2 Score 0 0 2 0 0  Altered sleeping - - 1 - -  Tired, decreased energy - - 0 - -  Change in appetite - - 1 - -  Feeling bad or failure about yourself  - - 1 - -  Trouble concentrating - - 0 - -  Moving slowly or fidgety/restless - - 0 - -  Suicidal thoughts - - 0 - -  PHQ-9 Score - - 5 - -    Relevant past medical, surgical, family and social history reviewed Past Medical History:  Diagnosis Date  . Allergy   . Anemia   . Arthritis   .  B12 deficiency 11/06/2015   Less than 400 October 2016  . Chronic constipation   . Dementia   . Glaucoma   . History of fibrocystic disease of breast    biopsy done, negative  . History of hand fracture    left  . History of shingles 2014  . History of wrist fracture    left  . Hypertension   . Impacted cerumen   . Postherpetic neuralgia   . TIA (transient ischemic attack) Feb 2016   Past Surgical History:  Procedure Laterality Date  . NO PAST SURGERIES     Family History  Problem Relation Age of Onset  . Family history unknown: Yes   Social History  Substance Use Topics  . Smoking status: Never Smoker  . Smokeless tobacco: Never Used  . Alcohol use No    Interim medical history since last visit reviewed. Allergies and medications reviewed  Review of Systems Per HPI unless specifically indicated above     Objective:    BP 136/78   Pulse 88   Temp 98.3 F (36.8 C) (Oral)   Resp 14  Wt 139 lb 11.2 oz (63.4 kg)   SpO2 97%   BMI 23.98 kg/m   Wt Readings from Last 3 Encounters:  12/31/16 139 lb 11.2 oz (63.4 kg)  09/22/16 149 lb 3 oz (67.7 kg)  09/02/16 141 lb 5 oz (64.1 kg)    Physical Exam  Constitutional: She appears well-developed and well-nourished. No distress.  Eyes: Right conjunctiva is not injected. Left conjunctiva is not injected. No scleral icterus.  Cardiovascular: Normal rate and regular rhythm.   Pulmonary/Chest: Effort normal. She has no wheezes.  Musculoskeletal:  Wearing compression stockings  Neurological: She is alert.  Pleasantly demented, but did get sad at one point saying that her mother and father had died  Skin: No pallor.  Psychiatric: Her mood appears not anxious. Her speech is not delayed and not slurred. She is not agitated, not aggressive and not combative. Cognition and memory are impaired. She does not exhibit a depressed mood. She exhibits abnormal recent memory and abnormal remote memory.  Very pleasant, demented, answers  questions; poor historian; sang a song   Results for orders placed or performed in visit on 09/02/16  Lipid panel  Result Value Ref Range   Cholesterol 252 (H) <200 mg/dL   Triglycerides 94 <150 mg/dL   HDL 72 >50 mg/dL   Total CHOL/HDL Ratio 3.5 <5.0 Ratio   VLDL 19 <30 mg/dL   LDL Cholesterol 161 (H) <100 mg/dL  CBC with Differential/Platelet  Result Value Ref Range   WBC 9.8 3.8 - 10.8 K/uL   RBC 3.97 3.80 - 5.10 MIL/uL   Hemoglobin 11.2 (L) 11.7 - 15.5 g/dL   HCT 35.6 35.0 - 45.0 %   MCV 89.7 80.0 - 100.0 fL   MCH 28.2 27.0 - 33.0 pg   MCHC 31.5 (L) 32.0 - 36.0 g/dL   RDW 14.0 11.0 - 15.0 %   Platelets 276 140 - 400 K/uL   MPV 9.8 7.5 - 12.5 fL   Neutro Abs 5,586 1,500 - 7,800 cells/uL   Lymphs Abs 3,136 850 - 3,900 cells/uL   Monocytes Absolute 882 200 - 950 cells/uL   Eosinophils Absolute 196 15 - 500 cells/uL   Basophils Absolute 0 0 - 200 cells/uL   Neutrophils Relative % 57 %   Lymphocytes Relative 32 %   Monocytes Relative 9 %   Eosinophils Relative 2 %   Basophils Relative 0 %   Smear Review Criteria for review not met   Vitamin B12  Result Value Ref Range   Vitamin B-12 1,036 200 - 1,100 pg/mL  Methylmalonic Acid  Result Value Ref Range   Methylmalonic Acid, Quant 109 87 - 318 nmol/L  COMPLETE METABOLIC PANEL WITH GFR  Result Value Ref Range   Sodium 136 135 - 146 mmol/L   Potassium 3.9 3.5 - 5.3 mmol/L   Chloride 93 (L) 98 - 110 mmol/L   CO2 31 20 - 31 mmol/L   Glucose, Bld 72 65 - 99 mg/dL   BUN 17 7 - 25 mg/dL   Creat 0.87 0.60 - 0.88 mg/dL   Total Bilirubin 0.3 0.2 - 1.2 mg/dL   Alkaline Phosphatase 47 33 - 130 U/L   AST 14 10 - 35 U/L   ALT 4 (L) 6 - 29 U/L   Total Protein 7.2 6.1 - 8.1 g/dL   Albumin 4.1 3.6 - 5.1 g/dL   Calcium 9.1 8.6 - 10.4 mg/dL   GFR, Est African American 71 >=60 mL/min   GFR, Est Non African  American 61 >=60 mL/min      Assessment & Plan:   Problem List Items Addressed This Visit      Cardiovascular and  Mediastinum   TIA (transient ischemic attack)    Start back on statin; continue aspirin      Relevant Medications   pravastatin (PRAVACHOL) 20 MG tablet   Hypertension    Controlled today      Relevant Medications   pravastatin (PRAVACHOL) 20 MG tablet     Nervous and Auditory   Dementia    Persistent, progressive; seeing neurologist; requiring 24/7 care      Relevant Medications   divalproex (DEPAKOTE SPRINKLES) 125 MG capsule     Musculoskeletal and Integument   DJD (degenerative joint disease) of knee    voltaren gel topically        Other   Medication monitoring encounter    Check labs June 21st      Dyslipidemia    Start back on statin; recheck labs around February 18, 2017      Relevant Medications   pravastatin (PRAVACHOL) 20 MG tablet   B12 deficiency    On supplement      Anemia    Chronic, stable; reviewed H/H since July 2016          Follow up plan: Return in about 7 weeks (around 02/18/2017) for fasting labs only; 4 months with Dr. Sanda Klein.  An after-visit summary was printed and given to the patient at [facility].  Please see the patient instructions which may contain other information and recommendations beyond what is mentioned above in the assessment and plan.  Meds ordered this encounter  Medications  . divalproex (DEPAKOTE SPRINKLES) 125 MG capsule    Sig: Take 1 capsule (125 mg total) by mouth 2 (two) times daily.    Cancel the regular divalproex, start sprinkles instead  . DISCONTD: pravastatin (PRAVACHOL) 20 MG tablet    Sig: Take 1 tablet (20 mg total) by mouth daily.    Dispense:  30 tablet    Refill:  1  . brimonidine-timolol (COMBIGAN) 0.2-0.5 % ophthalmic solution    Sig: Place 1 drop into both eyes 2 (two) times daily.  . pravastatin (PRAVACHOL) 20 MG tablet    Sig: Take 1 tablet (20 mg total) by mouth at bedtime.    Dispense:  30 tablet    Refill:  1    No orders of the defined types were placed in this encounter.

## 2016-12-31 NOTE — Assessment & Plan Note (Signed)
Persistent, progressive; seeing neurologist; requiring 24/7 care

## 2016-12-31 NOTE — Assessment & Plan Note (Signed)
On supplement 

## 2016-12-31 NOTE — Assessment & Plan Note (Signed)
Check labs June 21st

## 2017-01-12 ENCOUNTER — Encounter: Payer: Self-pay | Admitting: *Deleted

## 2017-01-12 ENCOUNTER — Ambulatory Visit: Payer: Medicare Other | Admitting: Certified Registered Nurse Anesthetist

## 2017-01-12 ENCOUNTER — Encounter
Admission: RE | Disposition: A | Discharge status: Home / Self Care | Payer: Self-pay | Source: Ambulatory Visit | Attending: Ophthalmology

## 2017-01-12 ENCOUNTER — Ambulatory Visit
Admission: RE | Admit: 2017-01-12 | Discharge: 2017-01-12 | Disposition: A | Discharge status: Home / Self Care | Payer: Medicare Other | Source: Ambulatory Visit | Attending: Ophthalmology | Admitting: Ophthalmology

## 2017-01-12 DIAGNOSIS — Z8673 Personal history of transient ischemic attack (TIA), and cerebral infarction without residual deficits: Secondary | ICD-10-CM | POA: Insufficient documentation

## 2017-01-12 DIAGNOSIS — G709 Myoneural disorder, unspecified: Secondary | ICD-10-CM | POA: Diagnosis not present

## 2017-01-12 DIAGNOSIS — D649 Anemia, unspecified: Secondary | ICD-10-CM | POA: Diagnosis not present

## 2017-01-12 DIAGNOSIS — H2511 Age-related nuclear cataract, right eye: Secondary | ICD-10-CM | POA: Insufficient documentation

## 2017-01-12 DIAGNOSIS — M1990 Unspecified osteoarthritis, unspecified site: Secondary | ICD-10-CM | POA: Insufficient documentation

## 2017-01-12 DIAGNOSIS — Z955 Presence of coronary angioplasty implant and graft: Secondary | ICD-10-CM | POA: Insufficient documentation

## 2017-01-12 DIAGNOSIS — I10 Essential (primary) hypertension: Secondary | ICD-10-CM | POA: Insufficient documentation

## 2017-01-12 DIAGNOSIS — B0229 Other postherpetic nervous system involvement: Secondary | ICD-10-CM | POA: Insufficient documentation

## 2017-01-12 DIAGNOSIS — F0390 Unspecified dementia without behavioral disturbance: Secondary | ICD-10-CM | POA: Insufficient documentation

## 2017-01-12 HISTORY — DX: Family history of other specified conditions: Z84.89

## 2017-01-12 HISTORY — DX: Pneumonia, unspecified organism: J18.9

## 2017-01-12 HISTORY — PX: CATARACT EXTRACTION W/PHACO: SHX586

## 2017-01-12 SURGERY — PHACOEMULSIFICATION, CATARACT, WITH IOL INSERTION
Anesthesia: General | Site: Eye | Laterality: Right | Wound class: Clean

## 2017-01-12 MED ORDER — POVIDONE-IODINE 5 % OP SOLN
OPHTHALMIC | Status: AC
  Filled 2017-01-12: qty 30

## 2017-01-12 MED ORDER — NA CHONDROIT SULF-NA HYALURON 40-17 MG/ML IO SOLN
INTRAOCULAR | Status: DC | PRN
  Administered 2017-01-12: 1 mL via INTRAOCULAR

## 2017-01-12 MED ORDER — PROPOFOL 500 MG/50ML IV EMUL
INTRAVENOUS | Status: DC | PRN
  Administered 2017-01-12: 100 ug/kg/min via INTRAVENOUS

## 2017-01-12 MED ORDER — FENTANYL CITRATE (PF) 100 MCG/2ML IJ SOLN: 25.0000 ug | INTRAMUSCULAR | Status: DC | PRN

## 2017-01-12 MED ORDER — ARMC OPHTHALMIC DILATING DROPS
OPHTHALMIC | Status: AC
  Filled 2017-01-12: qty 0.4

## 2017-01-12 MED ORDER — EPINEPHRINE PF 1 MG/ML IJ SOLN
INTRAMUSCULAR | Status: AC
  Filled 2017-01-12: qty 2

## 2017-01-12 MED ORDER — CARBACHOL 0.01 % IO SOLN
INTRAOCULAR | Status: DC | PRN
  Administered 2017-01-12: 0.5 mL via INTRAOCULAR

## 2017-01-12 MED ORDER — MOXIFLOXACIN HCL 0.5 % OP SOLN
OPHTHALMIC | Status: DC | PRN
  Administered 2017-01-12: 0.2 mL via OPHTHALMIC

## 2017-01-12 MED ORDER — ARMC OPHTHALMIC DILATING DROPS
1.0000 "application " | OPHTHALMIC | Status: AC
  Administered 2017-01-12 (×3): 1 via OPHTHALMIC

## 2017-01-12 MED ORDER — POVIDONE-IODINE 5 % OP SOLN
OPHTHALMIC | Status: DC | PRN
  Administered 2017-01-12: 1 via OPHTHALMIC

## 2017-01-12 MED ORDER — PROPOFOL 10 MG/ML IV BOLUS
INTRAVENOUS | Status: AC
  Filled 2017-01-12: qty 20

## 2017-01-12 MED ORDER — MOXIFLOXACIN HCL 0.5 % OP SOLN
OPHTHALMIC | Status: AC
  Filled 2017-01-12: qty 3

## 2017-01-12 MED ORDER — NA CHONDROIT SULF-NA HYALURON 40-17 MG/ML IO SOLN
INTRAOCULAR | Status: AC
  Filled 2017-01-12: qty 1

## 2017-01-12 MED ORDER — BSS IO SOLN
INTRAOCULAR | Status: DC | PRN
  Administered 2017-01-12: 08:00:00 via OPHTHALMIC

## 2017-01-12 MED ORDER — LIDOCAINE HCL (PF) 2 % IJ SOLN
INTRAMUSCULAR | Status: AC
  Filled 2017-01-12: qty 2

## 2017-01-12 MED ORDER — PROPOFOL 10 MG/ML IV BOLUS
INTRAVENOUS | Status: DC | PRN
  Administered 2017-01-12 (×2): 60 mg via INTRAVENOUS

## 2017-01-12 MED ORDER — SODIUM CHLORIDE 0.9 % IV SOLN
INTRAVENOUS | Status: DC
  Administered 2017-01-12: 07:00:00 via INTRAVENOUS

## 2017-01-12 MED ORDER — MOXIFLOXACIN HCL 0.5 % OP SOLN
1.0000 [drp] | OPHTHALMIC | Status: AC
  Administered 2017-01-12 (×3): 1 [drp] via OPHTHALMIC

## 2017-01-12 MED ORDER — LIDOCAINE HCL (PF) 4 % IJ SOLN: INTRAMUSCULAR | Status: DC | PRN

## 2017-01-12 SURGICAL SUPPLY — 14 items
GLOVE BIO SURGEON STRL SZ8 (GLOVE) ×3 IMPLANT
GLOVE BIOGEL M 6.5 STRL (GLOVE) ×3 IMPLANT
GLOVE SURG LX 8.0 MICRO (GLOVE) ×2
GLOVE SURG LX STRL 8.0 MICRO (GLOVE) ×1 IMPLANT
GOWN STRL REUS W/ TWL LRG LVL3 (GOWN DISPOSABLE) ×2 IMPLANT
GOWN STRL REUS W/TWL LRG LVL3 (GOWN DISPOSABLE) ×4
LENS IOL TECNIS ITEC 23.5 (Intraocular Lens) ×3 IMPLANT
PACK CATARACT (MISCELLANEOUS) ×3 IMPLANT
PACK CATARACT BRASINGTON LX (MISCELLANEOUS) ×3 IMPLANT
SOL BSS BAG (MISCELLANEOUS) ×3
SOLUTION BSS BAG (MISCELLANEOUS) ×1 IMPLANT
SYR 5ML LL (SYRINGE) ×3 IMPLANT
WATER STERILE IRR 250ML POUR (IV SOLUTION) ×3 IMPLANT
WIPE NON LINTING 3.25X3.25 (MISCELLANEOUS) ×3 IMPLANT

## 2017-01-12 NOTE — Anesthesia Preprocedure Evaluation (Signed)
Anesthesia Evaluation  Patient identified by MRN, date of birth, ID band Patient awake    Reviewed: Allergy & Precautions, H&P , NPO status , Patient's Chart, lab work & pertinent test results, reviewed documented beta blocker date and time   History of Anesthesia Complications (+) Family history of anesthesia reaction and history of anesthetic complications (family history of malignant hyperthermia)  Airway Mallampati: II  TM Distance: >3 FB Neck ROM: full    Dental  (+) Poor Dentition   Pulmonary neg pulmonary ROS,           Cardiovascular Exercise Tolerance: Good hypertension, (-) angina(-) CAD, (-) Past MI, (-) Cardiac Stents and (-) CABG (-) dysrhythmias (-) Valvular Problems/Murmurs     Neuro/Psych neg Seizures PSYCHIATRIC DISORDERS (Dementia) TIA Neuromuscular disease    GI/Hepatic negative GI ROS, Neg liver ROS,   Endo/Other  negative endocrine ROS  Renal/GU CRFRenal disease  negative genitourinary   Musculoskeletal   Abdominal   Peds  Hematology  (+) Blood dyscrasia, anemia ,   Anesthesia Other Findings Past Medical History: No date: Allergy No date: Anemia No date: Arthritis 11/06/2015: B12 deficiency     Comment: Less than 400 October 2016 No date: Chronic constipation No date: Dementia No date: Dementia No date: Family history of adverse reaction to anesthes*     Comment: brother, daughter and grandson had malignant               hyperthermia in the past No date: Glaucoma No date: History of fibrocystic disease of breast     Comment: biopsy done, negative No date: History of hand fracture     Comment: left 2014: History of shingles No date: History of wrist fracture     Comment: left No date: Hypertension No date: Impacted cerumen No date: Pneumonia No date: Postherpetic neuralgia Feb 2016: TIA (transient ischemic attack)   Reproductive/Obstetrics negative OB ROS                              Anesthesia Physical Anesthesia Plan  ASA: III  Anesthesia Plan: General LMA   Post-op Pain Management:    Induction:   Airway Management Planned:   Additional Equipment:   Intra-op Plan:   Post-operative Plan:   Informed Consent: I have reviewed the patients History and Physical, chart, labs and discussed the procedure including the risks, benefits and alternatives for the proposed anesthesia with the patient or authorized representative who has indicated his/her understanding and acceptance.   Dental Advisory Given  Plan Discussed with: Anesthesiologist, CRNA and Surgeon  Anesthesia Plan Comments:         Anesthesia Quick Evaluation

## 2017-01-12 NOTE — Op Note (Signed)
PREOPERATIVE DIAGNOSIS:  Nuclear sclerotic cataract of the right eye.   POSTOPERATIVE DIAGNOSIS:  NUCLEAR SCLEROTIC CATARACT RIGHT EYE   OPERATIVE PROCEDURE: Procedure(s): CATARACT EXTRACTION PHACO AND INTRAOCULAR LENS PLACEMENT (IOC)   SURGEON:  Birder Robson, MD.   ANESTHESIA:  Anesthesiologist: [name], MD CRNA: Demetrius Charity, CRNA  1.      General anesthesia care by LMA    COMPLICATIONS:  None.   TECHNIQUE:   Stop and chop   DESCRIPTION OF PROCEDURE:  The patient was examined and consented in the preoperative holding area where the aforementioned topical anesthesia was applied to the right eye and then brought back to the Operating Room where the right eye was prepped and draped in the usual sterile ophthalmic fashion and a lid speculum was placed. A paracentesis was created with the side port blade and the anterior chamber was filled with viscoelastic. A near clear corneal incision was performed with the steel keratome. A continuous curvilinear capsulorrhexis was performed with a cystotome followed by the capsulorrhexis forceps. Hydrodissection and hydrodelineation were carried out with BSS on a blunt cannula. The lens was removed in a stop and chop  technique and the remaining cortical material was removed with the irrigation-aspiration handpiece. The capsular bag was inflated with viscoelastic and the Technis [id number]  lens was placed in the capsular bag without complication. The remaining viscoelastic was removed from the eye with the irrigation-aspiration handpiece. The wounds were hydrated. The anterior chamber was flushed with Miostat and the eye was inflated to physiologic pressure. 0.23ml of Vigamox was placed in the anterior chamber. The wounds were found to be water tight. The eye was dressed with Vigamox. The patient was placed in a shield to wear throughout the day and with which to sleep tonight. The patient was also given drops with which to begin a drop regimen today  and will follow-up with me in one day.  Implant Name Type Inv. Item Serial No. Manufacturer Lot No. LRB No. Used  LENS IOL DIOP 23.5 - [id number] Intraocular Lens LENS IOL DIOP 23.5 [id number] AMO   Right 1   Procedure(s) with comments: CATARACT EXTRACTION PHACO AND INTRAOCULAR LENS PLACEMENT (IOC) (Right) - [place] 1:05.8 AP% 26.0 CDE 17.07 Fluid pack lot # [id number]  Electronically signed: [place] 01/12/2017 8:00 AM

## 2017-01-12 NOTE — Transfer of Care (Signed)
Immediate Anesthesia Transfer of Care Note  Patient: [name]  Procedure(s) Performed: Procedure(s) with comments: CATARACT EXTRACTION PHACO AND INTRAOCULAR LENS PLACEMENT ([place]) (Right) - [place] 1:05.8 AP% 26.0 CDE 17.07 Fluid pack lot # [id number]  Patient Location: PACU  Anesthesia Type:General  Level of Consciousness: sedated  Airway & Oxygen Therapy: Patient Spontanous Breathing and Patient connected to face mask oxygen  Post-op Assessment: Report given to RN and Post -op Vital signs reviewed and stable  Post vital signs: Reviewed and stable  Last Vitals:  Vitals:   01/12/17 0642  BP: (!) 158/74  Pulse: 64  Resp: 20  Temp: (!) 36.1 C    Last Pain:  Vitals:   01/12/17 0642  TempSrc: Tympanic         Complications: No apparent anesthesia complications

## 2017-01-12 NOTE — Discharge Instructions (Signed)

## 2017-01-12 NOTE — Anesthesia Post-op Follow-up Note (Cosign Needed)
Anesthesia QCDR form completed.        

## 2017-01-12 NOTE — H&P (Signed)
All labs reviewed. Abnormal studies sent to patients PCP when indicated.  Previous H&P reviewed, patient examined, there are NO CHANGES.  Melissa Fredricksen LOUIS5/15/20187:19 AM

## 2017-01-12 NOTE — Anesthesia Procedure Notes (Signed)
Procedure Name: LMA Insertion Performed by: Demetrius Charity Pre-anesthesia Checklist: Patient identified, Patient being monitored, Timeout performed, Emergency Drugs available and Suction available Patient Re-evaluated:Patient Re-evaluated prior to inductionOxygen Delivery Method: Circle system utilized Preoxygenation: Pre-oxygenation with 100% oxygen Intubation Type: IV induction Ventilation: Mask ventilation without difficulty LMA: LMA inserted LMA Size: 3.5 Tube type: Oral Number of attempts: 1 Placement Confirmation: positive ETCO2 and breath sounds checked- equal and bilateral Tube secured with: Tape Dental Injury: Teeth and Oropharynx as per pre-operative assessment

## 2017-01-12 NOTE — Anesthesia Postprocedure Evaluation (Signed)
Anesthesia Post Note  Patient: Melissa Combs  Procedure(s) Performed: Procedure(s) (LRB): CATARACT EXTRACTION PHACO AND INTRAOCULAR LENS PLACEMENT ([facility]) (Right)  Patient location during evaluation: PACU Anesthesia Type: General Level of consciousness: awake and alert Pain management: pain level controlled Vital Signs Assessment: post-procedure vital signs reviewed and stable Respiratory status: spontaneous breathing, nonlabored ventilation, respiratory function stable and patient connected to nasal cannula oxygen Cardiovascular status: blood pressure returned to baseline and stable Postop Assessment: no signs of nausea or vomiting Anesthetic complications: no     Last Vitals:  Vitals:   01/12/17 0836 01/12/17 0849  BP: (!) 142/84   Pulse: (!) 54 (!) 50  Resp: 16 16  Temp: 36.9 C 36.8 C    Last Pain:  Vitals:   01/12/17 0849  TempSrc: Oral  PainSc:                  [name]

## 2017-03-10 ENCOUNTER — Other Ambulatory Visit: Payer: Self-pay | Admitting: Family Medicine

## 2017-03-11 ENCOUNTER — Other Ambulatory Visit: Payer: Self-pay

## 2017-03-11 DIAGNOSIS — E785 Hyperlipidemia, unspecified: Secondary | ICD-10-CM

## 2017-03-11 DIAGNOSIS — Z5181 Encounter for therapeutic drug level monitoring: Secondary | ICD-10-CM

## 2017-03-11 MED ORDER — PRAVASTATIN SODIUM 20 MG PO TABS: 20.0000 mg | ORAL_TABLET | Freq: Every day | ORAL | 0 refills | Status: DC

## 2017-03-11 NOTE — Telephone Encounter (Signed)
Home notified.

## 2017-03-11 NOTE — Progress Notes (Unsigned)
Alt

## 2017-03-11 NOTE — Telephone Encounter (Signed)
Patient is well overdue for recheck of her fasting lipids and sgpt Please have her come in for fasting labs asap

## 2017-03-12 DIAGNOSIS — E785 Hyperlipidemia, unspecified: Secondary | ICD-10-CM | POA: Diagnosis not present

## 2017-03-12 DIAGNOSIS — Z5181 Encounter for therapeutic drug level monitoring: Secondary | ICD-10-CM | POA: Diagnosis not present

## 2017-03-12 LAB — LIPID PANEL
Cholesterol: 202 mg/dL — ABNORMAL HIGH
HDL: 71 mg/dL
LDL Cholesterol: 118 mg/dL — ABNORMAL HIGH
Total CHOL/HDL Ratio: 2.8 ratio
Triglycerides: 63 mg/dL
VLDL: 13 mg/dL

## 2017-03-12 LAB — ALT: ALT: 3 U/L — ABNORMAL LOW (ref 6–29)

## 2017-03-13 ENCOUNTER — Other Ambulatory Visit: Payer: Self-pay | Admitting: Family Medicine

## 2017-03-13 MED ORDER — PRAVASTATIN SODIUM 20 MG PO TABS: 20.0000 mg | ORAL_TABLET | Freq: Every day | ORAL | 6 refills | Status: DC

## 2017-03-13 NOTE — Progress Notes (Signed)
Labs reviewed; statin refilled

## 2017-03-25 ENCOUNTER — Other Ambulatory Visit: Payer: Self-pay | Admitting: Family Medicine

## 2017-03-25 NOTE — Telephone Encounter (Signed)
Last K+ and Cr reviewed; Rx approved 

## 2017-04-05 ENCOUNTER — Other Ambulatory Visit: Payer: Self-pay | Admitting: Family Medicine

## 2017-04-29 ENCOUNTER — Encounter: Payer: Self-pay | Admitting: Family Medicine

## 2017-04-29 ENCOUNTER — Ambulatory Visit (INDEPENDENT_AMBULATORY_CARE_PROVIDER_SITE_OTHER): Payer: Medicare Other | Admitting: Family Medicine

## 2017-04-29 VITALS — BP 138/82 | HR 71 | Temp 98.7°F | Resp 16 | Wt 131.4 lb

## 2017-04-29 DIAGNOSIS — G459 Transient cerebral ischemic attack, unspecified: Secondary | ICD-10-CM | POA: Diagnosis not present

## 2017-04-29 DIAGNOSIS — N183 Chronic kidney disease, stage 3 unspecified: Secondary | ICD-10-CM

## 2017-04-29 DIAGNOSIS — F0390 Unspecified dementia without behavioral disturbance: Secondary | ICD-10-CM

## 2017-04-29 DIAGNOSIS — Z23 Encounter for immunization: Secondary | ICD-10-CM | POA: Diagnosis not present

## 2017-04-29 DIAGNOSIS — D631 Anemia in chronic kidney disease: Secondary | ICD-10-CM | POA: Diagnosis not present

## 2017-04-29 DIAGNOSIS — I10 Essential (primary) hypertension: Secondary | ICD-10-CM | POA: Diagnosis not present

## 2017-04-29 DIAGNOSIS — B0229 Other postherpetic nervous system involvement: Secondary | ICD-10-CM | POA: Diagnosis not present

## 2017-04-29 DIAGNOSIS — E871 Hypo-osmolality and hyponatremia: Secondary | ICD-10-CM | POA: Diagnosis not present

## 2017-04-29 DIAGNOSIS — E785 Hyperlipidemia, unspecified: Secondary | ICD-10-CM | POA: Diagnosis not present

## 2017-04-29 DIAGNOSIS — J309 Allergic rhinitis, unspecified: Secondary | ICD-10-CM | POA: Diagnosis not present

## 2017-04-29 LAB — CBC WITH DIFFERENTIAL/PLATELET
BASOS ABS: 0 {cells}/uL (ref 0–200)
BASOS PCT: 0 %
EOS ABS: 98 {cells}/uL (ref 15–500)
Eosinophils Relative: 1 %
HEMATOCRIT: 34.1 % — AB (ref 35.0–45.0)
Hemoglobin: 10.8 g/dL — ABNORMAL LOW (ref 11.7–15.5)
Lymphocytes Relative: 24 %
Lymphs Abs: 2352 cells/uL (ref 850–3900)
MCH: 29.2 pg (ref 27.0–33.0)
MCHC: 31.7 g/dL — ABNORMAL LOW (ref 32.0–36.0)
MCV: 92.2 fL (ref 80.0–100.0)
MONO ABS: 784 {cells}/uL (ref 200–950)
MONOS PCT: 8 %
MPV: 9.4 fL (ref 7.5–12.5)
NEUTROS ABS: 6566 {cells}/uL (ref 1500–7800)
Neutrophils Relative %: 67 %
PLATELETS: 264 10*3/uL (ref 140–400)
RBC: 3.7 MIL/uL — ABNORMAL LOW (ref 3.80–5.10)
RDW: 13.4 % (ref 11.0–15.0)
WBC: 9.8 10*3/uL (ref 3.8–10.8)

## 2017-04-29 NOTE — Progress Notes (Signed)
BP 138/82   Pulse 71   Temp 98.7 F (37.1 C) (Oral)   Resp 16   Wt 131 lb 6.4 oz (59.6 kg)   SpO2 99%   BMI 22.55 kg/m    Subjective:    Patient ID: Melissa Combs, female    DOB: 03/17/1930, 83 y.o.   MRN: [id number]  HPI: Melissa Combs is a 83 y.o. female  Chief Complaint  Patient presents with  . Follow-up    labs   HPI She is here for follow-up Here with Elie Confer, director of group home; no behavior concerns Good eater; chopped foods (mechanical) Sleeps well Nothing new to report that she needs to urinate; Elie Confer has noticed she is tugging on her pull-ups No foul odor or visible blood in the urine Vitals are good, once a month Post-herpetic neuralgia; complains to staff that her head hurts, eye hurts; saw eye doctor; he couldn't do anything; patient used to see pain clinic, apparently not going any more; I believe her pain doctor moved away TIA; no new neurologic deficits Dementia is slowly progressive Allergies; no pets regularly in the home; no issues with sniffling Last cholesterol July, LDL improved from 161 to 118 on statin Brother or cousin is her [place], lives in [place]; not sure about her code status  Depression screen [facility] 2/9 04/29/2017 12/31/2016 09/22/2016 09/02/2016 08/27/2015  Decreased Interest 0 0 0 1 0  Down, Depressed, Hopeless 0 0 0 1 0  PHQ - 2 Score 0 0 0 2 0  Altered sleeping - - - 1 -  Tired, decreased energy - - - 0 -  Change in appetite - - - 1 -  Feeling bad or failure about yourself  - - - 1 -  Trouble concentrating - - - 0 -  Moving slowly or fidgety/restless - - - 0 -  Suicidal thoughts - - - 0 -  PHQ-9 Score - - - 5 -    Relevant past medical, surgical, family and social history reviewed Past Medical History:  Diagnosis Date  . Allergy   . Anemia   . Arthritis   . B12 deficiency 11/06/2015   Less than 400 October 2016  . Chronic constipation   . Dementia   . Dementia   . Family history of adverse reaction to anesthesia    brother, daughter and grandson had malignant hyperthermia in the past  . Glaucoma   . History of fibrocystic disease of breast    biopsy done, negative  . History of hand fracture    left  . History of shingles 2014  . History of wrist fracture    left  . Hypertension   . Impacted cerumen   . Pneumonia   . Postherpetic neuralgia   . TIA (transient ischemic attack) Feb 2016   Past Surgical History:  Procedure Laterality Date  . BREAST LUMPECTOMY     unsure which side  . CATARACT EXTRACTION W/PHACO Right 01/12/2017   Procedure: CATARACT EXTRACTION PHACO AND INTRAOCULAR LENS PLACEMENT ([facility]);  Surgeon: Birder Robson, MD;  Location: [facility] ORS;  Service: Ophthalmology;  Laterality: Right;  [place] 1:05.8 AP% 26.0 CDE 17.07 Fluid pack lot # [id number]   Family History  Problem Relation Age of Onset  . Family history unknown: Yes   Social History   Social History  . Marital status: Single    Spouse name: N/A  . Number of children: N/A  . Years of education: N/A   Occupational History  . Not on  file.   Social History Main Topics  . Smoking status: Never Smoker  . Smokeless tobacco: Never Used  . Alcohol use No  . Drug use: No  . Sexual activity: No   Other Topics Concern  . Not on file   Social History Narrative  . No narrative on file    Interim medical history since last visit reviewed. Allergies and medications reviewed  Review of Systems Per HPI unless specifically indicated above     Objective:    BP 138/82   Pulse 71   Temp 98.7 F (37.1 C) (Oral)   Resp 16   Wt 131 lb 6.4 oz (59.6 kg)   SpO2 99%   BMI 22.55 kg/m   Wt Readings from Last 3 Encounters:  04/29/17 131 lb 6.4 oz (59.6 kg)  01/12/17 139 lb (63 kg)  12/31/16 139 lb 11.2 oz (63.4 kg)    Physical Exam  Constitutional: She appears well-developed and well-nourished. No distress.  Eyes: Right conjunctiva is not injected. Left conjunctiva is not injected. No scleral icterus.  Cardiovascular:  Normal rate and regular rhythm.   Pulmonary/Chest: Effort normal. She has no wheezes.  Genitourinary:  Genitourinary Comments: No external irritation or discharge or erythema or bruising along the upper thighs, groin, labia  Musculoskeletal:  Wearing compression stockings  Neurological: She is alert.  Pleasantly demented  Skin: No pallor.  Psychiatric: Her mood appears not anxious. Her speech is not delayed and not slurred. She is not agitated, not aggressive and not combative. Cognition and memory are impaired. She does not exhibit a depressed mood.  Very pleasant, demented, answers questions; poor historian; when not directly being addressed, sat quietly in her chair, disengaged   Results for orders placed or performed in visit on 03/11/17  ALT  Result Value Ref Range   ALT 3 (L) 6 - 29 U/L  Lipid Profile  Result Value Ref Range   Cholesterol 202 (H) <200 mg/dL   Triglycerides 63 <150 mg/dL   HDL 71 >50 mg/dL   Total CHOL/HDL Ratio 2.8 <5.0 Ratio   VLDL 13 <30 mg/dL   LDL Cholesterol 118 (H) <100 mg/dL      Assessment & Plan:   Problem List Items Addressed This Visit      Cardiovascular and Mediastinum   TIA (transient ischemic attack)    No new neurologic deficits      Hypertension - Primary    Well-controlled        Respiratory   Allergic rhinitis    controlled        Nervous and Auditory   Post herpetic neuralgia head    No longer seeing pain clinic; re-refer to pain clinic because patient still complaining of pain      Relevant Orders   Ambulatory referral to Pain Clinic   Dementia    Slowly progressive        Genitourinary   CKD (chronic kidney disease) stage 3, GFR 30-59 ml/min    Check cr      Relevant Orders   Basic metabolic panel     Other   Hyponatremia    Check BMP      Relevant Orders   Basic metabolic panel   Dyslipidemia    Improved; avoid saturate fats      Anemia    Chronic disease; check labs      Relevant Orders    CBC with Differential/Platelet    Other Visit Diagnoses    Needs flu shot  Relevant Orders   Flu vaccine HIGH DOSE PF (Fluzone High dose) (Completed)       Follow up plan: Return in about 4 months (around 08/29/2017).  An after-visit summary was printed and given to the patient at [place].  Please see the patient instructions which may contain other information and recommendations beyond what is mentioned above in the assessment and plan.  No orders of the defined types were placed in this encounter.   Orders Placed This Encounter  Procedures  . Flu vaccine HIGH DOSE PF (Fluzone High dose)  . CBC with Differential/Platelet  . Basic metabolic panel  . Ambulatory referral to Pain Clinic

## 2017-04-29 NOTE — Assessment & Plan Note (Signed)
Check BMP .  

## 2017-04-29 NOTE — Assessment & Plan Note (Signed)
Check cr

## 2017-04-29 NOTE — Patient Instructions (Addendum)
Please get [place] the information about her health care power of attorney, as we would love to have that on file and have a chance to talk with that person about her "code status" We'll get labs today

## 2017-04-29 NOTE — Assessment & Plan Note (Signed)
Slowly progressive

## 2017-04-29 NOTE — Assessment & Plan Note (Signed)
No new neurologic deficits

## 2017-04-29 NOTE — Assessment & Plan Note (Signed)
Chronic disease; check labs

## 2017-04-29 NOTE — Assessment & Plan Note (Signed)
controlled 

## 2017-04-29 NOTE — Assessment & Plan Note (Signed)
Improved; avoid saturate fats

## 2017-04-29 NOTE — Assessment & Plan Note (Signed)
Well controlled 

## 2017-04-29 NOTE — Assessment & Plan Note (Addendum)
No longer seeing pain clinic; re-refer to pain clinic because patient still complaining of pain

## 2017-04-30 ENCOUNTER — Telehealth: Payer: Self-pay | Admitting: Family Medicine

## 2017-04-30 LAB — BASIC METABOLIC PANEL
BUN: 15 mg/dL (ref 7–25)
CALCIUM: 9.4 mg/dL (ref 8.6–10.4)
CHLORIDE: 95 mmol/L — AB (ref 98–110)
CO2: 30 mmol/L (ref 20–32)
CREATININE: 0.71 mg/dL (ref 0.60–0.88)
Glucose, Bld: 78 mg/dL (ref 65–99)
Potassium: 4.2 mmol/L (ref 3.5–5.3)
Sodium: 139 mmol/L (ref 135–146)

## 2017-04-30 NOTE — Telephone Encounter (Signed)
done

## 2017-04-30 NOTE — Telephone Encounter (Signed)
Joy from [employer] states she spoke with you earlier and is asking for you to fax the lab results so she can put them in the patient chart. Please fax to [phone]

## 2017-05-26 DIAGNOSIS — G459 Transient cerebral ischemic attack, unspecified: Secondary | ICD-10-CM | POA: Diagnosis not present

## 2017-05-26 DIAGNOSIS — F0390 Unspecified dementia without behavioral disturbance: Secondary | ICD-10-CM | POA: Diagnosis not present

## 2017-06-08 ENCOUNTER — Ambulatory Visit: Payer: Medicare Other | Admitting: Student in an Organized Health Care Education/Training Program

## 2017-06-09 ENCOUNTER — Ambulatory Visit: Payer: Medicare Other | Admitting: Family Medicine

## 2017-06-15 ENCOUNTER — Ambulatory Visit: Payer: Medicare Other | Admitting: Student in an Organized Health Care Education/Training Program

## 2017-06-24 ENCOUNTER — Other Ambulatory Visit: Payer: Self-pay | Admitting: Family Medicine

## 2017-06-24 NOTE — Telephone Encounter (Signed)
Last Cr and K+ reviewed; lisinopril refilled I do not prescribe her Namenda; please ask the pharmacist to send that request to Dr. Melrose Nakayama, her neurologist Thank you

## 2017-06-30 ENCOUNTER — Ambulatory Visit: Payer: Medicare Other | Admitting: Student in an Organized Health Care Education/Training Program

## 2017-07-08 ENCOUNTER — Encounter: Payer: Self-pay | Admitting: Student in an Organized Health Care Education/Training Program

## 2017-07-08 ENCOUNTER — Ambulatory Visit
Payer: Medicare Other | Attending: Student in an Organized Health Care Education/Training Program | Admitting: Student in an Organized Health Care Education/Training Program

## 2017-07-08 VITALS — BP 147/70 | HR 57 | Temp 97.8°F | Resp 16 | Wt 150.0 lb

## 2017-07-08 DIAGNOSIS — G894 Chronic pain syndrome: Secondary | ICD-10-CM | POA: Diagnosis not present

## 2017-07-08 DIAGNOSIS — I129 Hypertensive chronic kidney disease with stage 1 through stage 4 chronic kidney disease, or unspecified chronic kidney disease: Secondary | ICD-10-CM | POA: Diagnosis not present

## 2017-07-08 DIAGNOSIS — E538 Deficiency of other specified B group vitamins: Secondary | ICD-10-CM | POA: Insufficient documentation

## 2017-07-08 DIAGNOSIS — D649 Anemia, unspecified: Secondary | ICD-10-CM | POA: Insufficient documentation

## 2017-07-08 DIAGNOSIS — K5909 Other constipation: Secondary | ICD-10-CM | POA: Diagnosis not present

## 2017-07-08 DIAGNOSIS — R51 Headache: Secondary | ICD-10-CM | POA: Insufficient documentation

## 2017-07-08 DIAGNOSIS — M170 Bilateral primary osteoarthritis of knee: Secondary | ICD-10-CM | POA: Insufficient documentation

## 2017-07-08 DIAGNOSIS — B0229 Other postherpetic nervous system involvement: Secondary | ICD-10-CM | POA: Diagnosis not present

## 2017-07-08 DIAGNOSIS — E785 Hyperlipidemia, unspecified: Secondary | ICD-10-CM | POA: Insufficient documentation

## 2017-07-08 DIAGNOSIS — Z8673 Personal history of transient ischemic attack (TIA), and cerebral infarction without residual deficits: Secondary | ICD-10-CM | POA: Insufficient documentation

## 2017-07-08 DIAGNOSIS — N183 Chronic kidney disease, stage 3 (moderate): Secondary | ICD-10-CM | POA: Insufficient documentation

## 2017-07-08 DIAGNOSIS — Z79899 Other long term (current) drug therapy: Secondary | ICD-10-CM | POA: Diagnosis not present

## 2017-07-08 DIAGNOSIS — F0390 Unspecified dementia without behavioral disturbance: Secondary | ICD-10-CM | POA: Diagnosis not present

## 2017-07-08 DIAGNOSIS — M5481 Occipital neuralgia: Secondary | ICD-10-CM

## 2017-07-08 DIAGNOSIS — M5136 Other intervertebral disc degeneration, lumbar region: Secondary | ICD-10-CM | POA: Diagnosis not present

## 2017-07-08 DIAGNOSIS — M25562 Pain in left knee: Secondary | ICD-10-CM | POA: Insufficient documentation

## 2017-07-08 DIAGNOSIS — Z7982 Long term (current) use of aspirin: Secondary | ICD-10-CM | POA: Diagnosis not present

## 2017-07-08 DIAGNOSIS — M25561 Pain in right knee: Secondary | ICD-10-CM | POA: Insufficient documentation

## 2017-07-08 MED ORDER — PREGABALIN 25 MG PO CAPS: 25.0000 mg | ORAL_CAPSULE | Freq: Every day | ORAL | 1 refills | Status: DC

## 2017-07-08 NOTE — Progress Notes (Signed)
Safety precautions to be maintained throughout the outpatient stay will include: orient to surroundings, keep bed in low position, maintain call bell within reach at all times, provide assistance with transfer out of bed and ambulation.  

## 2017-07-08 NOTE — Patient Instructions (Addendum)
1.  Start Lyrica 25 mg nightly. 2.  Discontinue gabapentin. 3.  Discuss knee injections with patient's daughter, we will discuss at next visit. 4.  Follow-up in approximately 4 weeks.  4 weeks medication

## 2017-07-09 NOTE — Progress Notes (Signed)
Patient's Name: Melissa Combs  MRN: [id number]  Referring Provider: [name], MD  DOB: 1930-08-20  PCP: [name], MD  DOS: 07/08/2017  Note by: Gillis Santa, MD  Service setting: Ambulatory outpatient  Specialty: Interventional Pain Management  Location: [facility] (AMB) Pain Management Facility  Visit type: Initial Patient Evaluation  Patient type: New Patient   Primary Reason(s) for Visit: Encounter for initial evaluation of one or more chronic problems (new to examiner) potentially causing chronic pain, and posing a threat to normal musculoskeletal function. (Level of risk: High) CC: Headache (left temple area) and Knee Pain (bilateral)  HPI  Ms. Melissa Combs is a 83 y.o. year old, female patient, who comes today to see [place] for the first time for an initial evaluation of her chronic pain. She has Post herpetic neuralgia head; Bilateral occipital neuralgia; DDD (degenerative disc disease), lumbar; DJD (degenerative joint disease) of knee; Glaucoma; Hypertension; Dementia; TIA (transient ischemic attack); Anemia; Arthritis; Chronic constipation; Postherpetic neuralgia; CKD (chronic kidney disease) stage 3, GFR 30-59 ml/min (HCC); Dyslipidemia; Medication monitoring encounter; Bilateral leg edema; Hypochloremia; Hyponatremia; Osteoporosis screening; B12 deficiency; Allergic rhinitis; Urinary incontinence; and Chronic pain syndrome on their problem list. Today she comes in for evaluation of her Headache (left temple area) and Knee Pain (bilateral)  Patient with significant dementia- most of HPI obtained from caregiver who is with patient majority of the time.  Pain Assessment: Location: Left Head and bilateral knees Radiating: (patient unable to answer question) Onset:  years Duration:  persistent Quality: (patient unable to answer question)- burning Severity: (unable to assess)/10 (self-reported pain score)  Note: Unable to assess                         When using our objective Pain Scale,  levels between 6 and 10/10 are said to belong in an emergency room, as it progressively worsens from a 6/10, described as severely limiting, requiring emergency care not usually available at an outpatient pain management facility. At a 6/10 level, communication becomes difficult and requires great effort. Assistance to reach the emergency department may be required. Facial flushing and profuse sweating along with potentially dangerous increases in heart rate and blood pressure will be evident. Effect on ADL:  limits  Timing:  not effected Modifying factors:    Onset and Duration: Gradual Cause of pain: Arthritis Severity: Getting worse Timing: Not influenced by the time of the day Aggravating Factors: Motion Alleviating Factors: Resting Associated Problems: Fatigue, Sadness, Tingling, Weakness and Pain that does not allow patient to sleep Quality of Pain: Disabling, Distressing, Shooting, Stabbing and Tingling Previous Examinations or Tests: CT scan, X-rays, Neurological evaluation and Orthopedic evaluation Previous Treatments: Narcotic medications, Pool exercises, Steroid treatments by mouth, Strengthening exercises, Stretching exercises and TENS  The patient comes into the clinics today for the first time for a chronic pain management evaluation.   HPI obtained from caregiver- mainly left occipital pain secondary to occipital neuralgia.  Bilateral knee pain 2/2 to knee OA  Today I took the time to provide the patient with information regarding my pain practice. The patient was informed that my practice is divided into two sections: an interventional pain management section, as well as a completely separate and distinct medication management section. I explained that I have procedure days for my interventional therapies, and evaluation days for follow-ups and medication management. Because of the amount of documentation required during both, they are kept separated. This means that there is  the  possibility that she may be scheduled for a procedure on one day, and medication management the next. I have also informed her that because of staffing and facility limitations, I no longer take patients for medication management only. To illustrate the reasons for this, I gave the patient the example of surgeons, and how inappropriate it would be to refer a patient to his/her care, just to write for the post-surgical antibiotics on a surgery done by a different surgeon.   Because interventional pain management is my board-certified specialty, the patient was informed that joining my practice means that they are open to any and all interventional therapies. I made it clear that this does not mean that they will be forced to have any procedures done. What this means is that I believe interventional therapies to be essential part of the diagnosis and proper management of chronic pain conditions. Therefore, patients not interested in these interventional alternatives will be better served under the care of a different practitioner.  The patient was also made aware of my Comprehensive Pain Management Safety Guidelines where by joining my practice, they limit all of their nerve blocks and joint injections to those done by our practice, for as long as we are retained to manage their care.   Historic Controlled Substance Pharmacotherapy Review    Pharmacologic Plan: Further information needed.            Initial impression: Poor candidate for opioid analgesics.  Meds   Current Outpatient Medications:  .  amLODipine (NORVASC) 2.5 MG tablet, TAKE 1 AND 1/2 TABLETS (3.75MG) BY MOUTH EVERY DAY, Disp: 45 tablet, Rfl: 11 .  aspirin 81 MG chewable tablet, TAKE 1 TABLET BY MOUTH ONCE DAILY., Disp: 30 tablet, Rfl: 11 .  bismuth subsalicylate (PEPTO BISMOL) 262 MG/15ML suspension, Take 30 mLs by mouth every 6 (six) hours as needed., Disp: , Rfl:  .  brimonidine-timolol (COMBIGAN) 0.2-0.5 % ophthalmic solution,  Place 1 drop into both eyes 2 (two) times daily., Disp: , Rfl:  .  carvedilol (COREG) 6.25 MG tablet, TAKE ONE TABLET BY MOUTH TWO TIMES A DAY, Disp: 60 tablet, Rfl: 11 .  citalopram (CELEXA) 10 MG tablet, Take 1 tablet (10 mg total) by mouth daily., Disp: 30 tablet, Rfl: 11 .  Diapers & Supplies (HUGGIES PULL-UPS) MISC, 1 each by Does not apply route 3 (three) times daily. Adult size medium, urinary and stool incontinence, LON 99 months, Disp: 100 each, Rfl: 5 .  diclofenac sodium (VOLTAREN) 1 % GEL, Apply 2 grams to each knee three times a day while awake, for example, 8 am, 2 pm, and 8 pm, Disp: 100 g, Rfl: 5 .  Difluprednate (DUREZOL) 0.05 % EMUL, Apply 1 drop to eye. Right eye, Disp: , Rfl:  .  divalproex (DEPAKOTE SPRINKLES) 125 MG capsule, Take 1 capsule (125 mg total) by mouth 2 (two) times daily., Disp: , Rfl:  .  donepezil (ARICEPT) 10 MG tablet, Take 1 tablet (10 mg total) by mouth at bedtime., Disp: 30 tablet, Rfl: 11 .  fluticasone (FLONASE) 50 MCG/ACT nasal spray, USE 2 SPRAYS IN BOTH NOSTRILS ONCE DAILYFOR RHINITIS, Disp: 16 g, Rfl: 11 .  GNP VITAMIN B-12 500 MCG tablet, TAKE 1 TABLET BY MOUTH ONCE DAILY FOR SUPPLEMENT, Disp: 30 tablet, Rfl: 12 .  latanoprost (XALATAN) 0.005 % ophthalmic solution, Place 1 drop into both eyes at bedtime., Disp: , Rfl:  .  lisinopril (PRINIVIL,ZESTRIL) 20 MG tablet, TAKE ONE TABLET BY MOUTH EVERY DAY, Disp: 30 tablet,  Rfl: 5 .  montelukast (SINGULAIR) 5 MG chewable tablet, CHEW 1 TABLET BY MOUTH AT BEDTIME, Disp: 30 tablet, Rfl: 11 .  NAMENDA XR 28 MG CP24 24 hr capsule, TAKE 1 CAPSULE BY MOUTH ONCE DAILY FOR MEMORY, Disp: 30 capsule, Rfl: 11 .  Polyethyl Glycol-Propyl Glycol (SYSTANE) 0.4-0.3 % SOLN, Apply 1 drop to eye 4 (four) times daily., Disp: , Rfl:  .  polyethylene glycol powder (GLYCOLAX/MIRALAX) powder, MIX 17 GR (1 CAPFUL) IN 8 OZ WATER/JUICEAND DRINK ONCE DAILY AS NEEDED TO FOR CONSTIPATION, Disp: 527 g, Rfl: 11 .  pravastatin (PRAVACHOL) 20  MG tablet, Take 1 tablet (20 mg total) by mouth at bedtime., Disp: 30 tablet, Rfl: 6 .  pregabalin (LYRICA) 25 MG capsule, Take 1 capsule (25 mg total) at bedtime by mouth., Disp: 30 capsule, Rfl: 1  Imaging Review    Knee-R DG 4 views:  Results for orders placed during the hospital encounter of 03/01/16  DG Knee Complete 4 Views Right   Narrative CLINICAL DATA:  Right knee pain and swelling.  EXAM: RIGHT KNEE - COMPLETE 4+ VIEW  COMPARISON:  01/06/2011  FINDINGS: No evidence of acute fracture or dislocation.  Moderate tricompartmental osteoarthritis is again demonstrated as well as patellar enthesopathy. A small knee joint effusion is present but decreased in size compared to previous study.  IMPRESSION: Moderate tricompartmental osteoarthritis and small knee joint effusion. No evidence of fracture.   Electronically Signed   By: Earle Gell M.D.   On: 03/01/2016 15:42     Complexity Note: Imaging results reviewed. Results shared with Ms. Spanbauer, using Layman's terms.                         ROS  Cardiovascular History: High blood pressure Pulmonary or Respiratory History: Shortness of breath Neurological History: Stroke (Residual deficits or weakness: cognitive dysfunction) Review of Past Neurological Studies: No results found for this or any previous visit. Psychological-Psychiatric History: Depressed Gastrointestinal History: Heartburn due to stomach pushing into lungs (Hiatal hernia) Genitourinary History: Kidney disease Hematological History: Brusing easily Endocrine History: No reported endocrine signs or symptoms such as high or low blood sugar, rapid heart rate due to high thyroid levels, obesity or weight gain due to slow thyroid or thyroid disease Rheumatologic History: Joint aches and or swelling due to excess weight (Osteoarthritis) Musculoskeletal History: Negative for myasthenia gravis, muscular dystrophy, multiple sclerosis or malignant  hyperthermia Work History: Retired  Allergies  Ms. Vanderschaaf has No Known Allergies.  Laboratory Chemistry  Inflammation Markers (CRP: Acute Phase) (ESR: Chronic Phase) No results found for: CRP, ESRSEDRATE               Renal Function Markers Lab Results  Component Value Date   BUN 15 04/29/2017   CREATININE 0.71 04/29/2017   GFRAA 71 09/02/2016   GFRNONAA 61 09/02/2016                 Hepatic Function Markers Lab Results  Component Value Date   AST 14 09/02/2016   ALT 3 (L) 03/11/2017   ALBUMIN 4.1 09/02/2016   ALKPHOS 47 09/02/2016                 Electrolytes Lab Results  Component Value Date   NA 139 04/29/2017   K 4.2 04/29/2017   CL 95 (L) 04/29/2017   CALCIUM 9.4 04/29/2017                 Neuropathy Markers  Lab Results  Component Value Date   VITAMINB12 1,036 09/02/2016                 Bone Pathology Markers Lab Results  Component Value Date   JEHUDJS 97 09/02/2016   VD25OH 40.7 06/27/2015   CALCIUM 9.4 04/29/2017                 Rheumatology Markers No results found for: [facility]              Coagulation Parameters Lab Results  Component Value Date   PLT 264 04/29/2017                 Cardiovascular Markers Lab Results  Component Value Date   TROPONINI < 0.02 02/01/2013   HGB 10.8 (L) 04/29/2017   HCT 34.1 (L) 04/29/2017                 CA Markers No results found for: CEA, CA125, LABCA2               Note: Lab results reviewed.  PFSH  Drug: Ms. Mattie  reports that she does not use drugs. Alcohol:  reports that she does not drink alcohol. Tobacco:  reports that  has never smoked. she has never used smokeless tobacco. Medical:  has a past medical history of Allergy, Anemia, Arthritis, B12 deficiency (11/06/2015), Chronic constipation, Dementia, Dementia, Family history of adverse reaction to anesthesia, Glaucoma, History of fibrocystic disease of breast, History of hand fracture, History of shingles (2014), History of wrist fracture,  Hypertension, Impacted cerumen, Pneumonia, Postherpetic neuralgia, and TIA (transient ischemic attack) (Feb 2016). Family: Family history is unknown by patient.  Past Surgical History:  Procedure Laterality Date  . BREAST LUMPECTOMY     unsure which side   Active Ambulatory Problems    Diagnosis Date Noted  . Post herpetic neuralgia head 01/13/2015  . Bilateral occipital neuralgia 01/13/2015  . DDD (degenerative disc disease), lumbar 01/13/2015  . DJD (degenerative joint disease) of knee 01/13/2015  . Glaucoma   . Hypertension   . Dementia   . TIA (transient ischemic attack) 10/01/2014  . Anemia   . Arthritis   . Chronic constipation   . Postherpetic neuralgia   . CKD (chronic kidney disease) stage 3, GFR 30-59 ml/min (HCC) 03/28/2015  . Dyslipidemia 06/27/2015  . Medication monitoring encounter 09/27/2015  . Bilateral leg edema 09/27/2015  . Hypochloremia 09/29/2015  . Hyponatremia 09/29/2015  . Osteoporosis screening 11/01/2015  . B12 deficiency 11/06/2015  . Allergic rhinitis 11/29/2015  . Urinary incontinence 10/03/2016  . Chronic pain syndrome 07/08/2017   Resolved Ambulatory Problems    Diagnosis Date Noted  . Allergy    Past Medical History:  Diagnosis Date  . Allergy   . Anemia   . Arthritis   . B12 deficiency 11/06/2015  . Chronic constipation   . Dementia   . Dementia   . Family history of adverse reaction to anesthesia   . Glaucoma   . History of fibrocystic disease of breast   . History of hand fracture   . History of shingles 2014  . History of wrist fracture   . Hypertension   . Impacted cerumen   . Pneumonia   . Postherpetic neuralgia   . TIA (transient ischemic attack) Feb 2016   Constitutional Exam  General appearance: slowed mentation, disoriented and in no distress Vitals:   07/08/17 1122  BP: (!) 147/70  Pulse: (!) 57  Resp: 16  Temp: 97.8 F (36.6 C)  TempSrc: Oral  SpO2: 100%  Weight: 150 lb (68 kg)   BMI Assessment:  Estimated body mass index is 25.75 kg/m as calculated from the following:   Height as of 01/12/17: 5' 4"  (1.626 m).   Weight as of this encounter: 150 lb (68 kg).  BMI interpretation table: BMI level Category Range association with higher incidence of chronic pain  <18 kg/m2 Underweight   18.5-24.9 kg/m2 Ideal body weight   25-29.9 kg/m2 Overweight Increased incidence by 20%  30-34.9 kg/m2 Obese (Class I) Increased incidence by 68%  35-39.9 kg/m2 Severe obesity (Class II) Increased incidence by 136%  >40 kg/m2 Extreme obesity (Class III) Increased incidence by 254%   BMI Readings from Last 4 Encounters:  07/08/17 25.75 kg/m  04/29/17 22.55 kg/m  01/12/17 23.86 kg/m  12/31/16 23.98 kg/m   Wt Readings from Last 4 Encounters:  07/08/17 150 lb (68 kg)  04/29/17 131 lb 6.4 oz (59.6 kg)  01/12/17 139 lb (63 kg)  12/31/16 139 lb 11.2 oz (63.4 kg)  Psych/Mental status: not orientated to person, place, or time       Eyes: PERLA Respiratory: No evidence of acute respiratory distress  Cervical Spine Area Exam  Skin & Axial Inspection: No masses, redness, edema, swelling, or associated skin lesions Alignment: Asymmetric Functional ROM: Decreased ROM      Stability: No instability detected Muscle Tone/Strength: Functionally intact. No obvious neuro-muscular anomalies detected. Sensory (Neurological): Articular pain pattern Palpation: Trigger Point              Upper Extremity (UE) Exam    Side: Right upper extremity  Side: Left upper extremity  Skin & Extremity Inspection: Atrophy  Skin & Extremity Inspection: Atrophy  Functional ROM: Decreased ROM for all joints of upper extremity  Functional ROM: Decreased ROM          Muscle Tone/Strength: Movement possible against some resistance (4/5)  Muscle Tone/Strength: Movement possible against some resistance (4/5)  Sensory (Neurological): Movement-associated pain          Sensory (Neurological): Movement-associated pain           Palpation: No palpable anomalies              Palpation: No palpable anomalies              Specialized Test(s): Deferred         Specialized Test(s): Deferred           Lumbar Spine Area Exam  Skin & Axial Inspection: No masses, redness, or swelling Alignment: Scoliosis detected Functional ROM: Decreased ROM      Stability: No instability detected Muscle Tone/Strength: Functionally intact. No obvious neuro-muscular anomalies detected. Sensory (Neurological): Movement-associated discomfort Palpation: Complains of area being tender to palpation       Provocative Tests: Lumbar Hyperextension and rotation test: evaluation deferred today       Lumbar Lateral bending test: evaluation deferred today       Patrick's Maneuver: evaluation deferred today                    Gait & Posture Assessment  Ambulation: Patient came in today in a wheel chair Gait: Very limited, using assistive device to ambulate Posture: Kyphosis-lordosis   Lower Extremity Exam    Side: Right lower extremity  Side: Left lower extremity  Skin & Extremity Inspection: Atrophy  Skin & Extremity Inspection: Atrophy  Functional ROM: Decreased ROM  Functional ROM: Decreased ROM          Muscle Tone/Strength: Mild-to-moderate deconditioning  Muscle Tone/Strength: Mild-to-moderate deconditioning  Sensory (Neurological): Musculoskeletal pain pattern  Sensory (Neurological): Musculoskeletal pain pattern  Palpation: No palpable anomalies  Palpation: No palpable anomalies   Assessment  Primary Diagnosis & Pertinent Problem List: The primary encounter diagnosis was Chronic pain syndrome. Diagnoses of Postherpetic neuralgia, Primary osteoarthritis of both knees, DDD (degenerative disc disease), lumbar, and Bilateral occipital neuralgia were also pertinent to this visit.  Visit Diagnosis (New problems to examiner): 1. Chronic pain syndrome   2. Postherpetic neuralgia   3. Primary osteoarthritis of both knees   4. DDD  (degenerative disc disease), lumbar   5. Bilateral occipital neuralgia    Plan of Care (Initial workup plan)    83 yo F with significant dementia with bilateral knee pain and left facial pain. Knee pain 2/2 to advanced osteoarthritis and left facial pain could be associated with post-herpetic neuralgia. Do not recommend any opioid therapy given mental status and advanced dementia. Patient may be sensitive to many non-opioid analgesic and would not recommend chronic NSAID therapy for Ms Ostrosky. For facial neuropathic pain, recommend Lyrica 25 mg qhs. For knee pain, recommend IA steroid injection. Caregiver to discuss with patient's daughter regarding knee injection.  -Stop Gabapentin -Lyrica 25 mg qhs -Follow up in 4 weeks, can discuss bilateral knee injections at that time   Pharmacotherapy (current): Medications ordered:  Meds ordered this encounter  Medications  . pregabalin (LYRICA) 25 MG capsule    Sig: Take 1 capsule (25 mg total) at bedtime by mouth.    Dispense:  30 capsule    Refill:  1   Medications administered during this visit: [name] had no medications administered during this visit.    Provider-requested follow-up: Return in about 4 weeks (around 08/05/2017) for Medication Management.  Future Appointments  Date Time Provider [place]  08/05/2017 10:00 AM Gillis Santa, MD [facility] None  09/07/2017 10:00 AM Lada, Satira Anis, MD [facility]    Primary Care Physician: [name], MD Location: [facility] Note by: Gillis Santa, M.D, Date: 07/08/2017; Time: 10:01 AM  Patient Instructions  1.  Start Lyrica 25 mg nightly. 2.  Discontinue gabapentin. 3.  Discuss knee injections with patient's daughter, we will discuss at next visit. 4.  Follow-up in approximately 4 weeks.  4 weeks medication

## 2017-07-16 ENCOUNTER — Other Ambulatory Visit: Payer: Self-pay | Admitting: Family Medicine

## 2017-07-16 NOTE — Telephone Encounter (Signed)
She sees neurologist at [facility] clinic, Rx should come from that prescriber

## 2017-07-19 ENCOUNTER — Other Ambulatory Visit: Payer: Self-pay | Admitting: Family Medicine

## 2017-07-30 ENCOUNTER — Other Ambulatory Visit: Payer: Self-pay | Admitting: Family Medicine

## 2017-08-01 MED ORDER — ASPIRIN EC 81 MG PO TBEC: 81.0000 mg | DELAYED_RELEASE_TABLET | Freq: Every day | ORAL | 11 refills | Status: DC

## 2017-08-01 NOTE — Telephone Encounter (Signed)
New orders to patient's residential facility:  1.  Discontinue chewable aspirin daily  2. Start 81 mg enteric coated aspirin one by mouth daily   ____________________________ Loma Sousa, M.D.

## 2017-08-01 NOTE — Telephone Encounter (Signed)
Please make sure patient gets her Namenda from her neurologist I'll refill her aspirin Thank you

## 2017-08-04 ENCOUNTER — Other Ambulatory Visit: Payer: Self-pay | Admitting: Family Medicine

## 2017-08-04 NOTE — Telephone Encounter (Signed)
Called [facility], spoke with caregiver informed her that [place] should be coming from the pt's neurologist at [facility] provided her with their office number. She states she will call pharmacy and had rx sent to them.

## 2017-08-05 ENCOUNTER — Encounter: Payer: Self-pay | Admitting: Student in an Organized Health Care Education/Training Program

## 2017-08-05 ENCOUNTER — Ambulatory Visit
Payer: Medicare Other | Attending: Student in an Organized Health Care Education/Training Program | Admitting: Student in an Organized Health Care Education/Training Program

## 2017-08-05 ENCOUNTER — Other Ambulatory Visit: Payer: Self-pay

## 2017-08-05 VITALS — BP 134/45 | HR 55 | Temp 98.3°F | Resp 14 | Ht 65.0 in | Wt 150.0 lb

## 2017-08-05 DIAGNOSIS — F0390 Unspecified dementia without behavioral disturbance: Secondary | ICD-10-CM | POA: Insufficient documentation

## 2017-08-05 DIAGNOSIS — N183 Chronic kidney disease, stage 3 (moderate): Secondary | ICD-10-CM | POA: Insufficient documentation

## 2017-08-05 DIAGNOSIS — K5909 Other constipation: Secondary | ICD-10-CM | POA: Diagnosis not present

## 2017-08-05 DIAGNOSIS — R32 Unspecified urinary incontinence: Secondary | ICD-10-CM | POA: Insufficient documentation

## 2017-08-05 DIAGNOSIS — Z5181 Encounter for therapeutic drug level monitoring: Secondary | ICD-10-CM | POA: Diagnosis not present

## 2017-08-05 DIAGNOSIS — E878 Other disorders of electrolyte and fluid balance, not elsewhere classified: Secondary | ICD-10-CM | POA: Insufficient documentation

## 2017-08-05 DIAGNOSIS — M170 Bilateral primary osteoarthritis of knee: Secondary | ICD-10-CM | POA: Diagnosis not present

## 2017-08-05 DIAGNOSIS — E538 Deficiency of other specified B group vitamins: Secondary | ICD-10-CM | POA: Insufficient documentation

## 2017-08-05 DIAGNOSIS — Z8249 Family history of ischemic heart disease and other diseases of the circulatory system: Secondary | ICD-10-CM | POA: Insufficient documentation

## 2017-08-05 DIAGNOSIS — B0229 Other postherpetic nervous system involvement: Secondary | ICD-10-CM | POA: Diagnosis not present

## 2017-08-05 DIAGNOSIS — E785 Hyperlipidemia, unspecified: Secondary | ICD-10-CM | POA: Insufficient documentation

## 2017-08-05 DIAGNOSIS — Z13820 Encounter for screening for osteoporosis: Secondary | ICD-10-CM | POA: Diagnosis not present

## 2017-08-05 DIAGNOSIS — Z8673 Personal history of transient ischemic attack (TIA), and cerebral infarction without residual deficits: Secondary | ICD-10-CM | POA: Diagnosis not present

## 2017-08-05 DIAGNOSIS — D649 Anemia, unspecified: Secondary | ICD-10-CM | POA: Insufficient documentation

## 2017-08-05 DIAGNOSIS — Z79899 Other long term (current) drug therapy: Secondary | ICD-10-CM | POA: Insufficient documentation

## 2017-08-05 DIAGNOSIS — G894 Chronic pain syndrome: Secondary | ICD-10-CM

## 2017-08-05 DIAGNOSIS — J309 Allergic rhinitis, unspecified: Secondary | ICD-10-CM | POA: Diagnosis not present

## 2017-08-05 DIAGNOSIS — E871 Hypo-osmolality and hyponatremia: Secondary | ICD-10-CM | POA: Insufficient documentation

## 2017-08-05 DIAGNOSIS — M5136 Other intervertebral disc degeneration, lumbar region: Secondary | ICD-10-CM

## 2017-08-05 DIAGNOSIS — Z7982 Long term (current) use of aspirin: Secondary | ICD-10-CM | POA: Insufficient documentation

## 2017-08-05 DIAGNOSIS — I129 Hypertensive chronic kidney disease with stage 1 through stage 4 chronic kidney disease, or unspecified chronic kidney disease: Secondary | ICD-10-CM | POA: Diagnosis not present

## 2017-08-05 MED ORDER — PREGABALIN 25 MG PO CAPS: ORAL_CAPSULE | ORAL | 2 refills | Status: DC

## 2017-08-05 NOTE — Patient Instructions (Signed)
1. Decrease Lyrica to 25 mg every other night 2. Ok to take Tylenol 650 mg up to three times a day for breakthrough pain. 3. Okay to take ibuprofen 400 mg up to 3 times a day for breakthrough pain. 4.  Follow-up in 3 months.

## 2017-08-05 NOTE — Progress Notes (Signed)
Patient's Name: Melissa Combs  MRN: [id number]  Referring Provider: [name], MD  DOB: Jun 28, 1930  PCP: [name], MD  DOS: 08/05/2017  Note by: Gillis Santa, MD  Service setting: Ambulatory outpatient  Specialty: Interventional Pain Management  Location: [facility] (AMB) Pain Management Facility    Patient type: Established   Primary Reason(s) for Visit: Encounter for prescription drug management. (Level of risk: moderate)  CC: no pain (no pain at this time)  HPI  Melissa Combs is a 83 y.o. year old, female patient, who comes today for a medication management evaluation. She has Post herpetic neuralgia head; Bilateral occipital neuralgia; DDD (degenerative disc disease), lumbar; DJD (degenerative joint disease) of knee; Glaucoma; Hypertension; Dementia; TIA (transient ischemic attack); Anemia; Arthritis; Chronic constipation; Postherpetic neuralgia; CKD (chronic kidney disease) stage 3, GFR 30-59 ml/min (HCC); Dyslipidemia; Medication monitoring encounter; Bilateral leg edema; Hypochloremia; Hyponatremia; Osteoporosis screening; B12 deficiency; Allergic rhinitis; Urinary incontinence; and Chronic pain syndrome on their problem list. Her primarily concern today is the no pain (no pain at this time)  Pain Assessment: Location: Left Head and bilateral knees Radiating: (patient unable to answer question) Onset:  years Duration:  persistent Quality: (patient unable to answer question)- throbbing and achy per care giver Severity: (unable to assess)/10 (self-reported pain score)  Note: Unable to assess                        Effect on ADL:  limits  Timing:  not effected Modifying factors:   Ms. Noga was last scheduled for an appointment on 07/08/2017 for medication management. During today's appointment we reviewed Melissa Combs's chronic pain status, as well as her outpatient medication regimen.  83 year old female with significant dementia who presents for follow-up after her initial evaluation  on May 08, 2017.  Patient at that time was endorsing severe bilateral knee pain secondary to advanced osteoarthritis.   caregiver states that her pain is significantly improved but that the Lyrica is causing her to be more sedated than usual.  This is a dose of 25 mg nightly.  Otherwise the caregiver as well as the patient's daughter are pleased with her pain control on the current regimen.  The patient  reports that she does not use drugs. Her body mass index is 24.96 kg/m.  Further details on both, my assessment(s), as well as the proposed treatment plan, please see below.   Laboratory Chemistry  Inflammation Markers (CRP: Acute Phase) (ESR: Chronic Phase) No results found for: CRP, ESRSEDRATE, LATICACIDVEN               Rheumatology Markers No results found for: RF, ANA, [facility], LYMEIGGIGMAB, [facility]              Renal Function Markers Lab Results  Component Value Date   BUN 15 04/29/2017   CREATININE 0.71 04/29/2017   GFRAA 71 09/02/2016   GFRNONAA 61 09/02/2016                 Hepatic Function Markers Lab Results  Component Value Date   AST 14 09/02/2016   ALT 3 (L) 03/11/2017   ALBUMIN 4.1 09/02/2016   ALKPHOS 47 09/02/2016                 Electrolytes Lab Results  Component Value Date   NA 139 04/29/2017   K 4.2 04/29/2017   CL 95 (L) 04/29/2017   CALCIUM 9.4 04/29/2017  Neuropathy Markers Lab Results  Component Value Date   VITAMINB12 1,036 09/02/2016                 Bone Pathology Markers Lab Results  Component Value Date   VD25OH 40.7 06/27/2015                 Coagulation Parameters Lab Results  Component Value Date   PLT 264 04/29/2017                 Cardiovascular Markers Lab Results  Component Value Date   TROPONINI < 0.02 02/01/2013   HGB 10.8 (L) 04/29/2017   HCT 34.1 (L) 04/29/2017                 CA Markers No results found for: CEA, CA125, LABCA2               Note: Lab results  reviewed.  Recent Diagnostic Imaging Results  DG Knee Complete 4 Views Right CLINICAL DATA:  Right knee pain and swelling.  EXAM: RIGHT KNEE - COMPLETE 4+ VIEW  COMPARISON:  01/06/2011  FINDINGS: No evidence of acute fracture or dislocation.  Moderate tricompartmental osteoarthritis is again demonstrated as well as patellar enthesopathy. A small knee joint effusion is present but decreased in size compared to previous study.  IMPRESSION: Moderate tricompartmental osteoarthritis and small knee joint effusion. No evidence of fracture.  Electronically Signed   By: Earle Gell M.D.   On: 03/01/2016 15:42 DG FEMUR, MIN 2 VIEWS RIGHT CLINICAL DATA:  Right medial distal femoral pain. No known injury. Dementia.  EXAM: RIGHT FEMUR 2 VIEWS  COMPARISON:  None.  FINDINGS: Mild degenerate change of the right hip. Moderate osteoarthritic change of the right knee. No acute fracture dislocation.  IMPRESSION: No acute findings.  Degenerative changes of the right knee greater than the right hip.  Electronically Signed   By: Marin Olp M.D.   On: 03/01/2016 15:16  Complexity Note: Imaging results reviewed. Results shared with Melissa Combs, using Layman's terms.                         Meds   Current Outpatient Medications:  .  amLODipine (NORVASC) 2.5 MG tablet, TAKE 1 AND 1/2 TABLETS (3.75MG) BY MOUTH EVERY DAY, Disp: 45 tablet, Rfl: 11 .  aspirin EC 81 MG tablet, Take 1 tablet (81 mg total) by mouth daily., Disp: 30 tablet, Rfl: 11 .  bismuth subsalicylate (PEPTO BISMOL) 262 MG/15ML suspension, Take 30 mLs by mouth every 6 (six) hours as needed., Disp: , Rfl:  .  brimonidine-timolol (COMBIGAN) 0.2-0.5 % ophthalmic solution, Place 1 drop into both eyes 2 (two) times daily., Disp: , Rfl:  .  carvedilol (COREG) 6.25 MG tablet, TAKE ONE TABLET BY MOUTH TWO TIMES A DAY, Disp: 60 tablet, Rfl: 11 .  citalopram (CELEXA) 10 MG tablet, Take 1 tablet (10 mg total) by mouth daily.,  Disp: 30 tablet, Rfl: 11 .  Diapers & Supplies (HUGGIES PULL-UPS) MISC, 1 each by Does not apply route 3 (three) times daily. Adult size medium, urinary and stool incontinence, LON 99 months, Disp: 100 each, Rfl: 5 .  diclofenac sodium (VOLTAREN) 1 % GEL, Apply 2 grams to each knee three times a day while awake, for example, 8 am, 2 pm, and 8 pm, Disp: 100 g, Rfl: 5 .  divalproex (DEPAKOTE SPRINKLES) 125 MG capsule, Take 1 capsule (125 mg total) by mouth 2 (two) times daily.,  Disp: , Rfl:  .  donepezil (ARICEPT) 10 MG tablet, Take 1 tablet (10 mg total) by mouth at bedtime., Disp: 30 tablet, Rfl: 11 .  fluticasone (FLONASE) 50 MCG/ACT nasal spray, USE 2 SPRAYS IN BOTH NOSTRILS ONCE DAILYFOR RHINITIS, Disp: 16 g, Rfl: 11 .  GNP VITAMIN B-12 500 MCG tablet, TAKE 1 TABLET BY MOUTH ONCE DAILY FOR SUPPLEMENT, Disp: 30 tablet, Rfl: 12 .  latanoprost (XALATAN) 0.005 % ophthalmic solution, Place 1 drop into both eyes at bedtime., Disp: , Rfl:  .  lisinopril (PRINIVIL,ZESTRIL) 20 MG tablet, TAKE ONE TABLET BY MOUTH EVERY DAY, Disp: 30 tablet, Rfl: 5 .  montelukast (SINGULAIR) 5 MG chewable tablet, CHEW 1 TABLET BY MOUTH AT BEDTIME, Disp: 30 tablet, Rfl: 11 .  NAMENDA XR 28 MG CP24 24 hr capsule, TAKE 1 CAPSULE BY MOUTH ONCE DAILY FOR MEMORY, Disp: 30 capsule, Rfl: 11 .  polyethylene glycol powder (GLYCOLAX/MIRALAX) powder, MIX 17 GR (1 CAPFUL) IN 8 OZ WATER/JUICEAND DRINK ONCE DAILY AS NEEDED TO FOR CONSTIPATION, Disp: 527 g, Rfl: 11 .  pravastatin (PRAVACHOL) 20 MG tablet, Take 1 tablet (20 mg total) by mouth at bedtime., Disp: 30 tablet, Rfl: 6 .  pregabalin (LYRICA) 25 MG capsule, 25 mg qhs every other night, Disp: 30 capsule, Rfl: 2 .  Difluprednate (DUREZOL) 0.05 % EMUL, Apply 1 drop to eye. Right eye, Disp: , Rfl:  .  Polyethyl Glycol-Propyl Glycol (SYSTANE) 0.4-0.3 % SOLN, Apply 1 drop to eye 4 (four) times daily., Disp: , Rfl:   ROS  Constitutional: Denies any fever or chills Gastrointestinal: No  reported hemesis, hematochezia, vomiting, or acute GI distress Musculoskeletal: Denies any acute onset joint swelling, redness, loss of ROM, or weakness Neurological: No reported episodes of acute onset apraxia, aphasia, dysarthria, agnosia, amnesia, paralysis, loss of coordination, or loss of consciousness  Allergies  Ms. Molock has No Known Allergies.  PFSH  Drug: Ms. Lietzke  reports that she does not use drugs. Alcohol:  reports that she does not drink alcohol. Tobacco:  reports that  has never smoked. she has never used smokeless tobacco. Medical:  has a past medical history of Allergy, Anemia, Arthritis, B12 deficiency (11/06/2015), Chronic constipation, Dementia, Dementia, Family history of adverse reaction to anesthesia, Glaucoma, History of fibrocystic disease of breast, History of hand fracture, History of shingles (2014), History of wrist fracture, Hypertension, Impacted cerumen, Pneumonia, Postherpetic neuralgia, and TIA (transient ischemic attack) (Feb 2016). Surgical: Ms. Wrubel  has a past surgical history that includes Breast lumpectomy and Cataract extraction w/PHACO (Right, 01/12/2017). Family: Family history is unknown by patient.  Constitutional Exam  General appearance: Well nourished, well developed, and well hydrated. In no apparent acute distress Vitals:   08/05/17 1009  BP: (!) 134/45  Pulse: (!) 55  Resp: 14  Temp: 98.3 F (36.8 C)  TempSrc: Oral  SpO2: 100%  Weight: 150 lb (68 kg)  Height: 5' 5"  (1.651 m)   BMI Assessment: Estimated body mass index is 24.96 kg/m as calculated from the following:   Height as of this encounter: 5' 5"  (1.651 m).   Weight as of this encounter: 150 lb (68 kg).  BMI interpretation table: BMI level Category Range association with higher incidence of chronic pain  <18 kg/m2 Underweight   18.5-24.9 kg/m2 Ideal body weight   25-29.9 kg/m2 Overweight Increased incidence by 20%  30-34.9 kg/m2 Obese (Class I) Increased incidence by  68%  35-39.9 kg/m2 Severe obesity (Class II) Increased incidence by 136%  >40 kg/m2  Extreme obesity (Class III) Increased incidence by 254%   BMI Readings from Last 4 Encounters:  08/05/17 24.96 kg/m  07/08/17 25.75 kg/m  04/29/17 22.55 kg/m  01/12/17 23.86 kg/m   Wt Readings from Last 4 Encounters:  08/05/17 150 lb (68 kg)  07/08/17 150 lb (68 kg)  04/29/17 131 lb 6.4 oz (59.6 kg)  01/12/17 139 lb (63 kg)  Psych/Mental status: not orientated to person, place, or time       Eyes: PERLA Respiratory: No evidence of acute respiratory distress  Cervical Spine Area Exam  Skin & Axial Inspection: No masses, redness, edema, swelling, or associated skin lesions Alignment: Asymmetric Functional ROM: Decreased ROM      Stability: No instability detected Muscle Tone/Strength: Functionally intact. No obvious neuro-muscular anomalies detected. Sensory (Neurological): Articular pain pattern Palpation: Trigger Point                     Upper Extremity (UE) Exam    Side: Right upper extremity  Side: Left upper extremity   Skin & Extremity Inspection: Atrophy  Skin & Extremity Inspection: Atrophy   Functional ROM: Decreased ROM for all joints of upper extremity  Functional ROM: Decreased ROM           Muscle Tone/Strength: Movement possible against some resistance (4/5)  Muscle Tone/Strength: Movement possible against some resistance (4/5)   Sensory (Neurological): Movement-associated pain          Sensory (Neurological): Movement-associated pain           Palpation: No palpable anomalies              Palpation: No palpable anomalies               Specialized Test(s): Deferred         Specialized Test(s): Deferred            Lumbar Spine Area Exam  Skin & Axial Inspection: No masses, redness, or swelling Alignment: Scoliosis detected Functional ROM: Decreased ROM      Stability: No instability detected Muscle Tone/Strength: Functionally intact. No obvious neuro-muscular  anomalies detected. Sensory (Neurological): Movement-associated discomfort Palpation: Complains of area being tender to palpation       Provocative Tests: Lumbar Hyperextension and rotation test: evaluation deferred today       Lumbar Lateral bending test: evaluation deferred today       Patrick's Maneuver: evaluation deferred today                    Gait & Posture Assessment  Ambulation: Patient came in today in a wheel chair Gait: Very limited, using assistive device to ambulate Posture: Kyphosis-lordosis   Lower Extremity Exam    Side: Right lower extremity  Side: Left lower extremity  Skin & Extremity Inspection: Atrophy  Skin & Extremity Inspection: Atrophy  Functional ROM: Decreased ROM          Functional ROM: Decreased ROM          Muscle Tone/Strength: Mild-to-moderate deconditioning  Muscle Tone/Strength: Mild-to-moderate deconditioning  Sensory (Neurological): Musculoskeletal pain pattern  Sensory (Neurological): Musculoskeletal pain pattern  Palpation: No palpable anomalies  Palpation: No palpable anomalies    Assessment  Primary Diagnosis & Pertinent Problem List: The primary encounter diagnosis was Chronic pain syndrome. Diagnoses of Postherpetic neuralgia, Primary osteoarthritis of both knees, and DDD (degenerative disc disease), lumbar were also pertinent to this visit.  Status Diagnosis  Controlled Controlled Controlled 1. Chronic pain syndrome   2.  Postherpetic neuralgia   3. Primary osteoarthritis of both knees   4. DDD (degenerative disc disease), lumbar       83 year old female with a history of severe dementia with chronic pain secondary to primary osteoarthritis of bilateral knees and lumbar degenerative disc disease.  Patient was started on Lyrica 25 mg nightly at last visit which is been effective for her pain symptoms according to the patient's caregiver.  However the patient is sedated on this dose.  I will have her take Lyrica 25 mg nightly  every other night.  This may help reduce her symptoms of sedation.  For breakthrough pain, patient can alternate with 650 mg of Tylenol up to 3 times a day, ibuprofen up to 600 mg daily as needed for breakthrough pain.  Non-opioid management only for the patient's symptoms.  Plan: -Decrease Lyrica to 25 mg every other night -Tylenol 650 mg 3 times daily as needed breakthrough pain, ibuprofen 200 mg 3 times daily as needed breakthrough pain. -Follow-up in 3 months.   Future: Intra-articular bilateral knee injections versus bilateral genicular nerve block  Plan of Care  Pharmacotherapy (Medications Ordered): Meds ordered this encounter  Medications  . pregabalin (LYRICA) 25 MG capsule    Sig: 25 mg qhs every other night    Dispense:  30 capsule    Refill:  2    Provider-requested follow-up: Return in about 3 months (around 11/03/2017) for Medication Management.  Future Appointments  Date Time Provider [place]  09/07/2017 10:00 AM [name], MD [facility]  11/04/2017 10:45 AM Gillis Santa, MD [facility] None    Primary Care Physician: [name], MD Location: [facility] Management Facility Note by: Gillis Santa, M.D Date: 08/05/2017; Time: 1:46 PM  Patient Instructions  1. Decrease Lyrica to 25 mg every other night 2. Ok to take Tylenol 650 mg up to three times a day for breakthrough pain. 3. Okay to take ibuprofen 400 mg up to 3 times a day for breakthrough pain. 4.  Follow-up in 3 months.

## 2017-08-10 ENCOUNTER — Other Ambulatory Visit: Payer: Self-pay | Admitting: Family Medicine

## 2017-08-10 NOTE — Telephone Encounter (Signed)
Patient needs to get this from her neurologist please Thank you

## 2017-08-11 NOTE — Telephone Encounter (Signed)
[employer]. Informed them that this medication should be coming from pt's neurologist who is at [facility]. Pharmacy Tech gave verbal understanding.

## 2017-08-26 ENCOUNTER — Other Ambulatory Visit: Payer: Self-pay | Admitting: Family Medicine

## 2017-09-02 ENCOUNTER — Emergency Department
Admission: EM | Admit: 2017-09-02 | Discharge: 2017-09-02 | Disposition: A | Discharge status: Home / Self Care | Payer: Medicare Other | Attending: Emergency Medicine | Admitting: Emergency Medicine

## 2017-09-02 ENCOUNTER — Other Ambulatory Visit: Payer: Self-pay

## 2017-09-02 ENCOUNTER — Ambulatory Visit (INDEPENDENT_AMBULATORY_CARE_PROVIDER_SITE_OTHER): Payer: Medicare Other | Admitting: Family Medicine

## 2017-09-02 ENCOUNTER — Encounter: Payer: Self-pay | Admitting: Family Medicine

## 2017-09-02 ENCOUNTER — Emergency Department: Payer: Medicare Other

## 2017-09-02 VITALS — BP 160/78 | HR 85 | Temp 98.2°F | Wt 135.9 lb

## 2017-09-02 DIAGNOSIS — Z7902 Long term (current) use of antithrombotics/antiplatelets: Secondary | ICD-10-CM | POA: Diagnosis not present

## 2017-09-02 DIAGNOSIS — I129 Hypertensive chronic kidney disease with stage 1 through stage 4 chronic kidney disease, or unspecified chronic kidney disease: Secondary | ICD-10-CM | POA: Insufficient documentation

## 2017-09-02 DIAGNOSIS — R0902 Hypoxemia: Secondary | ICD-10-CM | POA: Diagnosis not present

## 2017-09-02 DIAGNOSIS — I499 Cardiac arrhythmia, unspecified: Secondary | ICD-10-CM | POA: Diagnosis not present

## 2017-09-02 DIAGNOSIS — Z8673 Personal history of transient ischemic attack (TIA), and cerebral infarction without residual deficits: Secondary | ICD-10-CM | POA: Diagnosis not present

## 2017-09-02 DIAGNOSIS — I491 Atrial premature depolarization: Secondary | ICD-10-CM | POA: Diagnosis not present

## 2017-09-02 DIAGNOSIS — N183 Chronic kidney disease, stage 3 unspecified: Secondary | ICD-10-CM

## 2017-09-02 DIAGNOSIS — R32 Unspecified urinary incontinence: Secondary | ICD-10-CM

## 2017-09-02 DIAGNOSIS — Z79899 Other long term (current) drug therapy: Secondary | ICD-10-CM | POA: Diagnosis not present

## 2017-09-02 DIAGNOSIS — F0390 Unspecified dementia without behavioral disturbance: Secondary | ICD-10-CM | POA: Diagnosis not present

## 2017-09-02 DIAGNOSIS — E538 Deficiency of other specified B group vitamins: Secondary | ICD-10-CM

## 2017-09-02 DIAGNOSIS — J189 Pneumonia, unspecified organism: Secondary | ICD-10-CM | POA: Insufficient documentation

## 2017-09-02 DIAGNOSIS — D631 Anemia in chronic kidney disease: Secondary | ICD-10-CM | POA: Diagnosis not present

## 2017-09-02 DIAGNOSIS — Z7982 Long term (current) use of aspirin: Secondary | ICD-10-CM | POA: Diagnosis not present

## 2017-09-02 DIAGNOSIS — R918 Other nonspecific abnormal finding of lung field: Secondary | ICD-10-CM | POA: Diagnosis not present

## 2017-09-02 DIAGNOSIS — Z5181 Encounter for therapeutic drug level monitoring: Secondary | ICD-10-CM | POA: Diagnosis not present

## 2017-09-02 LAB — CBC
HCT: 34.5 % — ABNORMAL LOW (ref 35.0–47.0)
Hemoglobin: 11.2 g/dL — ABNORMAL LOW (ref 12.0–16.0)
MCH: 29.1 pg (ref 26.0–34.0)
MCHC: 32.5 g/dL (ref 32.0–36.0)
MCV: 89.7 fL (ref 80.0–100.0)
PLATELETS: 216 10*3/uL (ref 150–440)
RBC: 3.85 MIL/uL (ref 3.80–5.20)
RDW: 14.3 % (ref 11.5–14.5)
WBC: 11.3 10*3/uL — ABNORMAL HIGH (ref 3.6–11.0)

## 2017-09-02 LAB — BASIC METABOLIC PANEL
Anion gap: 8 (ref 5–15)
BUN: 16 mg/dL (ref 6–20)
CHLORIDE: 95 mmol/L — AB (ref 101–111)
CO2: 35 mmol/L — ABNORMAL HIGH (ref 22–32)
CREATININE: 0.71 mg/dL (ref 0.44–1.00)
Calcium: 9.5 mg/dL (ref 8.9–10.3)
GFR calc non Af Amer: >60 mL/min (ref >60)
Glucose, Bld: 89 mg/dL (ref 65–99)
POTASSIUM: 4.7 mmol/L (ref 3.5–5.1)
Sodium: 138 mmol/L (ref 135–145)

## 2017-09-02 LAB — TROPONIN I: Troponin I: <0.03 ng/mL (ref <0.03)

## 2017-09-02 MED ORDER — AMOXICILLIN-POT CLAVULANATE 875-125 MG PO TABS: 1.0000 | ORAL_TABLET | Freq: Two times a day (BID) | ORAL | 0 refills | Status: AC

## 2017-09-02 MED ORDER — DOXYCYCLINE HYCLATE 100 MG PO CAPS: 100.0000 mg | ORAL_CAPSULE | Freq: Two times a day (BID) | ORAL | 0 refills | Status: AC

## 2017-09-02 MED ORDER — DOXYCYCLINE HYCLATE 100 MG PO TABS
100.0000 mg | ORAL_TABLET | Freq: Once | ORAL | Status: AC
  Administered 2017-09-02: 100 mg via ORAL
  Filled 2017-09-02: qty 1

## 2017-09-02 MED ORDER — LORAZEPAM 2 MG/ML IJ SOLN
INTRAMUSCULAR | Status: AC
  Filled 2017-09-02: qty 1

## 2017-09-02 MED ORDER — AMLODIPINE BESYLATE 5 MG PO TABS: 5.0000 mg | ORAL_TABLET | Freq: Every day | ORAL | 3 refills | Status: DC

## 2017-09-02 MED ORDER — AMOXICILLIN-POT CLAVULANATE 875-125 MG PO TABS
1.0000 | ORAL_TABLET | Freq: Once | ORAL | Status: AC
  Administered 2017-09-02: 1 via ORAL
  Filled 2017-09-02: qty 1

## 2017-09-02 NOTE — ED Triage Notes (Signed)
Pt arrives to ED with caregiver. Pt is HOH and can't understand anything this RN asks her. Pt was at PCP office today and had low oxygen readings and EKG changes. Pt is 100% in triage on room air. Pt c/o no pain. Caregiver states that PCP wanted blood work and chest xray performed. Hx dementia.

## 2017-09-02 NOTE — Progress Notes (Signed)
BP (!) 160/78 (BP Location: Right Arm, Patient Position: Sitting, Cuff Size: Normal)   Pulse 85   Temp 98.2 F (36.8 C) (Oral)   Wt 135 lb 14.4 oz (61.6 kg)   SpO2 (!) 87%   BMI 22.61 kg/m   MD note: We tried multiple times with different pulse oximetry devies, only saw 90% one time very briefly; remainder of the pulse ox readings were in the 80s  Subjective:    Patient ID: Melissa Combs, female    DOB: 12-05-1929, 84 y.o.   MRN: [id number]  HPI: Melissa Combs is a 84 y.o. female  Chief Complaint  Patient presents with  . Form Completion  . Annual Exam    HPI Patient is here for an FL-2 form completion She has dementia and resides at [facility] She sees a neurologist for her memory and dementia medicine She is constantly disoriented; pleasant with no behavior problems She requires assistance with bathing, feeding, and dressing She uses a walker for ambulation She has issues with hearing She is incontinent of her urine and bowel according to group home staff She eats a mechanical soft diet No plavix in the last neurology note dated 05/26/17; Dr. Melrose Nakayama was in agreement with plan; no new TIA or stroke-like symptoms Hx of anemia; last H/H 06/07/33.1 High cholesterol; last total chol 202, last LDL 118 Last methylmalonic acid normal (Jan 2018) B12 was 390, RPR negative  Depression screen [facility] 2/9 08/05/2017 04/29/2017 12/31/2016 09/22/2016 09/02/2016  Decreased Interest 0 0 0 0 1  Down, Depressed, Hopeless 0 0 0 0 1  PHQ - 2 Score 0 0 0 0 2  Altered sleeping - - - - 1  Tired, decreased energy - - - - 0  Change in appetite - - - - 1  Feeling bad or failure about yourself  - - - - 1  Trouble concentrating - - - - 0  Moving slowly or fidgety/restless - - - - 0  Suicidal thoughts - - - - 0  PHQ-9 Score - - - - 5    Relevant past medical, surgical, family and social history reviewed Past Medical History:  Diagnosis Date  . Allergy   . Anemia   . Arthritis   .  B12 deficiency 11/06/2015   Less than 400 October 2016  . Chronic constipation   . Dementia   . Dementia   . Family history of adverse reaction to anesthesia    brother, daughter and grandson had malignant hyperthermia in the past  . Glaucoma   . History of fibrocystic disease of breast    biopsy done, negative  . History of hand fracture    left  . History of shingles 2014  . History of wrist fracture    left  . Hypertension   . Impacted cerumen   . Pneumonia   . Postherpetic neuralgia   . TIA (transient ischemic attack) Feb 2016   Past Surgical History:  Procedure Laterality Date  . BREAST LUMPECTOMY     unsure which side  . CATARACT EXTRACTION W/PHACO Right 01/12/2017   Procedure: CATARACT EXTRACTION PHACO AND INTRAOCULAR LENS PLACEMENT (IOC);  Surgeon: Birder Robson, MD;  Location: [facility] ORS;  Service: Ophthalmology;  Laterality: Right;  [place] 1:05.8 AP% 26.0 CDE 17.07 Fluid pack lot # [id number]   Family History  Family history unknown: Yes   Social History   Tobacco Use  . Smoking status: Never Smoker  . Smokeless tobacco: Never Used  Substance  Use Topics  . Alcohol use: No    Alcohol/week: 0.0 oz  . Drug use: No   Interim medical history since last visit reviewed. Allergies and medications reviewed  Review of Systems Per HPI unless specifically indicated above     Objective:    BP (!) 160/78 (BP Location: Right Arm, Patient Position: Sitting, Cuff Size: Normal)   Pulse 85   Temp 98.2 F (36.8 C) (Oral)   Wt 135 lb 14.4 oz (61.6 kg)   SpO2 (!) 87%   BMI 22.61 kg/m   Wt Readings from Last 3 Encounters:  09/02/17 135 lb 14.4 oz (61.6 kg)  08/05/17 150 lb (68 kg)  07/08/17 150 lb (68 kg)  MD note: suspect the last two weights at the pain clinic were not actually measured, since both are exactly 150 pounds  Physical Exam  Constitutional: She appears well-developed and well-nourished. No distress.  Weight was 131 pounds in August with me    Cardiovascular: Normal rate.  Extrasystoles are present.  Pulmonary/Chest: Effort normal and breath sounds normal. She has no wheezes.  Neurological: She displays no tremor. Gait abnormal.  Slow gait, deliberate  Skin: She is not diaphoretic. No pallor.  Psychiatric: Her mood appears not anxious. Cognition and memory are impaired. She does not exhibit a depressed mood. She exhibits abnormal remote memory.      Assessment & Plan:   Problem List Items Addressed This Visit      Cardiovascular and Mediastinum   PAC (premature atrial contraction)    Will check labs today      Relevant Medications   amLODipine (NORVASC) 5 MG tablet     Nervous and Auditory   Dementia    Followed by neurologist; living in group home, requiring 24/7 observation, care for her safety        Genitourinary   CKD (chronic kidney disease) stage 3, GFR 30-59 ml/min (HCC)    Check kidney function; STOP NSAIDs        Other   Urinary incontinence    Care at group home      Medication monitoring encounter    Check liver and kidneys today      B12 deficiency    Check B12 level today      Anemia    Check CBC       Other Visit Diagnoses    Irregular heart rhythm    -  Primary   EKG done today; two PACs captured, otherwise sinus bradycardia   Relevant Orders   EKG 12-Lead   Hypoxia       despite multiple attempts here on different fingers with different devices, the patient remains hypoxic; will send to ER for evaluation       Follow up plan: No Follow-up on file.  An after-visit summary was printed and given to the patient at [facility].  Please see the patient instructions which may contain other information and recommendations beyond what is mentioned above in the assessment and plan.  Meds ordered this encounter  Medications  . amLODipine (NORVASC) 5 MG tablet    Sig: Take 1 tablet (5 mg total) by mouth daily.    Dispense:  90 tablet    Refill:  3    Orders Placed This Encounter   Procedures  . EKG 12-Lead

## 2017-09-02 NOTE — Assessment & Plan Note (Signed)
Followed by neurologist; living in group home, requiring 24/7 observation, care for her safety

## 2017-09-02 NOTE — Assessment & Plan Note (Signed)
Check kidney function; STOP NSAIDs

## 2017-09-02 NOTE — ED Notes (Signed)
Attempted to obtain blood twice. Unsuccessful. Asked [facility] ED medic to attempt to obtain blood work.

## 2017-09-02 NOTE — Assessment & Plan Note (Signed)
Will check labs today

## 2017-09-02 NOTE — ED Notes (Signed)
Pt taken to room 9 by EDT, Jann to be placed on card monitor for further evaluation; report called to care nurse Raj Janus, RN

## 2017-09-02 NOTE — Patient Instructions (Signed)
Go from here to the ER because we need to have Melissa Combs' oxygen level evaluated and she may need a chest xray I'll recommend some labs to check her frequent heart rhythm skips

## 2017-09-02 NOTE — ED Notes (Signed)
Pt ambulating with oxygen sats at 98-100% on room air   md aware.

## 2017-09-02 NOTE — Assessment & Plan Note (Signed)
Care at group home

## 2017-09-02 NOTE — ED Notes (Signed)
Upon review of chart, noted protocols not completed in triage; charge nurse immediately notified

## 2017-09-02 NOTE — Assessment & Plan Note (Signed)
Check CBC 

## 2017-09-02 NOTE — Assessment & Plan Note (Signed)
Check liver and kidneys today 

## 2017-09-02 NOTE — Assessment & Plan Note (Signed)
Check B12 level today.  

## 2017-09-02 NOTE — ED Notes (Signed)
[place] signature pad not working at time of discharge; discharge instructions discussed with Clair Gulling, caregiver at [facility].  Caregiver expresses verbal understanding of paperwork and has no further questions at this time.

## 2017-09-02 NOTE — ED Notes (Signed)
md in with pt.  Pt has dementia and is from a group home.  Pt is refusing all care.  md at bedside and spoke with daughter via phone.  Daughter requesting blood work and iv be done.  Care giver with pt from group home.

## 2017-09-02 NOTE — ED Notes (Signed)
Iv team in with pt for iv and labs.

## 2017-09-02 NOTE — ED Provider Notes (Signed)
[facility] Emergency Department Provider Note  ____________________________________________  Time seen: Approximately 11:00 PM  I have reviewed the triage vital signs and the nursing notes.   HISTORY  Chief Complaint Sent by doctor  Level 5 caveat:  Portions of the history and physical were unable to be obtained due to dementia   HPI Melissa Combs is a 84 y.o. female with a history of dementia, anemia, chronic kidney disease who presents from her primary care doctor's office for hypoxia.Patient went for a regular appointment today. No fever, cough, vomiting, diarrhea, chest pain, shortness of breath. Patient found to be hypoxic at the PCP's office. Multiple attempts at blood draw at PCP failed and patient sent here for evaluation. Patient is very annoyed to be here and she is refusing all care. I spoke with her daughter who is patient's healthcare power of attorney who told me she wishes to have labs and chest x-ray done and allowed IM sedation if that's needed to get an IV and blood work done.  Past Medical History:  Diagnosis Date  . Allergy   . Anemia   . Arthritis   . B12 deficiency 11/06/2015   Less than 400 October 2016  . Chronic constipation   . Dementia   . Dementia   . Family history of adverse reaction to anesthesia    brother, daughter and grandson had malignant hyperthermia in the past  . Glaucoma   . History of fibrocystic disease of breast    biopsy done, negative  . History of hand fracture    left  . History of shingles 2014  . History of wrist fracture    left  . Hypertension   . Impacted cerumen   . Pneumonia   . Postherpetic neuralgia   . TIA (transient ischemic attack) Feb 2016    Patient Active Problem List   Diagnosis Date Noted  . PAC (premature atrial contraction) 09/02/2017  . Chronic pain syndrome 07/08/2017  . Urinary incontinence 10/03/2016  . Allergic rhinitis 11/29/2015  . B12 deficiency 11/06/2015  .  Osteoporosis screening 11/01/2015  . Hypochloremia 09/29/2015  . Hyponatremia 09/29/2015  . Medication monitoring encounter 09/27/2015  . Bilateral leg edema 09/27/2015  . Dyslipidemia 06/27/2015  . CKD (chronic kidney disease) stage 3, GFR 30-59 ml/min (HCC) 03/28/2015  . Glaucoma   . Hypertension   . Dementia   . Anemia   . Arthritis   . Chronic constipation   . Postherpetic neuralgia   . Post herpetic neuralgia head 01/13/2015  . Bilateral occipital neuralgia 01/13/2015  . DDD (degenerative disc disease), lumbar 01/13/2015  . DJD (degenerative joint disease) of knee 01/13/2015  . TIA (transient ischemic attack) 10/01/2014    Past Surgical History:  Procedure Laterality Date  . BREAST LUMPECTOMY     unsure which side  . CATARACT EXTRACTION W/PHACO Right 01/12/2017   Procedure: CATARACT EXTRACTION PHACO AND INTRAOCULAR LENS PLACEMENT (IOC);  Surgeon: Birder Robson, MD;  Location: [facility] ORS;  Service: Ophthalmology;  Laterality: Right;  [place] 1:05.8 AP% 26.0 CDE 17.07 Fluid pack lot # [id number]    Prior to Admission medications   Medication Sig Start Date End Date Taking? Authorizing Provider  acetaminophen (TYLENOL 8 HOUR ARTHRITIS PAIN) 650 MG CR tablet Take 650 mg by mouth 3 (three) times daily as needed.     [provider]  amLODipine (NORVASC) 5 MG tablet Take 1 tablet (5 mg total) by mouth daily. 09/02/17   [name], MD  amoxicillin-clavulanate (  AUGMENTIN) 875-125 MG tablet Take 1 tablet by mouth 2 (two) times daily for 10 days. 09/02/17 09/12/17  Rudene Re, MD  aspirin EC 81 MG tablet Take 1 tablet (81 mg total) by mouth daily. 08/01/17   [name], MD  Bismuth Subsalicylate 518 AC/16SA SUSP Take 30 mLs (1,050 mg total) by mouth 2 (two) times daily as needed. 09/02/17   Lada, Satira Anis, MD  brimonidine-timolol (COMBIGAN) 0.2-0.5 % ophthalmic solution Place 1 drop into both eyes 2 (two) times daily.    [provider]  carvedilol (COREG)  6.25 MG tablet TAKE ONE TABLET BY MOUTH TWO TIMES A DAY 04/06/17   Lada, Satira Anis, MD  citalopram (CELEXA) 10 MG tablet Take 1 tablet (10 mg total) by mouth daily. 09/28/16   [name], MD  Diapers & Supplies (HUGGIES PULL-UPS) MISC 1 each by Does not apply route 3 (three) times daily. Adult size medium, urinary and stool incontinence, LON 99 months 09/22/16   [name], MD  diclofenac sodium (VOLTAREN) 1 % GEL Apply 2 grams to each knee three times a day while awake, for example, 8 am, 2 pm, and 8 pm 09/22/16   Lada, Satira Anis, MD  divalproex (DEPAKOTE SPRINKLES) 125 MG capsule Take 250 mg by mouth 3 (three) times daily.  12/31/16   Lada, Satira Anis, MD  donepezil (ARICEPT) 10 MG tablet Take 1 tablet (10 mg total) by mouth at bedtime. 10/02/16   [name], MD  doxycycline (VIBRAMYCIN) 100 MG capsule Take 1 capsule (100 mg total) by mouth 2 (two) times daily for 10 days. 09/02/17 09/12/17  Rudene Re, MD  fluticasone (FLONASE) 50 MCG/ACT nasal spray USE 2 SPRAYS IN BOTH NOSTRILS ONCE DAILYFOR RHINITIS 04/24/16   Lada, Satira Anis, MD  latanoprost (XALATAN) 0.005 % ophthalmic solution Place 1 drop into both eyes at bedtime.    [provider]  lisinopril (PRINIVIL,ZESTRIL) 20 MG tablet TAKE ONE TABLET BY MOUTH EVERY DAY 06/24/17   Lada, Satira Anis, MD  montelukast (SINGULAIR) 5 MG chewable tablet CHEW 1 TABLET BY MOUTH AT BEDTIME 04/06/17   Lada, Satira Anis, MD  NAMENDA XR 28 MG CP24 24 hr capsule TAKE 1 CAPSULE BY MOUTH ONCE DAILY FOR MEMORY 05/13/16   Lada, Satira Anis, MD  polyethylene glycol powder (GLYCOLAX/MIRALAX) powder MIX 17 GR (1 CAPFUL) IN 8 OZ WATER/JUICEAND DRINK ONCE DAILY AS NEEDED TO FOR CONSTIPATION 04/14/16   Lada, Satira Anis, MD  pravastatin (PRAVACHOL) 20 MG tablet Take 1 tablet (20 mg total) by mouth at bedtime. 03/13/17   [name], MD  pregabalin (LYRICA) 25 MG capsule 25 mg qhs every other night 08/05/17   Gillis Santa, MD  vitamin B-12 (CYANOCOBALAMIN) 500 MCG  tablet TAKE ONE TABLET BY MOUTH EVERY DAY 08/26/17   Lada, Satira Anis, MD    Allergies Patient has no known allergies.  Family History  Family history unknown: Yes    Social History Social History   Tobacco Use  . Smoking status: Never Smoker  . Smokeless tobacco: Never Used  Substance Use Topics  . Alcohol use: No    Alcohol/week: 0.0 oz  . Drug use: No    Review of Systems  Constitutional: Negative for fever. Cardiovascular: Negative for chest pain. Respiratory: Negative for shortness of breath, cough. + hypoxia Gastrointestinal: Negative for abdominal pain, vomiting or diarrhea. Genitourinary: Negative for dysuria. Musculoskeletal: Negative for back pain.  Level 5 caveat:  Portions of the history and physical were  unable to be obtained due to dementia  ____________________________________________   PHYSICAL EXAM:  VITAL SIGNS: ED Triage Vitals  Enc Vitals Group     BP 09/02/17 1552 (!) 192/58     Pulse Rate 09/02/17 1552 (!) 59     Resp 09/02/17 1552 16     Temp 09/02/17 1552 98.2 F (36.8 C)     Temp Source 09/02/17 1936 Oral     SpO2 09/02/17 1552 100 %     Weight --      Height --      Head Circumference --      Peak Flow --      Pain Score 09/02/17 1552 0     Pain Loc --      Pain Edu? --      Excl. in [place]? --     Constitutional: Alert and oriented to self only. Well appearing and in no apparent distress. HEENT:      Head: Normocephalic and atraumatic.         Eyes: Conjunctivae are normal. Sclera is non-icteric.       Mouth/Throat: Mucous membranes are moist.       Neck: Supple with no signs of meningismus. Cardiovascular: Regular rate and rhythm. No murmurs, gallops, or rubs. 2+ symmetrical distal pulses are present in all extremities. No JVD. Respiratory: Normal respiratory effort. Lungs are clear to auscultation bilaterally. No wheezes, crackles, or rhonchi.  Gastrointestinal: Soft, non tender, and non distended with positive bowel sounds. No  rebound or guarding. Musculoskeletal: Nontender with normal range of motion in all extremities. No edema, cyanosis, or erythema of extremities. Neurologic: Normal speech and language. Face is symmetric. Moving all extremities. No gross focal neurologic deficits are appreciated. Skin: Skin is warm, dry and intact. No rash noted. Psychiatric: Mood and affect are normal. Speech and behavior are normal.  ____________________________________________   LABS (all labs ordered are listed, but only abnormal results are displayed)  Labs Reviewed  BASIC METABOLIC PANEL - Abnormal; Notable for the following components:      Result Value   Chloride 95 (*)    CO2 35 (*)    All other components within normal limits  CBC - Abnormal; Notable for the following components:   WBC 11.3 (*)    Hemoglobin 11.2 (*)    HCT 34.5 (*)    All other components within normal limits  TROPONIN I  URINALYSIS, COMPLETE (UACMP) WITH MICROSCOPIC   ____________________________________________  EKG  ED ECG REPORT I, Rudene Re, the attending physician, personally viewed and interpreted this ECG.  Sinus bradycardia, rate of 50, normal intervals, normal axis, no ST elevations or depressions. Bradycardia is new when compared to prior from 2014 ____________________________________________  RADIOLOGY  CXR:  1. Cardiomegaly with normal pulmonary vascularity.  2. Mild basilar infiltrate on lateral view cannot be excluded. ____________________________________________   PROCEDURES  Procedure(s) performed: None Procedures Critical Care performed:  None ____________________________________________   INITIAL IMPRESSION / ASSESSMENT AND PLAN / ED COURSE  84 y.o. female with a history of dementia, anemia, chronic kidney disease who presents from her primary care doctor's office for hypoxia.Patient went to her doctor for regular checkup, she has had no fever, no vomiting, no nausea, no diarrhea, no cough, no  shortness of breath. She was found to be hypoxic at the PCPs office. Patient has had no hypoxia in the emergency department. Her lungs are clear to auscultation, her vital signs are within normal limits. Her chest x-ray concerning for maybe a  mild basilar infiltrate and she does have a slight elevated white count of 11.3 therefore decided to treat her for pneumonia with Augmentin and doxycycline. Patient ambulated in the emergency department with sats 99-100%. She has no respiratory distress. Remainder of her labs are within normal limits. Her EKG is non ischemic. Recommended close follow-up with primary care doctor. Caretakers at the bedside. I believe patient is safe for discharge at this time.      As part of my medical decision making, I reviewed the following data within the [place] History obtained from family, Nursing notes reviewed and incorporated, Labs reviewed , EKG interpreted , Old EKG reviewed, Radiograph reviewed , Notes from prior ED visits and [place] Controlled Substance Database    Pertinent labs & imaging results that were available during my care of the patient were reviewed by me and considered in my medical decision making (see chart for details).    ____________________________________________   FINAL CLINICAL IMPRESSION(S) / ED DIAGNOSES  Final diagnoses:  Community acquired pneumonia, unspecified laterality      NEW MEDICATIONS STARTED DURING THIS VISIT:  ED Discharge Orders        Ordered    amoxicillin-clavulanate (AUGMENTIN) 875-125 MG tablet  2 times daily     09/02/17 2308    doxycycline (VIBRAMYCIN) 100 MG capsule  2 times daily     09/02/17 2308       Note:  This document was prepared using Dragon voice recognition software and may include unintentional dictation errors.    Alfred Levins, [place], MD 09/02/17 [phone]

## 2017-09-02 NOTE — ED Notes (Signed)
Report off to ashley rn  

## 2017-09-02 NOTE — ED Notes (Signed)
Pt resting with eyes closed   Caregiver with pt.  Pt calm and cooperative at this time.

## 2017-09-02 NOTE — ED Notes (Signed)
Unable to get blood, advised charge nurse

## 2017-09-02 NOTE — Discharge Instructions (Signed)

## 2017-09-02 NOTE — ED Notes (Signed)
Lab called for blood work attempt on patient.  Patient refused to allow lab to collect blood at this time.  Patient verbalized refusal of any blood work at this time.

## 2017-09-02 NOTE — ED Notes (Signed)
ED Provider at bedside. 

## 2017-09-07 ENCOUNTER — Ambulatory Visit: Payer: Medicare Other

## 2017-09-07 ENCOUNTER — Encounter: Payer: Self-pay | Admitting: Family Medicine

## 2017-09-07 ENCOUNTER — Ambulatory Visit (INDEPENDENT_AMBULATORY_CARE_PROVIDER_SITE_OTHER): Payer: Medicare Other | Admitting: Family Medicine

## 2017-09-07 VITALS — BP 134/62 | HR 94 | Temp 98.2°F | Wt 138.2 lb

## 2017-09-07 DIAGNOSIS — I10 Essential (primary) hypertension: Secondary | ICD-10-CM | POA: Diagnosis not present

## 2017-09-07 DIAGNOSIS — F0390 Unspecified dementia without behavioral disturbance: Secondary | ICD-10-CM

## 2017-09-07 DIAGNOSIS — J189 Pneumonia, unspecified organism: Secondary | ICD-10-CM | POA: Diagnosis not present

## 2017-09-07 NOTE — Patient Instructions (Addendum)
Please do eat yogurt with active cultures daily for the next month We want to replace the healthy germs in the gut If you notice foul, watery diarrhea in the next two months, schedule an appointment RIGHT AWAY or go to an urgent care or the emergency room if a holiday or over a weekend Please take her to the outpatient imaging center on [address] on or just after September 16, 2017

## 2017-09-07 NOTE — Progress Notes (Signed)
BP 134/62 (BP Location: Left Arm, Patient Position: Sitting, Cuff Size: Normal)   Pulse 94   Temp 98.2 F (36.8 C) (Oral)   Wt 138 lb 3.2 oz (62.7 kg)   SpO2 93%   BMI 23.00 kg/m    Subjective:    Patient ID: Melissa Combs, female    DOB: Mar 10, 1930, 84 y.o.   MRN: [id number]  HPI: Melissa Combs is a 84 y.o. female  Chief Complaint  Patient presents with  . Follow-up    HPI Patient here for f/u Last visit, her oxygen was low; she was sent to the ER and diagnosed with pneumonia No fevers No sweats No coughing No other residents are coughing or sick BP was 130/47 yesterday when caregiver checked it yesterday; 47 or 57; patient was asx Using walker for ambulation Caregiver feels her level of care is appropriate for right now; dementia appears stable Flu shot this season is UTD, August 2018  WBC was mildly elevated; anemia stable; reviewed ER labs  CHEST XRAY Sep 02, 2017: CLINICAL DATA:  Hypoxia.  EXAM: CHEST  2 VIEW  COMPARISON:  02/01/2013 .  FINDINGS: Mediastinum and hilar structures normal. Cardiomegaly with normal pulmonary vascularity. Mild basilar infiltrate on lateral view cannot be excluded. No acute bony abnormality  IMPRESSION: 1. Cardiomegaly with normal pulmonary vascularity.  2.  Mild basilar infiltrate on lateral view cannot be excluded.   Electronically Signed   By: Marcello Moores  Register   On: 09/02/2017 16:46  Depression screen PHQ 2/9 09/07/2017 08/05/2017 04/29/2017 12/31/2016 09/22/2016  Decreased Interest 0 0 0 0 0  Down, Depressed, Hopeless 0 0 0 0 0  PHQ - 2 Score 0 0 0 0 0  Altered sleeping - - - - -  Tired, decreased energy - - - - -  Change in appetite - - - - -  Feeling bad or failure about yourself  - - - - -  Trouble concentrating - - - - -  Moving slowly or fidgety/restless - - - - -  Suicidal thoughts - - - - -  PHQ-9 Score - - - - -    Relevant past medical, surgical, family and social history reviewed Past Medical  History:  Diagnosis Date  . Allergy   . Anemia   . Arthritis   . B12 deficiency 11/06/2015   Less than 400 October 2016  . Chronic constipation   . Dementia   . Dementia   . Family history of adverse reaction to anesthesia    brother, daughter and grandson had malignant hyperthermia in the past  . Glaucoma   . History of fibrocystic disease of breast    biopsy done, negative  . History of hand fracture    left  . History of shingles 2014  . History of wrist fracture    left  . Hypertension   . Impacted cerumen   . Pneumonia   . Postherpetic neuralgia   . TIA (transient ischemic attack) Feb 2016   Past Surgical History:  Procedure Laterality Date  . BREAST LUMPECTOMY     unsure which side  . CATARACT EXTRACTION W/PHACO Right 01/12/2017   Procedure: CATARACT EXTRACTION PHACO AND INTRAOCULAR LENS PLACEMENT (IOC);  Surgeon: Birder Robson, MD;  Location: [facility] ORS;  Service: Ophthalmology;  Laterality: Right;  [place] 1:05.8 AP% 26.0 CDE 17.07 Fluid pack lot # [id number]   Family History  Family history unknown: Yes   Social History   Tobacco Use  . Smoking status: Never Smoker  .  Smokeless tobacco: Never Used  Substance Use Topics  . Alcohol use: No    Alcohol/week: 0.0 oz  . Drug use: No    Interim medical history since last visit reviewed. Allergies and medications reviewed  Review of Systems Per HPI unless specifically indicated above     Objective:    BP 134/62 (BP Location: Left Arm, Patient Position: Sitting, Cuff Size: Normal)   Pulse 94   Temp 98.2 F (36.8 C) (Oral)   Wt 138 lb 3.2 oz (62.7 kg)   SpO2 93%   BMI 23.00 kg/m   Wt Readings from Last 3 Encounters:  09/07/17 138 lb 3.2 oz (62.7 kg)  09/02/17 135 lb 14.4 oz (61.6 kg)  08/05/17 150 lb (68 kg)    Physical Exam  Constitutional: She appears well-developed and well-nourished. No distress.  Eyes: No scleral icterus.  Cardiovascular: Normal rate and regular rhythm.  No extrasystoles are  present.  Pulmonary/Chest: Effort normal. No accessory muscle usage. No respiratory distress. She has no decreased breath sounds. She has no wheezes. She has no rhonchi.  Neurological: She is alert.  demented  Skin: She is not diaphoretic. No pallor.  Psychiatric: Her mood appears not anxious. Her speech is not delayed and not slurred. She is not agitated, not aggressive and not combative. Cognition and memory are impaired. She does not exhibit a depressed mood. She exhibits abnormal recent memory and abnormal remote memory.  Pleasant, demented, answers questions; poor historian   Results for orders placed or performed during the hospital encounter of 95/62/13  Basic metabolic panel  Result Value Ref Range   Sodium 138 135 - 145 mmol/L   Potassium 4.7 3.5 - 5.1 mmol/L   Chloride 95 (L) 101 - 111 mmol/L   CO2 35 (H) 22 - 32 mmol/L   Glucose, Bld 89 65 - 99 mg/dL   BUN 16 6 - 20 mg/dL   Creatinine, Ser 0.71 0.44 - 1.00 mg/dL   Calcium 9.5 8.9 - 10.3 mg/dL   GFR calc non Af Amer >60 >60 mL/min   GFR calc Af Amer >60 >60 mL/min   Anion gap 8 5 - 15  CBC  Result Value Ref Range   WBC 11.3 (H) 3.6 - 11.0 K/uL   RBC 3.85 3.80 - 5.20 MIL/uL   Hemoglobin 11.2 (L) 12.0 - 16.0 g/dL   HCT 34.5 (L) 35.0 - 47.0 %   MCV 89.7 80.0 - 100.0 fL   MCH 29.1 26.0 - 34.0 pg   MCHC 32.5 32.0 - 36.0 g/dL   RDW 14.3 11.5 - 14.5 %   Platelets 216 150 - 440 K/uL  Troponin I  Result Value Ref Range   Troponin I <0.03 <0.03 ng/mL      Assessment & Plan:   Problem List Items Addressed This Visit      Cardiovascular and Mediastinum   Hypertension    Improved BP; continue amlodipine        Nervous and Auditory   Dementia    Requiring care; caregiver says still appropriate level of care for them; she'll notify me if condition deteriorates, higher level of care needed       Other Visit Diagnoses    Pneumonia due to infectious organism, unspecified laterality, unspecified part of lung    -   Primary   continue the antibiotics; watch for C diff, discussed with caregiver; yogurt daily for one month; chest xray after 2 weeks to reassess   Relevant Orders   DG  Chest 2 View       Follow up plan: Return in about 4 months (around 01/05/2018) for follow-up visit with Dr. Sanda Klein.  An after-visit summary was printed and given to the patient at [place].  Please see the patient instructions which may contain other information and recommendations beyond what is mentioned above in the assessment and plan.  No orders of the defined types were placed in this encounter.   Orders Placed This Encounter  Procedures  . DG Chest 2 View

## 2017-09-07 NOTE — Assessment & Plan Note (Signed)
Improved BP; continue amlodipine

## 2017-09-07 NOTE — Assessment & Plan Note (Signed)
Requiring care; caregiver says still appropriate level of care for them; she'll notify me if condition deteriorates, higher level of care needed

## 2017-09-08 DIAGNOSIS — G459 Transient cerebral ischemic attack, unspecified: Secondary | ICD-10-CM | POA: Diagnosis not present

## 2017-09-08 DIAGNOSIS — F0390 Unspecified dementia without behavioral disturbance: Secondary | ICD-10-CM | POA: Diagnosis not present

## 2017-09-17 ENCOUNTER — Ambulatory Visit
Admission: RE | Admit: 2017-09-17 | Discharge: 2017-09-17 | Disposition: A | Discharge status: Home / Self Care | Payer: Medicare Other | Source: Ambulatory Visit | Attending: Family Medicine | Admitting: Family Medicine

## 2017-09-17 DIAGNOSIS — R05 Cough: Secondary | ICD-10-CM | POA: Diagnosis not present

## 2017-09-17 DIAGNOSIS — J189 Pneumonia, unspecified organism: Secondary | ICD-10-CM | POA: Diagnosis not present

## 2017-09-17 DIAGNOSIS — I517 Cardiomegaly: Secondary | ICD-10-CM | POA: Diagnosis not present

## 2017-09-20 ENCOUNTER — Telehealth: Payer: Self-pay

## 2017-09-20 NOTE — Telephone Encounter (Signed)
[place] ([facility]) No answer. Will call again.

## 2017-09-20 NOTE — Telephone Encounter (Signed)
-----   Message from [name], MD sent at 09/17/2017  4:49 PM EST ----- Please let patient know that her pneumonia is gone; good news; thank you

## 2017-09-22 NOTE — Telephone Encounter (Signed)
Called assisted living facility spoke with receptionist, LM for care giver informing her of results per Dr.Lada.

## 2017-10-11 DIAGNOSIS — G459 Transient cerebral ischemic attack, unspecified: Secondary | ICD-10-CM | POA: Diagnosis not present

## 2017-10-11 DIAGNOSIS — F0390 Unspecified dementia without behavioral disturbance: Secondary | ICD-10-CM | POA: Diagnosis not present

## 2017-10-22 ENCOUNTER — Ambulatory Visit (INDEPENDENT_AMBULATORY_CARE_PROVIDER_SITE_OTHER): Payer: Medicare Other | Admitting: Family Medicine

## 2017-10-22 ENCOUNTER — Encounter: Payer: Self-pay | Admitting: Family Medicine

## 2017-10-22 VITALS — BP 128/82 | HR 73 | Temp 97.9°F | Resp 16 | Ht 65.0 in | Wt 139.0 lb

## 2017-10-22 DIAGNOSIS — L608 Other nail disorders: Secondary | ICD-10-CM

## 2017-10-22 DIAGNOSIS — R1310 Dysphagia, unspecified: Secondary | ICD-10-CM

## 2017-10-22 DIAGNOSIS — M79674 Pain in right toe(s): Secondary | ICD-10-CM

## 2017-10-22 NOTE — Progress Notes (Signed)
Name: Melissa Combs   MRN: [id number]    DOB: 21-Jun-1930   Date:10/22/2017       Progress Note  Subjective  Chief Complaint  Chief Complaint  Patient presents with  . Toe Pain    Right pinky toe pain for 1 month has gotten worse  . Gastroesophageal Reflux    HPI  Pt presents with caregiver, Elie Confer, from [facility] who also contributes to the history,.  RIGHT lateral 5th toe has had ongoing pain for several weeks to months.  No redness, no drainage or bleeding; there is a callous to the area that rubs on her shoes.  She has never seen a podiatrist.  Pt has "always" complained of "heartburn", but over the last two weeks caregivers have noticed that she is complaining more of this discomfort only while eating along with increased belching.  Caregivers cut up her food very small and she has not had any episodes of choking, no change in appetite.  Pt has occasional constipation - takes Miralax as needed. Caregiver notes that patient has started asking for help patting her back to help her to burp. Denies changes in bowel movement,s no blood in stool, no dark and tarry stools, no nausea or abdominal pain.  No behavioral changes.  Patient Active Problem List   Diagnosis Date Noted  . PAC (premature atrial contraction) 09/02/2017  . Chronic pain syndrome 07/08/2017  . Urinary incontinence 10/03/2016  . Allergic rhinitis 11/29/2015  . B12 deficiency 11/06/2015  . Osteoporosis screening 11/01/2015  . Hypochloremia 09/29/2015  . Hyponatremia 09/29/2015  . Medication monitoring encounter 09/27/2015  . Bilateral leg edema 09/27/2015  . Dyslipidemia 06/27/2015  . CKD (chronic kidney disease) stage 3, GFR 30-59 ml/min (HCC) 03/28/2015  . Glaucoma   . Hypertension   . Dementia   . Anemia   . Arthritis   . Chronic constipation   . Postherpetic neuralgia   . Post herpetic neuralgia head 01/13/2015  . Bilateral occipital neuralgia 01/13/2015  . DDD (degenerative disc disease),  lumbar 01/13/2015  . DJD (degenerative joint disease) of knee 01/13/2015  . TIA (transient ischemic attack) 10/01/2014    Social History   Tobacco Use  . Smoking status: Never Smoker  . Smokeless tobacco: Never Used  Substance Use Topics  . Alcohol use: No    Alcohol/week: 0.0 oz     Current Outpatient Medications:  .  acetaminophen (TYLENOL 8 HOUR ARTHRITIS PAIN) 650 MG CR tablet, Take 650 mg by mouth 3 (three) times daily as needed. , Disp: , Rfl:  .  amLODipine (NORVASC) 5 MG tablet, Take 1 tablet (5 mg total) by mouth daily., Disp: 90 tablet, Rfl: 3 .  aspirin EC 81 MG tablet, Take 1 tablet (81 mg total) by mouth daily., Disp: 30 tablet, Rfl: 11 .  brimonidine-timolol (COMBIGAN) 0.2-0.5 % ophthalmic solution, Place 1 drop into both eyes 2 (two) times daily., Disp: , Rfl:  .  carvedilol (COREG) 6.25 MG tablet, TAKE ONE TABLET BY MOUTH TWO TIMES A DAY, Disp: 60 tablet, Rfl: 11 .  citalopram (CELEXA) 10 MG tablet, Take 1 tablet (10 mg total) by mouth daily., Disp: 30 tablet, Rfl: 11 .  Diapers & Supplies (HUGGIES PULL-UPS) MISC, 1 each by Does not apply route 3 (three) times daily. Adult size medium, urinary and stool incontinence, LON 99 months, Disp: 100 each, Rfl: 5 .  diclofenac sodium (VOLTAREN) 1 % GEL, Apply 2 grams to each knee three times a day while awake, for example,  8 am, 2 pm, and 8 pm, Disp: 100 g, Rfl: 5 .  divalproex (DEPAKOTE SPRINKLES) 125 MG capsule, Take 250 mg by mouth 3 (three) times daily. , Disp: , Rfl:  .  latanoprost (XALATAN) 0.005 % ophthalmic solution, Place 1 drop into both eyes at bedtime., Disp: , Rfl:  .  lisinopril (PRINIVIL,ZESTRIL) 20 MG tablet, TAKE ONE TABLET BY MOUTH EVERY DAY, Disp: 30 tablet, Rfl: 5 .  montelukast (SINGULAIR) 5 MG chewable tablet, CHEW 1 TABLET BY MOUTH AT BEDTIME, Disp: 30 tablet, Rfl: 11 .  NAMENDA XR 28 MG CP24 24 hr capsule, TAKE 1 CAPSULE BY MOUTH ONCE DAILY FOR MEMORY, Disp: 30 capsule, Rfl: 11 .  pravastatin (PRAVACHOL)  20 MG tablet, Take 1 tablet (20 mg total) by mouth at bedtime., Disp: 30 tablet, Rfl: 6 .  pregabalin (LYRICA) 25 MG capsule, 25 mg qhs every other night, Disp: 30 capsule, Rfl: 2 .  vitamin B-12 (CYANOCOBALAMIN) 500 MCG tablet, TAKE ONE TABLET BY MOUTH EVERY DAY, Disp: 28 tablet, Rfl: 12 .  Bismuth Subsalicylate 025 EN/27PO SUSP, Take 30 mLs (1,050 mg total) by mouth 2 (two) times daily as needed., Disp: , Rfl:  .  donepezil (ARICEPT) 10 MG tablet, Take 1 tablet (10 mg total) by mouth at bedtime., Disp: 30 tablet, Rfl: 11 .  fluticasone (FLONASE) 50 MCG/ACT nasal spray, USE 2 SPRAYS IN BOTH NOSTRILS ONCE DAILYFOR RHINITIS (Patient not taking: Reported on 10/22/2017), Disp: 16 g, Rfl: 11 .  polyethylene glycol powder (GLYCOLAX/MIRALAX) powder, MIX 17 GR (1 CAPFUL) IN 8 OZ WATER/JUICEAND DRINK ONCE DAILY AS NEEDED TO FOR CONSTIPATION, Disp: 527 g, Rfl: 11  No Known Allergies  ROS Constitutional: Negative for fever or weight change.  Respiratory: Negative for cough and shortness of breath.   Cardiovascular: Negative for chest pain or palpitations.  Gastrointestinal: Negative for abdominal pain, no bowel changes.  Musculoskeletal: Negative for gait problem or joint swelling.  Skin: Negative for rash.  See HPI Neurological: Negative for dizziness or headache.  No other specific complaints in a complete review of systems (except as listed in HPI above).  Objective  Vitals:   10/22/17 1325  BP: 128/82  Pulse: 73  Resp: 16  Temp: 97.9 F (36.6 C)  TempSrc: Oral  SpO2: 96%  Weight: 139 lb (63 kg)  Height: 5\' 5"  (1.651 m)    Body mass index is 23.13 kg/m.  Nursing Note and Vital Signs reviewed.  Physical Exam  Constitutional: Patient appears well-developed and well-nourished.  No distress.  HEENT: head atraumatic, normocephalic, neck supple without lymphadenopathy or thyromegally, oropharynx pink and moist without exudate Cardiovascular: Normal rate, regular rhythm, S1/S2 present.   No murmur or rub heard. No BLE edema. Pulmonary/Chest: Effort normal and breath sounds clear. No respiratory distress or retractions. Abdominal: Soft and non-tender, bowel sounds present x4 quadrants.  Psychiatric: Patient has a normal mood and affect. behavior is normal. Judgment and thought content normal. Skin: RIGHT latera 5th metatarsal has thick callous present that is non-tender, non-erythematous without exudate or bleeding.  Bilateral onychomycosis is present.  Pedal pulses present, skin is warm and dry.   No results found for this or any previous visit (from the past 72 hour(s)).  Assessment & Plan  1. Dysphagia, unspecified type - Ambulatory referral to ENT  2. Onychomadesis of toenail - Ambulatory referral to Podiatry  3. Pain of toe of right foot - Ambulatory referral to Podiatry - Discussed signs and symptoms of infection in detail with caregiver.  -  Red flags and when to present for emergency care or RTC including fever >101.84F, chest pain, shortness of breath, new/worsening/un-resolving symptoms, reviewed with patient at time of visit. Follow up and care instructions discussed and provided in AVS.

## 2017-10-22 NOTE — Patient Instructions (Signed)
Continue to cut food into small bites, consider thickened liquids, ensure she is supervised while eating.

## 2017-11-04 ENCOUNTER — Encounter: Payer: Self-pay | Admitting: Student in an Organized Health Care Education/Training Program

## 2017-11-04 ENCOUNTER — Other Ambulatory Visit: Payer: Self-pay

## 2017-11-04 ENCOUNTER — Ambulatory Visit
Payer: Medicare Other | Attending: Student in an Organized Health Care Education/Training Program | Admitting: Student in an Organized Health Care Education/Training Program

## 2017-11-04 VITALS — BP 153/59 | HR 55 | Temp 98.1°F | Resp 16 | Ht 65.0 in | Wt 139.0 lb

## 2017-11-04 DIAGNOSIS — M5481 Occipital neuralgia: Secondary | ICD-10-CM | POA: Insufficient documentation

## 2017-11-04 DIAGNOSIS — Z79899 Other long term (current) drug therapy: Secondary | ICD-10-CM | POA: Insufficient documentation

## 2017-11-04 DIAGNOSIS — M5136 Other intervertebral disc degeneration, lumbar region: Secondary | ICD-10-CM | POA: Diagnosis not present

## 2017-11-04 DIAGNOSIS — Z5181 Encounter for therapeutic drug level monitoring: Secondary | ICD-10-CM | POA: Insufficient documentation

## 2017-11-04 DIAGNOSIS — N183 Chronic kidney disease, stage 3 (moderate): Secondary | ICD-10-CM | POA: Insufficient documentation

## 2017-11-04 DIAGNOSIS — Z9889 Other specified postprocedural states: Secondary | ICD-10-CM | POA: Insufficient documentation

## 2017-11-04 DIAGNOSIS — B0229 Other postherpetic nervous system involvement: Secondary | ICD-10-CM | POA: Diagnosis not present

## 2017-11-04 DIAGNOSIS — M170 Bilateral primary osteoarthritis of knee: Secondary | ICD-10-CM | POA: Insufficient documentation

## 2017-11-04 DIAGNOSIS — I129 Hypertensive chronic kidney disease with stage 1 through stage 4 chronic kidney disease, or unspecified chronic kidney disease: Secondary | ICD-10-CM | POA: Insufficient documentation

## 2017-11-04 DIAGNOSIS — G894 Chronic pain syndrome: Secondary | ICD-10-CM | POA: Insufficient documentation

## 2017-11-04 DIAGNOSIS — M79673 Pain in unspecified foot: Secondary | ICD-10-CM | POA: Diagnosis present

## 2017-11-04 DIAGNOSIS — M25561 Pain in right knee: Secondary | ICD-10-CM | POA: Diagnosis present

## 2017-11-04 MED ORDER — PRAVASTATIN SODIUM 20 MG PO TABS: 20.0000 mg | ORAL_TABLET | Freq: Every day | ORAL | 3 refills | Status: DC

## 2017-11-04 MED ORDER — LISINOPRIL 20 MG PO TABS: 20.0000 mg | ORAL_TABLET | Freq: Every day | ORAL | 3 refills | Status: DC

## 2017-11-04 MED ORDER — MELOXICAM 7.5 MG PO TABS: 7.5000 mg | ORAL_TABLET | Freq: Every day | ORAL | 0 refills | Status: DC

## 2017-11-04 MED ORDER — PREGABALIN 25 MG PO CAPS: ORAL_CAPSULE | ORAL | 2 refills | Status: DC

## 2017-11-04 NOTE — Telephone Encounter (Signed)
90 day supply

## 2017-11-04 NOTE — Patient Instructions (Signed)
Continue Lyrica 25 mg nightly every other night. Prescription provided for Mobic 7.5 mg daily for 30 days to be taken after meal. Continue Tylenol 500 mg up to 4 times a day as needed for pain.   Follow-up in 3 months

## 2017-11-04 NOTE — Progress Notes (Signed)
Patient's Name: Melissa Combs  MRN: [id number]  Referring Provider: [name], MD  DOB: [birth year]  PCP: [name], MD  DOS: 11/04/2017  Note by: Gillis Santa, MD  Service setting: Ambulatory outpatient  Specialty: Interventional Pain Management  Location: [facility] (AMB) Pain Management Facility    Patient type: Established   Primary Reason(s) for Visit: Encounter for prescription drug management. (Level of risk: moderate)  CC: Foot Pain (outside of foot) and Knee Pain (right)  HPI  Melissa Combs is a 84 y.o. year old, female patient, who comes today for a medication management evaluation. She has Post herpetic neuralgia head; Bilateral occipital neuralgia; DDD (degenerative disc disease), lumbar; DJD (degenerative joint disease) of knee; Glaucoma; Hypertension; Dementia; TIA (transient ischemic attack); Anemia; Arthritis; Chronic constipation; Postherpetic neuralgia; CKD (chronic kidney disease) stage 3, GFR 30-59 ml/min (HCC); Dyslipidemia; Medication monitoring encounter; Bilateral leg edema; Hypochloremia; Hyponatremia; Osteoporosis screening; B12 deficiency; Allergic rhinitis; Urinary incontinence; Chronic pain syndrome; and PAC (premature atrial contraction) on their problem list. Her primarily concern today is the Foot Pain (outside of foot) and Knee Pain (right)  Pain Assessment: Location: Left Foot(knee and eye) Radiating:  no does not radiate Onset: More than a month ago Duration: Chronic pain Quality: (unable to answer) Severity: 6 /10 (self-reported pain score)  Note: Reported level is inconsistent with clinical observations. Clinically the patient looks like a 2/10 A 1/10 is viewed as "Mild" and described as nagging, annoying, but not interfering with basic activities of daily living (ADL). Melissa Combs is able to eat, bathe, get dressed, do toileting (being able to get on and off the toilet and perform personal hygiene functions), transfer (move in and out of bed or a chair without  assistance), and maintain continence (able to control bladder and bowel functions). Physiologic parameters such as blood pressure and heart rate apear wnl.       When using our objective Pain Scale, levels between 6 and 10/10 are said to belong in an emergency room, as it progressively worsens from a 6/10, described as severely limiting, requiring emergency care not usually available at an outpatient pain management facility. At a 6/10 level, communication becomes difficult and requires great effort. Assistance to reach the emergency department may be required. Facial flushing and profuse sweating along with potentially dangerous increases in heart rate and blood pressure will be evident. Effect on ADL:  limits ability to walk and put shoes on Timing: Constant Modifying factors:    Melissa Combs was last scheduled for an appointment on 08/05/2017 for medication management. During today's appointment we reviewed Melissa Combs's chronic pain status, as well as her outpatient medication regimen.  Patient comes in with caregiver.  Has severe dementia.  Is not oriented to place or time.  Endorses pain in her right foot and b/l ankles.  Utilizing Lyrica 25 mg every other night along with as needed Tylenol. HPI obtained from caregiver  The patient  reports that she does not use drugs. Her body mass index is 23.13 kg/m.  Further details on both, my assessment(s), as well as the proposed treatment plan, please see below.   Laboratory Chemistry  Inflammation Markers (CRP: Acute Phase) (ESR: Chronic Phase) No results found for: CRP, ESRSEDRATE, LATICACIDVEN                       Rheumatology Markers No results found for: RF, ANA, LABURIC, URICUR, LYMEIGGIGMAB, LYMEABIGMQN  Renal Function Markers Lab Results  Component Value Date   BUN 16 09/02/2017   CREATININE 0.71 09/02/2017   GFRAA >60 09/02/2017   GFRNONAA >60 09/02/2017                 Hepatic Function Markers Lab Results  Component  Value Date   AST 14 09/02/2016   ALT 3 (L) 03/11/2017   ALBUMIN 4.1 09/02/2016   ALKPHOS 47 09/02/2016                 Electrolytes Lab Results  Component Value Date   NA 138 09/02/2017   K 4.7 09/02/2017   CL 95 (L) 09/02/2017   CALCIUM 9.5 09/02/2017                        Neuropathy Markers Lab Results  Component Value Date   VITAMINB12 1,036 09/02/2016                 Bone Pathology Markers Lab Results  Component Value Date   VD25OH 40.7 06/27/2015                         Coagulation Parameters Lab Results  Component Value Date   PLT 216 09/02/2017                 Cardiovascular Markers Lab Results  Component Value Date   TROPONINI <0.03 09/02/2017   HGB 11.2 (L) 09/02/2017   HCT 34.5 (L) 09/02/2017                 CA Markers No results found for: CEA, CA125, LABCA2               Note: Lab results reviewed.  Recent Diagnostic Imaging Results  DG Chest 2 View CLINICAL DATA:  Cough  EXAM: CHEST  2 VIEW  COMPARISON:  09/02/2017  FINDINGS: The heart is mildly enlarged. Normal vascularity. Lungs are clear. No pneumothorax or pleural effusion. Osteopenia.  IMPRESSION: Cardiomegaly without decompensation.  Electronically Signed   By: Marybelle Killings M.D.   On: 09/17/2017 13:47  Complexity Note: Imaging results reviewed. Results shared with Melissa Combs, using Layman's terms.                         Meds   Current Outpatient Medications:  .  acetaminophen (TYLENOL 8 HOUR ARTHRITIS PAIN) 650 MG CR tablet, Take 650 mg by mouth 3 (three) times daily as needed. , Disp: , Rfl:  .  amLODipine (NORVASC) 5 MG tablet, Take 1 tablet (5 mg total) by mouth daily., Disp: 90 tablet, Rfl: 3 .  aspirin EC 81 MG tablet, Take 1 tablet (81 mg total) by mouth daily., Disp: 30 tablet, Rfl: 11 .  Bismuth Subsalicylate 885 OY/77AJ SUSP, Take 30 mLs (1,050 mg total) by mouth 2 (two) times daily as needed., Disp: , Rfl:  .  brimonidine-timolol (COMBIGAN) 0.2-0.5 %  ophthalmic solution, Place 1 drop into both eyes 2 (two) times daily., Disp: , Rfl:  .  carvedilol (COREG) 6.25 MG tablet, TAKE ONE TABLET BY MOUTH TWO TIMES A DAY, Disp: 60 tablet, Rfl: 11 .  citalopram (CELEXA) 10 MG tablet, Take 1 tablet (10 mg total) by mouth daily., Disp: 30 tablet, Rfl: 11 .  Diapers & Supplies (HUGGIES PULL-UPS) MISC, 1 each by Does not apply route 3 (three) times daily. Adult size medium, urinary and stool incontinence,  LON 99 months, Disp: 100 each, Rfl: 5 .  diclofenac sodium (VOLTAREN) 1 % GEL, Apply 2 grams to each knee three times a day while awake, for example, 8 am, 2 pm, and 8 pm, Disp: 100 g, Rfl: 5 .  divalproex (DEPAKOTE SPRINKLES) 125 MG capsule, Take 250 mg by mouth 3 (three) times daily. , Disp: , Rfl:  .  donepezil (ARICEPT) 10 MG tablet, Take 1 tablet (10 mg total) by mouth at bedtime., Disp: 30 tablet, Rfl: 11 .  fluticasone (FLONASE) 50 MCG/ACT nasal spray, USE 2 SPRAYS IN BOTH NOSTRILS ONCE DAILYFOR RHINITIS, Disp: 16 g, Rfl: 11 .  latanoprost (XALATAN) 0.005 % ophthalmic solution, Place 1 drop into both eyes at bedtime., Disp: , Rfl:  .  lisinopril (PRINIVIL,ZESTRIL) 20 MG tablet, TAKE ONE TABLET BY MOUTH EVERY DAY, Disp: 30 tablet, Rfl: 5 .  montelukast (SINGULAIR) 5 MG chewable tablet, CHEW 1 TABLET BY MOUTH AT BEDTIME, Disp: 30 tablet, Rfl: 11 .  NAMENDA XR 28 MG CP24 24 hr capsule, TAKE 1 CAPSULE BY MOUTH ONCE DAILY FOR MEMORY, Disp: 30 capsule, Rfl: 11 .  polyethylene glycol powder (GLYCOLAX/MIRALAX) powder, MIX 17 GR (1 CAPFUL) IN 8 OZ WATER/JUICEAND DRINK ONCE DAILY AS NEEDED TO FOR CONSTIPATION, Disp: 527 g, Rfl: 11 .  pravastatin (PRAVACHOL) 20 MG tablet, Take 1 tablet (20 mg total) by mouth at bedtime., Disp: 30 tablet, Rfl: 6 .  pregabalin (LYRICA) 25 MG capsule, 25 mg qhs every other night, Disp: 30 capsule, Rfl: 2 .  vitamin B-12 (CYANOCOBALAMIN) 500 MCG tablet, TAKE ONE TABLET BY MOUTH EVERY DAY, Disp: 28 tablet, Rfl: 12 .  meloxicam (MOBIC)  7.5 MG tablet, Take 1 tablet (7.5 mg total) by mouth daily. For 30 days only, Disp: 30 tablet, Rfl: 0  ROS  Constitutional: Denies any fever or chills Gastrointestinal: No reported hemesis, hematochezia, vomiting, or acute GI distress Musculoskeletal: Denies any acute onset joint swelling, redness, loss of ROM, or weakness Neurological: No reported episodes of acute onset apraxia, aphasia, dysarthria, agnosia, amnesia, paralysis, loss of coordination, or loss of consciousness  Allergies  Melissa Combs has No Known Allergies.  PFSH  Drug: Melissa Combs  reports that she does not use drugs. Alcohol:  reports that she does not drink alcohol. Tobacco:  reports that  has never smoked. she has never used smokeless tobacco. Medical:  has a past medical history of Allergy, Anemia, Arthritis, B12 deficiency (11/06/2015), Chronic constipation, Dementia, Dementia, Family history of adverse reaction to anesthesia, Glaucoma, History of fibrocystic disease of breast, History of hand fracture, History of shingles (2014), History of wrist fracture, Hypertension, Impacted cerumen, Pneumonia, Postherpetic neuralgia, and TIA (transient ischemic attack) (Feb 2016). Surgical: Melissa Combs  has a past surgical history that includes Breast lumpectomy and Cataract extraction w/PHACO (Right, 01/12/2017). Family: Family history is unknown by patient.  Constitutional Exam  General appearance: cooperative, slowed mentation and disoriented Vitals:   11/04/17 1054  BP: (!) 153/59  Pulse: (!) 55  Resp: 16  Temp: 98.1 F (36.7 C)  SpO2: 100%  Weight: 139 lb (63 kg)  Height: _0  (1.651 m)   BMI Assessment: Estimated body mass index is 23.13 kg/m as calculated from the following:   Height as of this encounter: _1  (1.651 m).   Weight as of this encounter: 139 lb (63 kg).  BMI interpretation table: BMI level Category Range association with higher incidence of chronic pain  <18 kg/m2 Underweight   18.5-24.9 kg/m2 Ideal  body weight  25-29.9 kg/m2 Overweight Increased incidence by 20%  30-34.9 kg/m2 Obese (Class I) Increased incidence by 68%  35-39.9 kg/m2 Severe obesity (Class II) Increased incidence by 136%  >40 kg/m2 Extreme obesity (Class III) Increased incidence by 254%   BMI Readings from Last 4 Encounters:  11/04/17 23.13 kg/m  10/22/17 23.13 kg/m  09/07/17 23.00 kg/m  09/02/17 22.61 kg/m   Wt Readings from Last 4 Encounters:  11/04/17 139 lb (63 kg)  10/22/17 139 lb (63 kg)  09/07/17 138 lb 3.2 oz (62.7 kg)  09/02/17 135 lb 14.4 oz (61.6 kg)  Psych/Mental status: Disoriented to place and time.       Eyes: PERLA Respiratory: No evidence of acute respiratory distress  Cervical Spine Area Exam  Skin & Axial Inspection: No masses, redness, edema, swelling, or associated skin lesions Alignment: Symmetrical Functional ROM: Unrestricted ROM      Stability: No instability detected Muscle Tone/Strength: Functionally intact. No obvious neuro-muscular anomalies detected. Sensory (Neurological): Unimpaired Palpation: No palpable anomalies              Upper Extremity (UE) Exam    Side: Right upper extremity  Side: Left upper extremity  Skin & Extremity Inspection: Skin color, temperature, and hair growth are WNL. No peripheral edema or cyanosis. No masses, redness, swelling, asymmetry, or associated skin lesions. No contractures.  Skin & Extremity Inspection: Skin color, temperature, and hair growth are WNL. No peripheral edema or cyanosis. No masses, redness, swelling, asymmetry, or associated skin lesions. No contractures.  Functional ROM: Unrestricted ROM          Functional ROM: Unrestricted ROM          Muscle Tone/Strength: Functionally intact. No obvious neuro-muscular anomalies detected.  Muscle Tone/Strength: Functionally intact. No obvious neuro-muscular anomalies detected.  Sensory (Neurological): Unimpaired          Sensory (Neurological): Unimpaired          Palpation: No palpable  anomalies              Palpation: No palpable anomalies              Specialized Test(s): Deferred         Specialized Test(s): Deferred          Thoracic Spine Area Exam  Skin & Axial Inspection: No masses, redness, or swelling Alignment: Symmetrical Functional ROM: Unrestricted ROM Stability: No instability detected Muscle Tone/Strength: Functionally intact. No obvious neuro-muscular anomalies detected. Sensory (Neurological): Unimpaired Muscle strength & Tone: No palpable anomalies  Lumbar Spine Area Exam  Skin & Axial Inspection: No masses, redness, or swelling Alignment: Symmetrical Functional ROM: Unrestricted ROM      Stability: No instability detected Muscle Tone/Strength: Functionally intact. No obvious neuro-muscular anomalies detected. Sensory (Neurological): Unimpaired Palpation: No palpable anomalies       Provocative Tests: Lumbar Hyperextension and rotation test: evaluation deferred today       Lumbar Lateral bending test: evaluation deferred today       Patrick's Maneuver: evaluation deferred today                     Gait & Posture Assessment  Ambulation:Patient came in today in a wheel chair Gait:Very limited, using assistive device to ambulate Posture:Kyphosis-lordosis  Lower Extremity Exam    Side: Right lower extremity  Side: Left lower extremity  Skin & Extremity Inspection: Skin color, temperature, and hair growth are WNL. No peripheral edema or cyanosis. No masses, redness, swelling, asymmetry, or associated skin  lesions. No contractures.  Skin & Extremity Inspection: Skin color, temperature, and hair growth are WNL. No peripheral edema or cyanosis. No masses, redness, swelling, asymmetry, or associated skin lesions. No contractures.  Functional ROM: Unrestricted ROM          Functional ROM: Unrestricted ROM          Muscle Tone/Strength: Functionally intact. No obvious neuro-muscular anomalies detected.  Muscle Tone/Strength: Functionally intact. No  obvious neuro-muscular anomalies detected.  Sensory (Neurological): Unimpaired  Sensory (Neurological): Unimpaired  Palpation: No palpable anomalies  Palpation: No palpable anomalies   Assessment  Primary Diagnosis & Pertinent Problem List: The primary encounter diagnosis was Chronic pain syndrome. Diagnoses of Postherpetic neuralgia, Primary osteoarthritis of both knees, DDD (degenerative disc disease), lumbar, and Bilateral occipital neuralgia were also pertinent to this visit.  Status Diagnosis  Persistent Persistent Persistent 1. Chronic pain syndrome   2. Postherpetic neuralgia   3. Primary osteoarthritis of both knees   4. DDD (degenerative disc disease), lumbar   5. Bilateral occipital neuralgia      84 year old female with a history of severe dementia with chronic pain secondary to primary osteoarthritis of bilateral knees and lumbar degenerative disc disease.  Patient was started on Lyrica 25 mg every other night at last visit which is been effective for her pain symptoms according to the patient's caregiver and has been less sedating than Lyrica 25 mg q day.  Given worsening knee and right ankle pain secondary to osteoarthritis, I discussed Mobic 7.5 mg daily for 30 days.  Patient can continue to take Tylenol 650 mg 3 times daily as needed breakthrough pain.  I have instructed the caregiver not to give the patient any ibuprofen or any other NSAIDs while she is on meloxicam for 30 days. Discussed knee injections but per caregiver, wants to hold off at this point.   Plan of Care  Pharmacotherapy (Medications Ordered): Meds ordered this encounter  Medications  . pregabalin (LYRICA) 25 MG capsule    Sig: 25 mg qhs every other night    Dispense:  30 capsule    Refill:  2  . meloxicam (MOBIC) 7.5 MG tablet    Sig: Take 1 tablet (7.5 mg total) by mouth daily. For 30 days only    Dispense:  30 tablet    Refill:  0   Future: Intra-articular bilateral knee injections versus  bilateral genicular nerve block  Time Note: Greater than 50% of the 25 minute(s) of face-to-face time spent with Melissa Combs, was spent in counseling/coordination of care regarding: Melissa Combs primary cause of pain, the treatment plan, treatment alternatives, medication side effects and realistic expectations. Provider-requested follow-up: Return in about 3 months (around 02/04/2018) for Medication Management.  Future Appointments  Date Time Provider [place]  11/12/2017  9:00 AM Edrick Kins, DPM [id number]  01/05/2018 10:00 AM [name], MD [facility]  01/27/2018 10:45 AM Gillis Santa, MD [facility] None    Primary Care Physician: [name], MD Location: [facility] Note by: Gillis Santa, M.D Date: 11/04/2017; Time: 2:02 PM  Patient Instructions  Continue Lyrica 25 mg nightly every other night. Prescription provided for Mobic 7.5 mg daily for 30 days to be taken after meal. Continue Tylenol 500 mg up to 4 times a day as needed for pain.   Follow-up in 3 months

## 2017-11-05 ENCOUNTER — Telehealth: Payer: Self-pay | Admitting: Student in an Organized Health Care Education/Training Program

## 2017-11-05 NOTE — Telephone Encounter (Signed)
Melissa Combs, Assisted Living, Need to speak with someone regarding Tylenol. She is already on tylenol arthritis. Please call [phone]

## 2017-11-05 NOTE — Telephone Encounter (Signed)
Joy states patient is taking Tylenol arthritis and was ordered more Tylenol.  Informed Joy not to take any other Tylenol along with the Tylenol Arthritis.

## 2017-11-11 ENCOUNTER — Telehealth: Payer: Self-pay | Admitting: Student in an Organized Health Care Education/Training Program

## 2017-11-11 NOTE — Telephone Encounter (Signed)
Nursing needs D/C for Tylenol faxed over please. Fax [phone] Phone [phone]

## 2017-11-12 ENCOUNTER — Ambulatory Visit (INDEPENDENT_AMBULATORY_CARE_PROVIDER_SITE_OTHER): Payer: Medicare Other | Admitting: Podiatry

## 2017-11-12 ENCOUNTER — Encounter: Payer: Self-pay | Admitting: Podiatry

## 2017-11-12 DIAGNOSIS — L84 Corns and callosities: Secondary | ICD-10-CM | POA: Diagnosis not present

## 2017-11-12 NOTE — Progress Notes (Signed)
   Subjective: 84 year old female presenting today as a new patient with a chief complaint of a painful callus lesion located on the lateral aspect of the right 5th toe that has been present for the past 2 months. She has not done anything to treat her symptoms. Walking and bearing weight increases the pain. Patient is here for further evaluation and treatment.    Past Medical History:  Diagnosis Date  . Allergy   . Anemia   . Arthritis   . B12 deficiency 11/06/2015   Less than 400 October 2016  . Chronic constipation   . Dementia   . Dementia   . Family history of adverse reaction to anesthesia    brother, daughter and grandson had malignant hyperthermia in the past  . Glaucoma   . History of fibrocystic disease of breast    biopsy done, negative  . History of hand fracture    left  . History of shingles 2014  . History of wrist fracture    left  . Hypertension   . Impacted cerumen   . Pneumonia   . Postherpetic neuralgia   . TIA (transient ischemic attack) Feb 2016     Objective:  Physical Exam General: Alert and oriented x3 in no acute distress  Dermatology: Hyperkeratotic lesion present on the right fifth toe. Pain on palpation with a central nucleated core noted.  Skin is warm, dry and supple bilateral lower extremities. Negative for open lesions or macerations.  Vascular: Palpable pedal pulses bilaterally. No edema or erythema noted. Capillary refill within normal limits.  Neurological: Epicritic and protective threshold grossly intact bilaterally.   Musculoskeletal Exam: Pain on palpation at the keratotic lesion noted. Range of motion within normal limits bilateral. Muscle strength 5/5 in all groups bilateral.  Assessment: #1 Corn right fifth toe   Plan of Care:  #1 Patient evaluated #2 Excisional debridement of keratoic lesion using a chisel blade was performed without incident.  #3 Dressed area with light dressing. #4 recommended OTC corns and callus pads.    #5 Recommended wide fitting shoes.  #6 Patient is to return to the clinic PRN.   Edrick Kins, DPM [facility]  Dr. Edrick Kins, [address]                                        [place], [place] [address]                Office [phone]  Fax [phone]

## 2017-11-22 DIAGNOSIS — R1310 Dysphagia, unspecified: Secondary | ICD-10-CM | POA: Diagnosis not present

## 2017-11-26 ENCOUNTER — Telehealth: Payer: Self-pay | Admitting: Family Medicine

## 2017-11-26 DIAGNOSIS — R1310 Dysphagia, unspecified: Secondary | ICD-10-CM | POA: Insufficient documentation

## 2017-11-26 NOTE — Telephone Encounter (Signed)
ENT note reviewed He wants patient to see a GI doctor Referral entered

## 2017-11-26 NOTE — Assessment & Plan Note (Signed)
Evaluated by ENT; refer to GI

## 2017-12-21 ENCOUNTER — Ambulatory Visit (INDEPENDENT_AMBULATORY_CARE_PROVIDER_SITE_OTHER): Payer: Medicare Other | Admitting: Gastroenterology

## 2017-12-21 ENCOUNTER — Encounter: Payer: Self-pay | Admitting: Gastroenterology

## 2017-12-21 VITALS — BP 134/65 | HR 64 | Ht 65.0 in | Wt 135.4 lb

## 2017-12-21 DIAGNOSIS — K219 Gastro-esophageal reflux disease without esophagitis: Secondary | ICD-10-CM | POA: Diagnosis not present

## 2017-12-21 DIAGNOSIS — R1310 Dysphagia, unspecified: Secondary | ICD-10-CM

## 2017-12-21 MED ORDER — OMEPRAZOLE 20 MG PO CPDR: 20.0000 mg | DELAYED_RELEASE_CAPSULE | Freq: Every day | ORAL | 3 refills | Status: DC

## 2017-12-21 NOTE — Progress Notes (Signed)
Melissa Darby, MD [address]  [place]  [place], [place] [address]  Main: [phone]  Fax: [phone]    Gastroenterology Consultation  Referring Provider:     [name], MD Primary Care Physician:  [name], MD Primary Gastroenterologist:  Dr. Cephas Combs Reason for Consultation:     dysphagia        HPI:   Melissa Combs is a 84 y.o. female referred by Dr. [name], MD  for consultation & management of dysphagia. Patient has history of severe dementia, is accompanied by her caregiver. Patient lives in a group home. Her caregiver noticed that patient thumps on her chest every time she eats. But she does not choke or regurgitate. She denies any complaints with liquids. The current caregiver has been taking care of her for the past 1 year. She is giving her chopped food. She denies nocturnal awakenings due to chest pain or regurgitation. She did not lose weight. She is not on acid suppression medication. She has regular bowel movements. She is wondering if this is secondary to acid reflux or secondary to her progressive dementia.  NSAIDs: aspirin 81 and meloxicam daily  Antiplts/Anticoagulants/Anti thrombotics: aspirin 81  GI Procedures: none  Past Medical History:  Diagnosis Date  . Allergy   . Anemia   . Arthritis   . B12 deficiency 11/06/2015   Less than 400 October 2016  . Chronic constipation   . Dementia   . Dementia   . Family history of adverse reaction to anesthesia    brother, daughter and grandson had malignant hyperthermia in the past  . Glaucoma   . History of fibrocystic disease of breast    biopsy done, negative  . History of hand fracture    left  . History of shingles 2014  . History of wrist fracture    left  . Hypertension   . Impacted cerumen   . Pneumonia   . Postherpetic neuralgia   . TIA (transient ischemic attack) Feb 2016    Past Surgical History:  Procedure Laterality Date  . BREAST LUMPECTOMY     unsure which side  . CATARACT EXTRACTION W/PHACO Right 01/12/2017   Procedure: CATARACT EXTRACTION PHACO AND INTRAOCULAR LENS PLACEMENT (IOC);  Surgeon: Birder Robson, MD;  Location: [facility] ORS;  Service: Ophthalmology;  Laterality: Right;  [place] 1:05.8 AP% 26.0 CDE 17.07 Fluid pack lot # [id number]     Current Outpatient Medications:  .  acetaminophen (TYLENOL 8 HOUR ARTHRITIS PAIN) 650 MG CR tablet, Take 650 mg by mouth 3 (three) times daily as needed. , Disp: , Rfl:  .  amLODipine (NORVASC) 5 MG tablet, Take 1 tablet (5 mg total) by mouth daily., Disp: 90 tablet, Rfl: 3 .  aspirin EC 81 MG tablet, Take 1 tablet (81 mg total) by mouth daily., Disp: 30 tablet, Rfl: 11 .  Bismuth Subsalicylate 834 HD/62IW SUSP, Take 30 mLs (1,050 mg total) by mouth 2 (two) times daily as needed., Disp: , Rfl:  .  brimonidine-timolol (COMBIGAN) 0.2-0.5 % ophthalmic solution, Place 1 drop into both eyes 2 (two) times daily., Disp: , Rfl:  .  carvedilol (COREG) 6.25 MG tablet, TAKE ONE TABLET BY MOUTH TWO TIMES A DAY, Disp: 60 tablet, Rfl: 11 .  citalopram (CELEXA) 10 MG tablet, Take 1 tablet (10 mg total) by mouth daily., Disp: 30 tablet, Rfl: 11 .  Diapers & Supplies (HUGGIES PULL-UPS) MISC, 1 each by Does not apply route 3 (three)  times daily. Adult size medium, urinary and stool incontinence, LON 99 months, Disp: 100 each, Rfl: 5 .  diclofenac sodium (VOLTAREN) 1 % GEL, Apply 2 grams to each knee three times a day while awake, for example, 8 am, 2 pm, and 8 pm, Disp: 100 g, Rfl: 5 .  divalproex (DEPAKOTE SPRINKLES) 125 MG capsule, Take 250 mg by mouth 3 (three) times daily. , Disp: , Rfl:  .  donepezil (ARICEPT) 10 MG tablet, Take 1 tablet (10 mg total) by mouth at bedtime., Disp: 30 tablet, Rfl: 11 .  fluticasone (FLONASE) 50 MCG/ACT nasal spray, USE 2 SPRAYS IN BOTH NOSTRILS ONCE DAILYFOR RHINITIS, Disp: 16 g, Rfl: 11 .  latanoprost (XALATAN) 0.005 % ophthalmic solution, Place 1 drop into both eyes at bedtime.,  Disp: , Rfl:  .  lisinopril (PRINIVIL,ZESTRIL) 20 MG tablet, Take 1 tablet (20 mg total) by mouth daily., Disp: 30 tablet, Rfl: 3 .  meloxicam (MOBIC) 7.5 MG tablet, Take 1 tablet (7.5 mg total) by mouth daily. For 30 days only, Disp: 30 tablet, Rfl: 0 .  montelukast (SINGULAIR) 5 MG chewable tablet, CHEW 1 TABLET BY MOUTH AT BEDTIME, Disp: 30 tablet, Rfl: 11 .  NAMENDA XR 28 MG CP24 24 hr capsule, TAKE 1 CAPSULE BY MOUTH ONCE DAILY FOR MEMORY, Disp: 30 capsule, Rfl: 11 .  polyethylene glycol powder (GLYCOLAX/MIRALAX) powder, MIX 17 GR (1 CAPFUL) IN 8 OZ WATER/JUICEAND DRINK ONCE DAILY AS NEEDED TO FOR CONSTIPATION, Disp: 527 g, Rfl: 11 .  pravastatin (PRAVACHOL) 20 MG tablet, Take 1 tablet (20 mg total) by mouth at bedtime., Disp: 30 tablet, Rfl: 3 .  pregabalin (LYRICA) 25 MG capsule, 25 mg qhs every other night, Disp: 30 capsule, Rfl: 2 .  vitamin B-12 (CYANOCOBALAMIN) 500 MCG tablet, TAKE ONE TABLET BY MOUTH EVERY DAY, Disp: 28 tablet, Rfl: 12 .  omeprazole (PRILOSEC) 20 MG capsule, Take 1 capsule (20 mg total) by mouth daily., Disp: 30 capsule, Rfl: 3   Family History  Family history unknown: Yes     Social History   Tobacco Use  . Smoking status: Never Smoker  . Smokeless tobacco: Never Used  Substance Use Topics  . Alcohol use: No    Alcohol/week: 0.0 oz  . Drug use: No    Allergies as of 12/21/2017  . (No Known Allergies)    Review of Systems:    All systems reviewed and negative except where noted in HPI.   Physical Exam:  BP 134/65   Pulse 64   Ht 5\' 5"  (1.651 m)   Wt 135 lb 6.4 oz (61.4 kg)   BMI 22.53 kg/m  No LMP recorded. Patient is postmenopausal.  General:   Alert,  Well-developed, well-nourished, pleasant and cooperative in NAD Head:  Normocephalic and atraumatic. Eyes:  Sclera clear, no icterus.   Conjunctiva pink. Ears:  Normal auditory acuity. Nose:  No deformity, discharge, or lesions. Mouth:  No deformity or lesions,oropharynx pink & moist, no  evidence of thrush Neck:  Supple; no masses or thyromegaly. Lungs:  Respirations even and unlabored.  Clear throughout to auscultation.   No wheezes, crackles, or rhonchi. No acute distress. Heart:  Regular rate and rhythm; no murmurs, clicks, rubs, or gallops. Abdomen:  Normal bowel sounds. Soft, non-tender and non-distended without masses, hepatosplenomegaly or hernias noted.  No guarding or rebound tenderness.   Rectal: Not performed Msk:  Symmetrical without gross deformities. Good, equal movement & strength bilaterally. Pulses:  Normal pulses noted. Extremities:  No clubbing  or edema.  No cyanosis. Neurologic:  Alert and oriented x3;  grossly normal neurologically. Skin:  Intact without significant lesions or rashes. No jaundice. Lymph Nodes:  No significant cervical adenopathy. Psych:  Alert and cooperative. Normal mood and affect.  Imaging Studies: none  Assessment and Plan:   Vermont Marrs is a 84 y.o. female with history of dementia, with 1 year history of noticeable possible difficulty swallowing with solids. Differentials include acid reflux, peptic stricture or erosive esophagitis or ring. Given her age, recommend  - Trial of omeprazole 20 mg daily for 3 months - Barium esophagogram - EGD based on her clinical response and x-ray results   Follow up in 4 weeks   Melissa Darby, MD

## 2017-12-22 ENCOUNTER — Telehealth: Payer: Self-pay | Admitting: Gastroenterology

## 2017-12-22 NOTE — Telephone Encounter (Signed)
Joy from [facility] LVM that the patient reflux medication (order) wasn't called in. Please call them.

## 2017-12-22 NOTE — Telephone Encounter (Signed)
Pharmacist will notify Joy that this rx was received and is ready.  Thanks [employer]

## 2017-12-31 ENCOUNTER — Ambulatory Visit
Admission: RE | Admit: 2017-12-31 | Discharge: 2017-12-31 | Disposition: A | Discharge status: Home / Self Care | Payer: Medicare Other | Source: Ambulatory Visit | Attending: Gastroenterology | Admitting: Gastroenterology

## 2017-12-31 DIAGNOSIS — K219 Gastro-esophageal reflux disease without esophagitis: Secondary | ICD-10-CM | POA: Insufficient documentation

## 2017-12-31 DIAGNOSIS — K225 Diverticulum of esophagus, acquired: Secondary | ICD-10-CM | POA: Diagnosis not present

## 2018-01-05 ENCOUNTER — Ambulatory Visit: Payer: Medicare Other | Admitting: Family Medicine

## 2018-01-07 ENCOUNTER — Ambulatory Visit: Payer: Medicare Other | Admitting: Family Medicine

## 2018-01-20 DIAGNOSIS — H401131 Primary open-angle glaucoma, bilateral, mild stage: Secondary | ICD-10-CM | POA: Diagnosis not present

## 2018-01-27 ENCOUNTER — Ambulatory Visit
Payer: Medicare Other | Attending: Student in an Organized Health Care Education/Training Program | Admitting: Student in an Organized Health Care Education/Training Program

## 2018-01-27 ENCOUNTER — Other Ambulatory Visit: Payer: Self-pay

## 2018-01-27 ENCOUNTER — Encounter: Payer: Self-pay | Admitting: Student in an Organized Health Care Education/Training Program

## 2018-01-27 VITALS — BP 132/56 | HR 61 | Temp 97.1°F | Resp 18 | Ht 65.0 in | Wt 139.0 lb

## 2018-01-27 DIAGNOSIS — R32 Unspecified urinary incontinence: Secondary | ICD-10-CM | POA: Insufficient documentation

## 2018-01-27 DIAGNOSIS — Z9889 Other specified postprocedural states: Secondary | ICD-10-CM | POA: Insufficient documentation

## 2018-01-27 DIAGNOSIS — D649 Anemia, unspecified: Secondary | ICD-10-CM | POA: Insufficient documentation

## 2018-01-27 DIAGNOSIS — M1990 Unspecified osteoarthritis, unspecified site: Secondary | ICD-10-CM | POA: Insufficient documentation

## 2018-01-27 DIAGNOSIS — M5136 Other intervertebral disc degeneration, lumbar region: Secondary | ICD-10-CM | POA: Insufficient documentation

## 2018-01-27 DIAGNOSIS — Z13820 Encounter for screening for osteoporosis: Secondary | ICD-10-CM | POA: Insufficient documentation

## 2018-01-27 DIAGNOSIS — N183 Chronic kidney disease, stage 3 (moderate): Secondary | ICD-10-CM | POA: Diagnosis not present

## 2018-01-27 DIAGNOSIS — K225 Diverticulum of esophagus, acquired: Secondary | ICD-10-CM | POA: Diagnosis not present

## 2018-01-27 DIAGNOSIS — G894 Chronic pain syndrome: Secondary | ICD-10-CM | POA: Insufficient documentation

## 2018-01-27 DIAGNOSIS — E785 Hyperlipidemia, unspecified: Secondary | ICD-10-CM | POA: Insufficient documentation

## 2018-01-27 DIAGNOSIS — Z791 Long term (current) use of non-steroidal anti-inflammatories (NSAID): Secondary | ICD-10-CM | POA: Insufficient documentation

## 2018-01-27 DIAGNOSIS — F0390 Unspecified dementia without behavioral disturbance: Secondary | ICD-10-CM | POA: Diagnosis not present

## 2018-01-27 DIAGNOSIS — J309 Allergic rhinitis, unspecified: Secondary | ICD-10-CM | POA: Diagnosis not present

## 2018-01-27 DIAGNOSIS — K5909 Other constipation: Secondary | ICD-10-CM | POA: Diagnosis not present

## 2018-01-27 DIAGNOSIS — E871 Hypo-osmolality and hyponatremia: Secondary | ICD-10-CM | POA: Insufficient documentation

## 2018-01-27 DIAGNOSIS — M1710 Unilateral primary osteoarthritis, unspecified knee: Secondary | ICD-10-CM | POA: Diagnosis not present

## 2018-01-27 DIAGNOSIS — G459 Transient cerebral ischemic attack, unspecified: Secondary | ICD-10-CM | POA: Diagnosis not present

## 2018-01-27 DIAGNOSIS — B0229 Other postherpetic nervous system involvement: Secondary | ICD-10-CM | POA: Insufficient documentation

## 2018-01-27 DIAGNOSIS — E878 Other disorders of electrolyte and fluid balance, not elsewhere classified: Secondary | ICD-10-CM | POA: Insufficient documentation

## 2018-01-27 DIAGNOSIS — I491 Atrial premature depolarization: Secondary | ICD-10-CM | POA: Diagnosis not present

## 2018-01-27 DIAGNOSIS — H409 Unspecified glaucoma: Secondary | ICD-10-CM | POA: Insufficient documentation

## 2018-01-27 DIAGNOSIS — I129 Hypertensive chronic kidney disease with stage 1 through stage 4 chronic kidney disease, or unspecified chronic kidney disease: Secondary | ICD-10-CM | POA: Insufficient documentation

## 2018-01-27 DIAGNOSIS — K219 Gastro-esophageal reflux disease without esophagitis: Secondary | ICD-10-CM | POA: Insufficient documentation

## 2018-01-27 DIAGNOSIS — Z7982 Long term (current) use of aspirin: Secondary | ICD-10-CM | POA: Insufficient documentation

## 2018-01-27 DIAGNOSIS — M170 Bilateral primary osteoarthritis of knee: Secondary | ICD-10-CM | POA: Diagnosis not present

## 2018-01-27 DIAGNOSIS — E538 Deficiency of other specified B group vitamins: Secondary | ICD-10-CM | POA: Insufficient documentation

## 2018-01-27 DIAGNOSIS — M5481 Occipital neuralgia: Secondary | ICD-10-CM | POA: Insufficient documentation

## 2018-01-27 DIAGNOSIS — R600 Localized edema: Secondary | ICD-10-CM | POA: Insufficient documentation

## 2018-01-27 DIAGNOSIS — Z79899 Other long term (current) drug therapy: Secondary | ICD-10-CM | POA: Insufficient documentation

## 2018-01-27 NOTE — Progress Notes (Signed)
Patient's Name: Melissa Combs  MRN: [id number]  Referring Provider: [name], MD  DOB: 1930-03-25  PCP: [name], MD  DOS: 01/27/2018  Note by: Gillis Santa, MD  Service setting: Ambulatory outpatient  Specialty: Interventional Pain Management  Location: [facility] (AMB) Pain Management Facility    Patient type: Established   Primary Reason(s) for Visit: Encounter for prescription drug management. (Level of risk: moderate)  CC: Knee Pain and Toe Pain  HPI  Melissa Combs is a 84 y.o. year old, female patient, who comes today for a medication management evaluation. She has Post herpetic neuralgia head; Bilateral occipital neuralgia; DDD (degenerative disc disease), lumbar; DJD (degenerative joint disease) of knee; Glaucoma; Hypertension; Dementia; TIA (transient ischemic attack); Anemia; Arthritis; Chronic constipation; Postherpetic neuralgia; CKD (chronic kidney disease) stage 3, GFR 30-59 ml/min (HCC); Dyslipidemia; Medication monitoring encounter; Bilateral leg edema; Hypochloremia; Hyponatremia; Osteoporosis screening; B12 deficiency; Allergic rhinitis; Urinary incontinence; Chronic pain syndrome; PAC (premature atrial contraction); and Dysphagia on their problem list. Her primarily concern today is the Knee Pain and Toe Pain  Pain Assessment: Location:   Knee(toes) Radiating:   Onset: (unable to assess) Duration:   Quality: (unable to assess) Severity: 0-No pain/10 (subjective, self-reported pain score)  Note: Reported level is compatible with observation.                         When using our objective Pain Scale, levels between 6 and 10/10 are said to belong in an emergency room, as it progressively worsens from a 6/10, described as severely limiting, requiring emergency care not usually available at an outpatient pain management facility. At a 6/10 level, communication becomes difficult and requires great effort. Assistance to reach the emergency department may be required. Facial  flushing and profuse sweating along with potentially dangerous increases in heart rate and blood pressure will be evident. Effect on ADL:   Timing: (unable to assess) Modifying factors:   BP: (!) 132/56  HR: 61  Melissa Combs was last scheduled for an appointment on 11/11/2017 for medication management. During today's appointment we reviewed Melissa Combs's chronic pain status, as well as her outpatient medication regimen.  The patient  reports that she does not use drugs. Her body mass index is 23.13 kg/m.  Patient comes in with caregiver.  Has severe dementia.  Is not oriented to place or time.  Endorses pain in her right foot and b/l ankles.    Further details on both, my assessment(s), as well as the proposed treatment plan, please see below.  Laboratory Chemistry  Inflammation Markers (CRP: Acute Phase) (ESR: Chronic Phase) No results found for: CRP, ESRSEDRATE, LATICACIDVEN                       Rheumatology Markers No results found for: RF, ANA, LABURIC, URICUR, LYMEIGGIGMAB, LYMEABIGMQN, HLAB27                      Renal Function Markers Lab Results  Component Value Date   BUN 16 09/02/2017   CREATININE 0.71 09/02/2017   BCR 15 11/01/2015   GFRAA >60 09/02/2017   GFRNONAA >60 09/02/2017                              Hepatic Function Markers Lab Results  Component Value Date   AST 14 09/02/2016   ALT 3 (L) 03/11/2017   ALBUMIN  4.1 09/02/2016   ALKPHOS 47 09/02/2016                        Electrolytes Lab Results  Component Value Date   NA 138 09/02/2017   K 4.7 09/02/2017   CL 95 (L) 09/02/2017   CALCIUM 9.5 09/02/2017                        Neuropathy Markers Lab Results  Component Value Date   VITAMINB12 1,036 09/02/2016                        Bone Pathology Markers Lab Results  Component Value Date   VD25OH 40.7 06/27/2015                         Coagulation Parameters Lab Results  Component Value Date   PLT 216 09/02/2017                         Cardiovascular Markers Lab Results  Component Value Date   TROPONINI <0.03 09/02/2017   HGB 11.2 (L) 09/02/2017   HCT 34.5 (L) 09/02/2017                         CA Markers No results found for: CEA, CA125, LABCA2                      Note: Lab results reviewed.  Recent Diagnostic Imaging Results  DG Esophagus CLINICAL DATA:  Dysphagia  EXAM: ESOPHOGRAM / BARIUM SWALLOW / BARIUM TABLET STUDY  TECHNIQUE: Combined double contrast and single contrast examination performed using effervescent crystals, thick barium liquid, and thin barium liquid. The patient was observed with fluoroscopy swallowing a 13 mm barium sulphate tablet.  FLUOROSCOPY TIME:  Fluoroscopy Time:  1 minutes 6 seconds  Radiation Exposure Index (if provided by the fluoroscopic device): 7.2 mGy  Number of Acquired Spot Images: 0  COMPARISON:  None.  FINDINGS: There was normal pharyngeal anatomy and motility. Contrast flowed freely through the esophagus without evidence of stricture or mass. There was normal esophageal mucosa without evidence of irregularity or ulceration. Prominent cricopharyngeus muscle with a small developing Zenker's diverticulum. Mild gastroesophageal reflux. No definite hiatal hernia was demonstrated.  At the end of the examination a 13 mm barium tablet was administered which transited through the esophagus and esophagogastric junction without delay.  IMPRESSION: 1. Prominent cricopharyngeus muscle with a small developing Zenker's diverticulum. 2. Mild gastroesophageal reflux.  Electronically Signed   By: Kathreen Devoid   On: 12/31/2017 10:57  Complexity Note: Imaging results reviewed. Results shared with Melissa Combs, using Layman's terms.                         Meds   Current Outpatient Medications:  .  acetaminophen (TYLENOL 8 HOUR ARTHRITIS PAIN) 650 MG CR tablet, Take 650 mg by mouth 3 (three) times daily as needed. , Disp: , Rfl:  .  amLODipine (NORVASC) 5 MG  tablet, Take 1 tablet (5 mg total) by mouth daily., Disp: 90 tablet, Rfl: 3 .  aspirin EC 81 MG tablet, Take 1 tablet (81 mg total) by mouth daily., Disp: 30 tablet, Rfl: 11 .  Bismuth Subsalicylate 962 EZ/66QH SUSP, Take 30 mLs (1,050 mg total) by mouth 2 (  two) times daily as needed., Disp: , Rfl:  .  brimonidine-timolol (COMBIGAN) 0.2-0.5 % ophthalmic solution, Place 1 drop into both eyes 2 (two) times daily., Disp: , Rfl:  .  carvedilol (COREG) 6.25 MG tablet, TAKE ONE TABLET BY MOUTH TWO TIMES A DAY, Disp: 60 tablet, Rfl: 11 .  citalopram (CELEXA) 10 MG tablet, Take 1 tablet (10 mg total) by mouth daily., Disp: 30 tablet, Rfl: 11 .  Diapers & Supplies (HUGGIES PULL-UPS) MISC, 1 each by Does not apply route 3 (three) times daily. Adult size medium, urinary and stool incontinence, LON 99 months, Disp: 100 each, Rfl: 5 .  diclofenac sodium (VOLTAREN) 1 % GEL, Apply 2 grams to each knee three times a day while awake, for example, 8 am, 2 pm, and 8 pm, Disp: 100 g, Rfl: 5 .  divalproex (DEPAKOTE SPRINKLES) 125 MG capsule, Take 250 mg by mouth 3 (three) times daily. , Disp: , Rfl:  .  donepezil (ARICEPT) 10 MG tablet, Take 1 tablet (10 mg total) by mouth at bedtime., Disp: 30 tablet, Rfl: 11 .  fluticasone (FLONASE) 50 MCG/ACT nasal spray, USE 2 SPRAYS IN BOTH NOSTRILS ONCE DAILYFOR RHINITIS, Disp: 16 g, Rfl: 11 .  latanoprost (XALATAN) 0.005 % ophthalmic solution, Place 1 drop into both eyes at bedtime., Disp: , Rfl:  .  lisinopril (PRINIVIL,ZESTRIL) 20 MG tablet, Take 1 tablet (20 mg total) by mouth daily., Disp: 30 tablet, Rfl: 3 .  montelukast (SINGULAIR) 5 MG chewable tablet, CHEW 1 TABLET BY MOUTH AT BEDTIME, Disp: 30 tablet, Rfl: 11 .  NAMENDA XR 28 MG CP24 24 hr capsule, TAKE 1 CAPSULE BY MOUTH ONCE DAILY FOR MEMORY, Disp: 30 capsule, Rfl: 11 .  omeprazole (PRILOSEC) 20 MG capsule, Take 1 capsule (20 mg total) by mouth daily., Disp: 30 capsule, Rfl: 3 .  polyethylene glycol powder  (GLYCOLAX/MIRALAX) powder, MIX 17 GR (1 CAPFUL) IN 8 OZ WATER/JUICEAND DRINK ONCE DAILY AS NEEDED TO FOR CONSTIPATION, Disp: 527 g, Rfl: 11 .  pravastatin (PRAVACHOL) 20 MG tablet, Take 1 tablet (20 mg total) by mouth at bedtime., Disp: 30 tablet, Rfl: 3 .  pregabalin (LYRICA) 25 MG capsule, 25 mg qhs every other night, Disp: 30 capsule, Rfl: 2 .  vitamin B-12 (CYANOCOBALAMIN) 500 MCG tablet, TAKE ONE TABLET BY MOUTH EVERY DAY, Disp: 28 tablet, Rfl: 12 .  meloxicam (MOBIC) 7.5 MG tablet, Take 1 tablet (7.5 mg total) by mouth daily. For 30 days only (Patient not taking: Reported on 01/27/2018), Disp: 30 tablet, Rfl: 0  ROS  Constitutional: Denies any fever or chills Gastrointestinal: No reported hemesis, hematochezia, vomiting, or acute GI distress Musculoskeletal: Denies any acute onset joint swelling, redness, loss of ROM, or weakness Neurological: No reported episodes of acute onset apraxia, aphasia, dysarthria, agnosia, amnesia, paralysis, loss of coordination, or loss of consciousness  Allergies  Melissa Combs has No Known Allergies.  PFSH  Drug: Melissa Combs  reports that she does not use drugs. Alcohol:  reports that she does not drink alcohol. Tobacco:  reports that she has never smoked. She has never used smokeless tobacco. Medical:  has a past medical history of Allergy, Anemia, Arthritis, B12 deficiency (11/06/2015), Chronic constipation, Dementia, Dementia, Family history of adverse reaction to anesthesia, Glaucoma, History of fibrocystic disease of breast, History of hand fracture, History of shingles (2014), History of wrist fracture, Hypertension, Impacted cerumen, Pneumonia, Postherpetic neuralgia, and TIA (transient ischemic attack) (Feb 2016). Surgical: Melissa Combs  has a past surgical history that includes  Breast lumpectomy and Cataract extraction w/PHACO (Right, 01/12/2017). Family: Family history is unknown by patient.  Constitutional Exam  General appearance: cooperative, slowed  mentation and disoriented Vitals:   01/27/18 1056 01/27/18 1058  BP:  (!) 132/56  Pulse:  61  Resp: 18 18  Temp:  (!) 97.1 F (36.2 C)  SpO2:  99%  Weight: 139 lb (63 kg)   Height: 5' 5" (1.651 m)    BMI Assessment: Estimated body mass index is 23.13 kg/m as calculated from the following:   Height as of this encounter: 5' 5" (1.651 m).   Weight as of this encounter: 139 lb (63 kg).  BMI interpretation table: BMI level Category Range association with higher incidence of chronic pain  <18 kg/m2 Underweight   18.5-24.9 kg/m2 Ideal body weight   25-29.9 kg/m2 Overweight Increased incidence by 20%  30-34.9 kg/m2 Obese (Class I) Increased incidence by 68%  35-39.9 kg/m2 Severe obesity (Class II) Increased incidence by 136%  >40 kg/m2 Extreme obesity (Class III) Increased incidence by 254%   Patient's current BMI Ideal Body weight  Body mass index is 23.13 kg/m. Ideal body weight: 57 kg (125 lb 10.6 oz) Adjusted ideal body weight: 59.4 kg (131 lb)   BMI Readings from Last 4 Encounters:  01/27/18 23.13 kg/m  12/21/17 22.53 kg/m  11/04/17 23.13 kg/m  10/22/17 23.13 kg/m   Wt Readings from Last 4 Encounters:  01/27/18 139 lb (63 kg)  12/21/17 135 lb 6.4 oz (61.4 kg)  11/04/17 139 lb (63 kg)  10/22/17 139 lb (63 kg)  Psych/Mental status: Alert, oriented x 3 (person, place, & time)       Eyes: PERLA Respiratory: No evidence of acute respiratory distress  Cervical Spine Area Exam  Skin & Axial Inspection: No masses, redness, edema, swelling, or associated skin lesions Alignment: Symmetrical Functional ROM: Unrestricted ROM      Stability: No instability detected Muscle Tone/Strength: Functionally intact. No obvious neuro-muscular anomalies detected. Sensory (Neurological): Unimpaired Palpation: No palpable anomalies              Upper Extremity (UE) Exam    Side: Right upper extremity  Side: Left upper extremity  Skin & Extremity Inspection: Skin color, temperature,  and hair growth are WNL. No peripheral edema or cyanosis. No masses, redness, swelling, asymmetry, or associated skin lesions. No contractures.  Skin & Extremity Inspection: Skin color, temperature, and hair growth are WNL. No peripheral edema or cyanosis. No masses, redness, swelling, asymmetry, or associated skin lesions. No contractures.  Functional ROM: Unrestricted ROM          Functional ROM: Unrestricted ROM          Muscle Tone/Strength: Functionally intact. No obvious neuro-muscular anomalies detected.  Muscle Tone/Strength: Functionally intact. No obvious neuro-muscular anomalies detected.  Sensory (Neurological): Unimpaired          Sensory (Neurological): Unimpaired          Palpation: No palpable anomalies              Palpation: No palpable anomalies              Provocative Test(s):  Phalen's test: deferred Tinel's test: deferred Apley's scratch test (touch opposite shoulder):  Action 1 (Across chest): deferred Action 2 (Overhead): deferred Action 3 (LB reach): deferred   Provocative Test(s):  Phalen's test: deferred Tinel's test: deferred Apley's scratch test (touch opposite shoulder):  Action 1 (Across chest): deferred Action 2 (Overhead): deferred Action 3 (LB reach): deferred  Thoracic Spine Area Exam  Skin & Axial Inspection: No masses, redness, or swelling Alignment: Symmetrical Functional ROM: Unrestricted ROM Stability: No instability detected Muscle Tone/Strength: Functionally intact. No obvious neuro-muscular anomalies detected. Sensory (Neurological): Unimpaired Muscle strength & Tone: No palpable anomalies  Lumbar Spine Area Exam  Skin & Axial Inspection: No masses, redness, or swelling Alignment: Symmetrical Functional ROM: Unrestricted ROM       Stability: No instability detected Muscle Tone/Strength: Functionally intact. No obvious neuro-muscular anomalies detected. Sensory (Neurological): Unimpaired Palpation: No palpable anomalies        Provocative Tests: Lumbar Hyperextension/rotation test: deferred today       Lumbar quadrant test (Kemp's test): deferred today       Lumbar Lateral bending test: deferred today       Patrick's Maneuver: deferred today                   FABER test: deferred today       Thigh-thrust test: deferred today       S-I compression test: deferred today       S-I distraction test: deferred today         Gait & Posture Assessment  Ambulation:Patient came in today in a wheel chair Gait:Very limited, using assistive device to ambulate Posture:Kyphosis-lordosis   Lower Extremity Exam    Side: Right lower extremity  Side: Left lower extremity  Stability: No instability observed          Stability: No instability observed          Skin & Extremity Inspection: Skin color, temperature, and hair growth are WNL. No peripheral edema or cyanosis. No masses, redness, swelling, asymmetry, or associated skin lesions. No contractures.  Skin & Extremity Inspection: Skin color, temperature, and hair growth are WNL. No peripheral edema or cyanosis. No masses, redness, swelling, asymmetry, or associated skin lesions. No contractures.  Functional ROM: Unrestricted ROM                  Functional ROM: Unrestricted ROM                  Muscle Tone/Strength: Functionally intact. No obvious neuro-muscular anomalies detected.  Muscle Tone/Strength: Functionally intact. No obvious neuro-muscular anomalies detected.  Sensory (Neurological): Unimpaired  Sensory (Neurological): Unimpaired  Palpation: No palpable anomalies  Palpation: No palpable anomalies   Assessment  Primary Diagnosis & Pertinent Problem List: The primary encounter diagnosis was Chronic pain syndrome. Diagnoses of Postherpetic neuralgia, Primary osteoarthritis of both knees, DDD (degenerative disc disease), lumbar, and Bilateral occipital neuralgia were also pertinent to this visit.  Status Diagnosis  Persistent Controlled Worsening 1. Chronic  pain syndrome   2. Postherpetic neuralgia   3. Primary osteoarthritis of both knees   4. DDD (degenerative disc disease), lumbar   5. Bilateral occipital neuralgia      84 year old female with a history of severe dementia with chronic pain secondary to primary osteoarthritis of bilateral knees and lumbar degenerative disc disease.  Patient with severe dementia so most HPI was obtained from caregiver which was limited in scope. I have instructed the patient to continue Tylenol 650 mg 3 times daily as needed for breakthrough pain.  Recommended that she not utilize any NSAIDs.  In the future, if patient is interested in knee injections for knee osteoarthritis can perform those.  Follow-up PRN.  Provider-requested follow-up: Return if symptoms worsen or fail to improve.  Future Appointments  Date Time Provider [place]  02/03/2018  9:40 AM  [name], MD [place] [facility]  02/04/2018 11:00 AM Vanga, Tally Due, MD [facility] None    Primary Care Physician: [name], MD Location: [facility] Note by: Gillis Santa, M.D Date: 01/27/2018; Time: 12:05 PM  There are no Patient Instructions on file for this visit.

## 2018-02-03 ENCOUNTER — Telehealth: Payer: Self-pay | Admitting: Family Medicine

## 2018-02-03 ENCOUNTER — Ambulatory Visit (INDEPENDENT_AMBULATORY_CARE_PROVIDER_SITE_OTHER): Payer: Medicare Other | Admitting: Family Medicine

## 2018-02-03 ENCOUNTER — Encounter: Payer: Self-pay | Admitting: Family Medicine

## 2018-02-03 VITALS — BP 132/80 | HR 62 | Temp 98.3°F | Resp 16 | Ht 65.0 in | Wt 130.2 lb

## 2018-02-03 DIAGNOSIS — R600 Localized edema: Secondary | ICD-10-CM | POA: Diagnosis not present

## 2018-02-03 DIAGNOSIS — B0229 Other postherpetic nervous system involvement: Secondary | ICD-10-CM

## 2018-02-03 DIAGNOSIS — E538 Deficiency of other specified B group vitamins: Secondary | ICD-10-CM | POA: Diagnosis not present

## 2018-02-03 DIAGNOSIS — E785 Hyperlipidemia, unspecified: Secondary | ICD-10-CM

## 2018-02-03 DIAGNOSIS — F0390 Unspecified dementia without behavioral disturbance: Secondary | ICD-10-CM

## 2018-02-03 DIAGNOSIS — Z23 Encounter for immunization: Secondary | ICD-10-CM | POA: Diagnosis not present

## 2018-02-03 DIAGNOSIS — D62 Acute posthemorrhagic anemia: Secondary | ICD-10-CM | POA: Diagnosis not present

## 2018-02-03 DIAGNOSIS — I10 Essential (primary) hypertension: Secondary | ICD-10-CM

## 2018-02-03 DIAGNOSIS — E878 Other disorders of electrolyte and fluid balance, not elsewhere classified: Secondary | ICD-10-CM

## 2018-02-03 MED ORDER — MEDICAL COMPRESSION STOCKINGS MISC
0 refills | Status: AC
Start: 2018-02-03 — End: ?

## 2018-02-03 NOTE — Assessment & Plan Note (Signed)
Check level and see if shots needed

## 2018-02-03 NOTE — Assessment & Plan Note (Signed)
Check today 

## 2018-02-03 NOTE — Telephone Encounter (Signed)
Copied from [place] [phone]. Topic: Quick Communication - Rx Refill/Question >> Feb 03, 2018 11:37 AM Scherrie Gerlach wrote: Medication: open toe compression socks Instructions "Open toes, Wear from morning until bedtime, remove for sleep"  Fax [phone] Pt saw the dr this am and was sent with instructions for open toes comp socks.  But Joy with [facility] states they need a written rx faxed to them

## 2018-02-03 NOTE — Assessment & Plan Note (Signed)
Not seeing pain clinic any more

## 2018-02-03 NOTE — Progress Notes (Signed)
BP 132/80 (BP Location: Left Arm, Patient Position: Sitting, Cuff Size: Normal)   Pulse 62   Temp 98.3 F (36.8 C) (Oral)   Resp 16   Ht 5\' 5"  (1.651 m)   Wt 130 lb 3.2 oz (59.1 kg)   SpO2 96%   BMI 21.67 kg/m    Subjective:    Patient ID: Melissa Combs, female    DOB: 03-Jun-1930, 84 y.o.   MRN: [id number]  HPI: Melissa Combs is a 84 y.o. female  Chief Complaint  Patient presents with  . Medication Refill    4 month F/U  . Hypertension    HPI Patient is here for f/u She has hypertension; controlled; caregiver says they try to use just a pinch of salt when cooking; not taking more at the table She is not going to the pain clinic for her post-zoster neuralgia anymore; her pain is intermittent they said and not doing the shots ongoing; no point for her to come back they said; stopped her injections and follow-up Dementia; lives in group home, requiring 24/7 supervision; stable says caregiver; eating well; sometimes fights (not physically, just argumentative, says she doesn't have money to pay them) when eating, drinking and taking medicines; they take her to the bathroom and leave her to do her business and wipes her; she washes her hands by herself; bathing requires help; they have to put on her clothes, no fighting with that Not wearing the compression stockings because she complains about pain all day when she wears the stockings; now wearing soft socks; toes hurt  Hyperlipidemia Lab Results  Component Value Date   CHOL 202 (H) 03/11/2017   HDL 71 03/11/2017   LDLCALC 118 (H) 03/11/2017   TRIG 63 03/11/2017   CHOLHDL 2.8 03/11/2017  Anemia of chronic disease; last H/H improved from 9 months ago She saw the eye doctor recently; blinks frequently from the old shingles; using drops  Depression screen [facility] 2/9 02/03/2018 01/27/2018 11/04/2017 09/07/2017 08/05/2017  Decreased Interest 0 0 0 0 0  Down, Depressed, Hopeless 0 0 0 0 0  PHQ - 2 Score 0 0 0 0 0  Altered sleeping - - -  - -  Tired, decreased energy - - - - -  Change in appetite - - - - -  Feeling bad or failure about yourself  - - - - -  Trouble concentrating - - - - -  Moving slowly or fidgety/restless - - - - -  Suicidal thoughts - - - - -  PHQ-9 Score - - - - -    Relevant past medical, surgical, family and social history reviewed Past Medical History:  Diagnosis Date  . Allergy   . Anemia   . Arthritis   . B12 deficiency 11/06/2015   Less than 400 October 2016  . Chronic constipation   . Dementia   . Dementia   . Family history of adverse reaction to anesthesia    brother, daughter and grandson had malignant hyperthermia in the past  . Glaucoma   . History of fibrocystic disease of breast    biopsy done, negative  . History of hand fracture    left  . History of shingles 2014  . History of wrist fracture    left  . Hypertension   . Impacted cerumen   . Pneumonia   . Postherpetic neuralgia   . TIA (transient ischemic attack) Feb 2016   Past Surgical History:  Procedure Laterality Date  . BREAST  LUMPECTOMY     unsure which side  . CATARACT EXTRACTION W/PHACO Right 01/12/2017   Procedure: CATARACT EXTRACTION PHACO AND INTRAOCULAR LENS PLACEMENT (IOC);  Surgeon: Birder Robson, MD;  Location: [facility] ORS;  Service: Ophthalmology;  Laterality: Right;  [place] 1:05.8 AP% 26.0 CDE 17.07 Fluid pack lot # [id number]   Family History  Family history unknown: Yes   Social History   Tobacco Use  . Smoking status: Never Smoker  . Smokeless tobacco: Never Used  Substance Use Topics  . Alcohol use: No    Alcohol/week: 0.0 oz  . Drug use: No    Interim medical history since last visit reviewed. Allergies and medications reviewed  Review of Systems Per HPI unless specifically indicated above     Objective:    BP 132/80 (BP Location: Left Arm, Patient Position: Sitting, Cuff Size: Normal)   Pulse 62   Temp 98.3 F (36.8 C) (Oral)   Resp 16   Ht 5\' 5"  (1.651 m)   Wt 130 lb 3.2 oz  (59.1 kg)   SpO2 96%   BMI 21.67 kg/m   Wt Readings from Last 3 Encounters:  02/03/18 130 lb 3.2 oz (59.1 kg)  01/27/18 139 lb (63 kg)  12/21/17 135 lb 6.4 oz (61.4 kg)  MD note: doubt veracity of last weight  Physical Exam  Constitutional: She appears well-developed and well-nourished.  Seated in wheelchair, gait not assessed  HENT:  Mouth/Throat: Mucous membranes are normal.  Eyes: EOM are normal. No scleral icterus.  Cardiovascular: Normal rate and regular rhythm.  Pulmonary/Chest: Effort normal and breath sounds normal.  Musculoskeletal: She exhibits edema (line in the legs from sock elastic; not wearing compression stockings).  Neurological: She is alert. She displays no tremor.  Psychiatric: She has a normal mood and affect. Her mood appears not anxious. She is not agitated and not combative. Cognition and memory are impaired. She does not exhibit a depressed mood. She exhibits abnormal recent memory.    Results for orders placed or performed during the hospital encounter of 14/78/29  Basic metabolic panel  Result Value Ref Range   Sodium 138 135 - 145 mmol/L   Potassium 4.7 3.5 - 5.1 mmol/L   Chloride 95 (L) 101 - 111 mmol/L   CO2 35 (H) 22 - 32 mmol/L   Glucose, Bld 89 65 - 99 mg/dL   BUN 16 6 - 20 mg/dL   Creatinine, Ser 0.71 0.44 - 1.00 mg/dL   Calcium 9.5 8.9 - 10.3 mg/dL   GFR calc non Af Amer >60 >60 mL/min   GFR calc Af Amer >60 >60 mL/min   Anion gap 8 5 - 15  CBC  Result Value Ref Range   WBC 11.3 (H) 3.6 - 11.0 K/uL   RBC 3.85 3.80 - 5.20 MIL/uL   Hemoglobin 11.2 (L) 12.0 - 16.0 g/dL   HCT 34.5 (L) 35.0 - 47.0 %   MCV 89.7 80.0 - 100.0 fL   MCH 29.1 26.0 - 34.0 pg   MCHC 32.5 32.0 - 36.0 g/dL   RDW 14.3 11.5 - 14.5 %   Platelets 216 150 - 440 K/uL  Troponin I  Result Value Ref Range   Troponin I <0.03 <0.03 ng/mL      Assessment & Plan:   Problem List Items Addressed This Visit      Cardiovascular and Mediastinum   Hypertension    Controlled  today; no added salt for meals        Nervous and  Auditory   Post herpetic neuralgia head    Not seeing pain clinic any more      Dementia    With some verbal argumentative behavior when eating; eating and drinking well; requiring 24/7 supervision; she cannot dose any of her own medicine; requires care; no wandering behavior        Other   Anemia   Relevant Orders   CBC with Differential/Platelet   Hypochloremia    Check today      Relevant Orders   COMPLETE METABOLIC PANEL WITH GFR   Dyslipidemia    Check lipids      Relevant Orders   Lipid panel   Bilateral leg edema - Primary    Compression stockings with holes in the toes to allow for less symptomatic compression at the toes; still needs compression above ankles      B12 deficiency    Check level and see if shots needed      Relevant Orders   Vitamin B12    Other Visit Diagnoses    Need for vaccination with 13-polyvalent pneumococcal conjugate vaccine       Relevant Orders   Pneumococcal conjugate vaccine 13-valent IM (Completed)       Follow up plan: Return in about 6 months (around 08/05/2018) for follow-up visit with Dr. Sanda Klein; flu vaccine in late September.  An after-visit summary was printed and given to the patient at [facility].  Please see the patient instructions which may contain other information and recommendations beyond what is mentioned above in the assessment and plan.  Meds ordered this encounter  Medications  . Elastic Bandages & Supports (MEDICAL COMPRESSION STOCKINGS) MISC    Sig: Put stockings on every morning and remove every night; do not sleep in stockings; get the kind with open toes    Dispense:  2 each    Refill:  0    Orders Placed This Encounter  Procedures  . Pneumococcal conjugate vaccine 13-valent IM  . COMPLETE METABOLIC PANEL WITH GFR  . CBC with Differential/Platelet  . Lipid panel  . Vitamin B12

## 2018-02-03 NOTE — Assessment & Plan Note (Signed)
Compression stockings with holes in the toes to allow for less symptomatic compression at the toes; still needs compression above ankles

## 2018-02-03 NOTE — Assessment & Plan Note (Signed)
Controlled today; no added salt for meals

## 2018-02-03 NOTE — Assessment & Plan Note (Signed)
Check lipids 

## 2018-02-03 NOTE — Assessment & Plan Note (Signed)
With some verbal argumentative behavior when eating; eating and drinking well; requiring 24/7 supervision; she cannot dose any of her own medicine; requires care; no wandering behavior

## 2018-02-03 NOTE — Patient Instructions (Addendum)
In the future, please do let her have her medicines if she is coming fasting for labs It is okay for her to drink water or black coffee, just no calories Let's get labs today Take her back to the eye doctor She received the PCV-13 today She will not need another booster of any pneumonia shots for the rest of her life

## 2018-02-04 ENCOUNTER — Encounter: Payer: Self-pay | Admitting: Gastroenterology

## 2018-02-04 ENCOUNTER — Other Ambulatory Visit: Payer: Self-pay

## 2018-02-04 ENCOUNTER — Ambulatory Visit (INDEPENDENT_AMBULATORY_CARE_PROVIDER_SITE_OTHER): Payer: Medicare Other | Admitting: Gastroenterology

## 2018-02-04 VITALS — BP 146/61 | HR 58 | Ht 65.0 in | Wt 130.0 lb

## 2018-02-04 DIAGNOSIS — R1310 Dysphagia, unspecified: Secondary | ICD-10-CM

## 2018-02-04 LAB — CBC WITH DIFFERENTIAL/PLATELET
Basophils Absolute: 40 cells/uL (ref 0–200)
Basophils Relative: 0.5 %
EOS ABS: 128 {cells}/uL (ref 15–500)
EOS PCT: 1.6 %
HEMATOCRIT: 32.5 % — AB (ref 35.0–45.0)
HEMOGLOBIN: 10.6 g/dL — AB (ref 11.7–15.5)
LYMPHS ABS: 2176 {cells}/uL (ref 850–3900)
MCH: 29 pg (ref 27.0–33.0)
MCHC: 32.6 g/dL (ref 32.0–36.0)
MCV: 89 fL (ref 80.0–100.0)
MPV: 10.5 fL (ref 7.5–12.5)
Monocytes Relative: 9.8 %
NEUTROS ABS: 4872 {cells}/uL (ref 1500–7800)
NEUTROS PCT: 60.9 %
Platelets: 230 10*3/uL (ref 140–400)
RBC: 3.65 10*6/uL — ABNORMAL LOW (ref 3.80–5.10)
RDW: 11.8 % (ref 11.0–15.0)
Total Lymphocyte: 27.2 %
WBC mixed population: 784 cells/uL (ref 200–950)
WBC: 8 10*3/uL (ref 3.8–10.8)

## 2018-02-04 LAB — COMPLETE METABOLIC PANEL WITH GFR
AG RATIO: 1.4 (calc) (ref 1.0–2.5)
ALBUMIN MSPROF: 4.4 g/dL (ref 3.6–5.1)
ALT: 3 U/L — ABNORMAL LOW (ref 6–29)
AST: 15 U/L (ref 10–35)
Alkaline phosphatase (APISO): 46 U/L (ref 33–130)
BILIRUBIN TOTAL: 0.3 mg/dL (ref 0.2–1.2)
BUN: 17 mg/dL (ref 7–25)
CALCIUM: 9.2 mg/dL (ref 8.6–10.4)
CHLORIDE: 96 mmol/L — AB (ref 98–110)
CO2: 37 mmol/L — ABNORMAL HIGH (ref 20–32)
Creat: 0.79 mg/dL (ref 0.60–0.88)
GFR, EST AFRICAN AMERICAN: 79 mL/min/{1.73_m2} (ref >=60)
GFR, EST NON AFRICAN AMERICAN: 68 mL/min/{1.73_m2} (ref >=60)
Globulin: 3.2 g/dL (calc) (ref 1.9–3.7)
Glucose, Bld: 81 mg/dL (ref 65–99)
POTASSIUM: 4.3 mmol/L (ref 3.5–5.3)
Sodium: 137 mmol/L (ref 135–146)
TOTAL PROTEIN: 7.6 g/dL (ref 6.1–8.1)

## 2018-02-04 LAB — LIPID PANEL
CHOL/HDL RATIO: 2.9 (calc) (ref <5.0)
Cholesterol: 200 mg/dL — ABNORMAL HIGH (ref <200)
HDL: 68 mg/dL (ref >50)
LDL Cholesterol (Calc): 118 mg/dL (calc) — ABNORMAL HIGH
NON-HDL CHOLESTEROL (CALC): 132 mg/dL — AB (ref <130)
Triglycerides: 51 mg/dL (ref <150)

## 2018-02-04 LAB — VITAMIN B12: VITAMIN B 12: 987 pg/mL (ref 200–1100)

## 2018-02-04 NOTE — Patient Instructions (Signed)
F/u 4 wks

## 2018-02-04 NOTE — Progress Notes (Signed)
Cephas Darby, MD [address]  [place]  [place], [place] [address]  Main: [phone]  Fax: [phone]    Gastroenterology Consultation  Referring Provider:     [name], MD Primary Care Physician:  [name], MD Primary Gastroenterologist:  Dr. Cephas Darby Reason for Consultation:     dysphagia        HPI:   Melissa Combs is a 84 y.o. female referred by Dr. [name], MD  for consultation & management of dysphagia. Patient has history of severe dementia, is accompanied by her caregiver. Patient lives in a group home. Her caregiver noticed that patient thumps on her chest every time she eats. But she does not choke or regurgitate. She denies any complaints with liquids. The current caregiver has been taking care of her for the past 1 year. She is giving her chopped food. She denies nocturnal awakenings due to chest pain or regurgitation. She did not lose weight. She is not on acid suppression medication. She has regular bowel movements. She is wondering if this is secondary to acid reflux or secondary to her progressive dementia.  Follow up visit 02/04/18 She is on omeprazole 20mg  daily, symptoms remain the same. Lost about 5-7lbs since last visit. X Ray esophagus revealed mild reflux and small developing diverticulum   NSAIDs: aspirin 81 and meloxicam daily  Antiplts/Anticoagulants/Anti thrombotics: aspirin 81  GI Procedures: none  Past Medical History:  Diagnosis Date  . Allergy   . Anemia   . Arthritis   . B12 deficiency 11/06/2015   Less than 400 October 2016  . Chronic constipation   . Dementia   . Dementia   . Family history of adverse reaction to anesthesia    brother, daughter and grandson had malignant hyperthermia in the past  . Glaucoma   . History of fibrocystic disease of breast    biopsy done, negative  . History of hand fracture    left  . History of shingles 2014  . History of wrist fracture    left  . Hypertension     . Impacted cerumen   . Pneumonia   . Postherpetic neuralgia   . TIA (transient ischemic attack) Feb 2016    Past Surgical History:  Procedure Laterality Date  . BREAST LUMPECTOMY     unsure which side  . CATARACT EXTRACTION W/PHACO Right 01/12/2017   Procedure: CATARACT EXTRACTION PHACO AND INTRAOCULAR LENS PLACEMENT (IOC);  Surgeon: Birder Robson, MD;  Location: [facility] ORS;  Service: Ophthalmology;  Laterality: Right;  [place] 1:05.8 AP% 26.0 CDE 17.07 Fluid pack lot # [id number]     Current Outpatient Medications:  .  acetaminophen (TYLENOL 8 HOUR ARTHRITIS PAIN) 650 MG CR tablet, Take 650 mg by mouth 3 (three) times daily as needed. , Disp: , Rfl:  .  amLODipine (NORVASC) 5 MG tablet, Take 1 tablet (5 mg total) by mouth daily., Disp: 90 tablet, Rfl: 3 .  aspirin EC 81 MG tablet, Take 1 tablet (81 mg total) by mouth daily., Disp: 30 tablet, Rfl: 11 .  Bismuth Subsalicylate 756 EP/32RJ SUSP, Take 30 mLs (1,050 mg total) by mouth 2 (two) times daily as needed., Disp: , Rfl:  .  brimonidine-timolol (COMBIGAN) 0.2-0.5 % ophthalmic solution, Place 1 drop into both eyes 2 (two) times daily., Disp: , Rfl:  .  carvedilol (COREG) 6.25 MG tablet, TAKE ONE TABLET BY MOUTH TWO TIMES A DAY, Disp: 60 tablet, Rfl: 11 .  citalopram (CELEXA)  10 MG tablet, Take 1 tablet (10 mg total) by mouth daily., Disp: 30 tablet, Rfl: 11 .  Diapers & Supplies (HUGGIES PULL-UPS) MISC, 1 each by Does not apply route 3 (three) times daily. Adult size medium, urinary and stool incontinence, LON 99 months, Disp: 100 each, Rfl: 5 .  diclofenac sodium (VOLTAREN) 1 % GEL, Apply 2 grams to each knee three times a day while awake, for example, 8 am, 2 pm, and 8 pm, Disp: 100 g, Rfl: 5 .  divalproex (DEPAKOTE SPRINKLES) 125 MG capsule, Take 125 mg by mouth 3 (three) times daily. , Disp: , Rfl:  .  donepezil (ARICEPT) 10 MG tablet, Take 1 tablet (10 mg total) by mouth at bedtime., Disp: 30 tablet, Rfl: 11 .  Elastic Bandages &  Supports (MEDICAL COMPRESSION STOCKINGS) MISC, Put stockings on every morning and remove every night; do not sleep in stockings; get the kind with open toes, Disp: 2 each, Rfl: 0 .  fluticasone (FLONASE) 50 MCG/ACT nasal spray, USE 2 SPRAYS IN BOTH NOSTRILS ONCE DAILYFOR RHINITIS, Disp: 16 g, Rfl: 11 .  latanoprost (XALATAN) 0.005 % ophthalmic solution, Place 1 drop into both eyes at bedtime., Disp: , Rfl:  .  lisinopril (PRINIVIL,ZESTRIL) 20 MG tablet, Take 1 tablet (20 mg total) by mouth daily., Disp: 30 tablet, Rfl: 3 .  meloxicam (MOBIC) 7.5 MG tablet, Take 1 tablet (7.5 mg total) by mouth daily. For 30 days only, Disp: 30 tablet, Rfl: 0 .  montelukast (SINGULAIR) 5 MG chewable tablet, CHEW 1 TABLET BY MOUTH AT BEDTIME, Disp: 30 tablet, Rfl: 11 .  NAMENDA XR 28 MG CP24 24 hr capsule, TAKE 1 CAPSULE BY MOUTH ONCE DAILY FOR MEMORY, Disp: 30 capsule, Rfl: 11 .  omeprazole (PRILOSEC) 20 MG capsule, Take 1 capsule (20 mg total) by mouth daily., Disp: 30 capsule, Rfl: 3 .  polyethylene glycol powder (GLYCOLAX/MIRALAX) powder, MIX 17 GR (1 CAPFUL) IN 8 OZ WATER/JUICEAND DRINK ONCE DAILY AS NEEDED TO FOR CONSTIPATION, Disp: 527 g, Rfl: 11 .  pravastatin (PRAVACHOL) 20 MG tablet, Take 1 tablet (20 mg total) by mouth at bedtime., Disp: 30 tablet, Rfl: 3 .  pregabalin (LYRICA) 25 MG capsule, 25 mg qhs every other night, Disp: 30 capsule, Rfl: 2 .  vitamin B-12 (CYANOCOBALAMIN) 500 MCG tablet, TAKE ONE TABLET BY MOUTH EVERY DAY, Disp: 28 tablet, Rfl: 12   Family History  Family history unknown: Yes     Social History   Tobacco Use  . Smoking status: Never Smoker  . Smokeless tobacco: Never Used  Substance Use Topics  . Alcohol use: No    Alcohol/week: 0.0 oz  . Drug use: No    Allergies as of 02/04/2018  . (No Known Allergies)    Review of Systems:    All systems reviewed and negative except where noted in HPI.   Physical Exam:  BP (!) 146/61   Pulse (!) 58   Ht 5\' 5"  (1.651 m)   Wt  130 lb (59 kg)   BMI 21.63 kg/m  No LMP recorded. Patient is postmenopausal.  General:   Alert,  Moderately built, moderately nourished for her age, pleasant and cooperative in NAD Head:  Normocephalic and atraumatic. Eyes:  Sclera clear, no icterus.   Conjunctiva pink. Ears:  Normal auditory acuity. Nose:  No deformity, discharge, or lesions. Mouth:  No deformity or lesions,oropharynx pink & moist, no evidence of thrush Neck:  Supple; no masses or thyromegaly. Lungs:  Respirations even and unlabored.  Clear throughout to auscultation.   No wheezes, crackles, or rhonchi. No acute distress. Heart:  Regular rate and rhythm; no murmurs, clicks, rubs, or gallops. Abdomen:  Normal bowel sounds. Soft, non-tender and non-distended without masses, hepatosplenomegaly or hernias noted.  No guarding or rebound tenderness.   Rectal: Not performed Msk:  Symmetrical without gross deformities. Good, equal movement & strength bilaterally. Pulses:  Normal pulses noted. Extremities:  No clubbing or edema.  No cyanosis. Neurologic:  Alert and oriented x3;  grossly normal neurologically. Skin:  Intact without significant lesions or rashes. No jaundice. Psych:  Alert and cooperative. Normal mood and affect.  Imaging Studies: DG esophagus 12/2017 IMPRESSION: 1. Prominent cricopharyngeus muscle with a small developing Zenker's diverticulum. 2. Mild gastroesophageal reflux.  Assessment and Plan:   Orlene Salmons is a 84 y.o. black female with history of dementia, with 1 year history of noticeable possible difficulty swallowing with solids and weight loss. Differentials include acid reflux, peptic stricture or erosive esophagitis or ring. X ray esophagus revealed mild reflux  - continue omeprazole 20 mg daily - EGD due to ongoing weight loss and symptoms   Follow up in 4 weeks   Cephas Darby, MD

## 2018-02-09 ENCOUNTER — Encounter: Payer: Self-pay | Admitting: *Deleted

## 2018-02-10 ENCOUNTER — Encounter: Payer: Self-pay | Admitting: Certified Registered"

## 2018-02-10 ENCOUNTER — Ambulatory Visit: Payer: Medicare Other | Admitting: Certified Registered"

## 2018-02-10 ENCOUNTER — Ambulatory Visit
Admission: RE | Admit: 2018-02-10 | Discharge: 2018-02-10 | Disposition: A | Discharge status: Nursing Home (Includes ICF) | Payer: Medicare Other | Source: Ambulatory Visit | Attending: Gastroenterology | Admitting: Gastroenterology

## 2018-02-10 ENCOUNTER — Encounter
Admission: RE | Disposition: A | Discharge status: Nursing Home (Includes ICF) | Payer: Self-pay | Source: Ambulatory Visit | Attending: Gastroenterology

## 2018-02-10 ENCOUNTER — Telehealth: Payer: Self-pay | Admitting: Gastroenterology

## 2018-02-10 DIAGNOSIS — R1310 Dysphagia, unspecified: Secondary | ICD-10-CM | POA: Diagnosis not present

## 2018-02-10 DIAGNOSIS — Z79899 Other long term (current) drug therapy: Secondary | ICD-10-CM | POA: Diagnosis not present

## 2018-02-10 DIAGNOSIS — Z7982 Long term (current) use of aspirin: Secondary | ICD-10-CM | POA: Diagnosis not present

## 2018-02-10 DIAGNOSIS — I10 Essential (primary) hypertension: Secondary | ICD-10-CM | POA: Diagnosis not present

## 2018-02-10 DIAGNOSIS — M1990 Unspecified osteoarthritis, unspecified site: Secondary | ICD-10-CM | POA: Diagnosis not present

## 2018-02-10 DIAGNOSIS — Z8673 Personal history of transient ischemic attack (TIA), and cerebral infarction without residual deficits: Secondary | ICD-10-CM | POA: Insufficient documentation

## 2018-02-10 DIAGNOSIS — I129 Hypertensive chronic kidney disease with stage 1 through stage 4 chronic kidney disease, or unspecified chronic kidney disease: Secondary | ICD-10-CM | POA: Diagnosis not present

## 2018-02-10 DIAGNOSIS — E785 Hyperlipidemia, unspecified: Secondary | ICD-10-CM | POA: Diagnosis not present

## 2018-02-10 DIAGNOSIS — D631 Anemia in chronic kidney disease: Secondary | ICD-10-CM | POA: Diagnosis not present

## 2018-02-10 DIAGNOSIS — E538 Deficiency of other specified B group vitamins: Secondary | ICD-10-CM | POA: Diagnosis not present

## 2018-02-10 DIAGNOSIS — K5909 Other constipation: Secondary | ICD-10-CM | POA: Insufficient documentation

## 2018-02-10 DIAGNOSIS — F0390 Unspecified dementia without behavioral disturbance: Secondary | ICD-10-CM | POA: Insufficient documentation

## 2018-02-10 DIAGNOSIS — H409 Unspecified glaucoma: Secondary | ICD-10-CM | POA: Insufficient documentation

## 2018-02-10 DIAGNOSIS — B0229 Other postherpetic nervous system involvement: Secondary | ICD-10-CM | POA: Diagnosis not present

## 2018-02-10 DIAGNOSIS — N183 Chronic kidney disease, stage 3 (moderate): Secondary | ICD-10-CM | POA: Diagnosis not present

## 2018-02-10 HISTORY — DX: Malignant hyperthermia due to anesthesia, initial encounter: T88.3XXA

## 2018-02-10 HISTORY — PX: ESOPHAGOGASTRODUODENOSCOPY (EGD) WITH PROPOFOL: SHX5813

## 2018-02-10 SURGERY — ESOPHAGOGASTRODUODENOSCOPY (EGD) WITH PROPOFOL
Anesthesia: General

## 2018-02-10 MED ORDER — LIDOCAINE HCL (CARDIAC) PF 100 MG/5ML IV SOSY
PREFILLED_SYRINGE | INTRAVENOUS | Status: DC | PRN
  Administered 2018-02-10: 50 mg via INTRAVENOUS

## 2018-02-10 MED ORDER — GLYCOPYRROLATE 0.2 MG/ML IJ SOLN
INTRAMUSCULAR | Status: DC | PRN
  Administered 2018-02-10: 0.1 mg via INTRAVENOUS

## 2018-02-10 MED ORDER — PHENYLEPHRINE HCL 10 MG/ML IJ SOLN
INTRAMUSCULAR | Status: AC
  Filled 2018-02-10: qty 1

## 2018-02-10 MED ORDER — PROPOFOL 10 MG/ML IV BOLUS
INTRAVENOUS | Status: AC
  Filled 2018-02-10: qty 40

## 2018-02-10 MED ORDER — LIDOCAINE HCL (PF) 2 % IJ SOLN
INTRAMUSCULAR | Status: AC
  Filled 2018-02-10: qty 10

## 2018-02-10 MED ORDER — GLYCOPYRROLATE 0.2 MG/ML IJ SOLN
INTRAMUSCULAR | Status: AC
  Filled 2018-02-10: qty 1

## 2018-02-10 MED ORDER — SODIUM CHLORIDE 0.9 % IV SOLN
INTRAVENOUS | Status: DC | PRN
  Administered 2018-02-10: 10:00:00 via INTRAVENOUS

## 2018-02-10 MED ORDER — PROPOFOL 10 MG/ML IV BOLUS
INTRAVENOUS | Status: DC | PRN
  Administered 2018-02-10: 100 mg via INTRAVENOUS

## 2018-02-10 MED ORDER — SODIUM CHLORIDE 0.9 % IV SOLN
INTRAVENOUS | Status: DC
  Administered 2018-02-10: 1000 mL via INTRAVENOUS

## 2018-02-10 NOTE — H&P (Signed)
Cephas Darby, MD [address]  [place]  [place], [place] [address]  Main: [phone]  Fax: [phone] Pager: [phone]  Primary Care Physician:  [name], MD Primary Gastroenterologist:  Dr. Cephas Darby  Pre-Procedure History & Physical: HPI:  Melissa Combs is a 84 y.o. female is here for an endoscopy.   Past Medical History:  Diagnosis Date  . Allergy   . Anemia   . Arthritis   . B12 deficiency 11/06/2015   Less than 400 October 2016  . Chronic constipation   . Dementia   . Dementia   . Family history of adverse reaction to anesthesia    brother, daughter and grandson had malignant hyperthermia in the past  . Glaucoma   . History of fibrocystic disease of breast    biopsy done, negative  . History of hand fracture    left  . History of shingles 2014  . History of wrist fracture    left  . Hypertension   . Impacted cerumen   . Malignant hyperthermia   . Pneumonia   . Postherpetic neuralgia   . TIA (transient ischemic attack) Feb 2016    Past Surgical History:  Procedure Laterality Date  . BREAST LUMPECTOMY     unsure which side  . CATARACT EXTRACTION W/PHACO Right 01/12/2017   Procedure: CATARACT EXTRACTION PHACO AND INTRAOCULAR LENS PLACEMENT (IOC);  Surgeon: Birder Robson, MD;  Location: [facility] ORS;  Service: Ophthalmology;  Laterality: Right;  [place] 1:05.8 AP% 26.0 CDE 17.07 Fluid pack lot # [id number]    Prior to Admission medications   Medication Sig Start Date End Date Taking? Authorizing Provider  acetaminophen (TYLENOL 8 HOUR ARTHRITIS PAIN) 650 MG CR tablet Take 650 mg by mouth 3 (three) times daily as needed.     [provider]  amLODipine (NORVASC) 5 MG tablet Take 1 tablet (5 mg total) by mouth daily. 09/02/17   [name], MD  aspirin EC 81 MG tablet Take 1 tablet (81 mg total) by mouth daily. 08/01/17   [name], MD  Bismuth Subsalicylate 062 BJ/62GB SUSP Take 30 mLs (1,050 mg total) by mouth 2 (two)  times daily as needed. 09/02/17   Lada, Satira Anis, MD  brimonidine-timolol (COMBIGAN) 0.2-0.5 % ophthalmic solution Place 1 drop into both eyes 2 (two) times daily.    [provider]  carvedilol (COREG) 6.25 MG tablet TAKE ONE TABLET BY MOUTH TWO TIMES A DAY 04/06/17   Lada, Satira Anis, MD  citalopram (CELEXA) 10 MG tablet Take 1 tablet (10 mg total) by mouth daily. 09/28/16   [name], MD  Diapers & Supplies (HUGGIES PULL-UPS) MISC 1 each by Does not apply route 3 (three) times daily. Adult size medium, urinary and stool incontinence, LON 99 months 09/22/16   [name], MD  diclofenac sodium (VOLTAREN) 1 % GEL Apply 2 grams to each knee three times a day while awake, for example, 8 am, 2 pm, and 8 pm 09/22/16   Lada, Satira Anis, MD  divalproex (DEPAKOTE SPRINKLES) 125 MG capsule Take 125 mg by mouth 3 (three) times daily.  12/31/16   Lada, Satira Anis, MD  donepezil (ARICEPT) 10 MG tablet Take 1 tablet (10 mg total) by mouth at bedtime. 10/02/16   [name], MD  Elastic Bandages & Supports (MEDICAL COMPRESSION STOCKINGS) MISC Put stockings on every morning and remove every night; do not sleep in stockings; get the kind with open toes 02/03/18  [name], MD  fluticasone (FLONASE) 50 MCG/ACT nasal spray USE 2 SPRAYS IN BOTH NOSTRILS ONCE DAILYFOR RHINITIS 04/24/16   Lada, Satira Anis, MD  latanoprost (XALATAN) 0.005 % ophthalmic solution Place 1 drop into both eyes at bedtime.    [provider]  lisinopril (PRINIVIL,ZESTRIL) 20 MG tablet Take 1 tablet (20 mg total) by mouth daily. 11/04/17   Lada, Satira Anis, MD  meloxicam (MOBIC) 7.5 MG tablet Take 1 tablet (7.5 mg total) by mouth daily. For 30 days only 11/04/17   Gillis Santa, MD  montelukast (SINGULAIR) 5 MG chewable tablet CHEW 1 TABLET BY MOUTH AT BEDTIME 04/06/17   Lada, Satira Anis, MD  NAMENDA XR 28 MG CP24 24 hr capsule TAKE 1 CAPSULE BY MOUTH ONCE DAILY FOR MEMORY 05/13/16   Lada, Satira Anis, MD  omeprazole (PRILOSEC) 20 MG  capsule Take 1 capsule (20 mg total) by mouth daily. 12/21/17   Vanga, Tally Due, MD  polyethylene glycol powder (GLYCOLAX/MIRALAX) powder MIX 17 GR (1 CAPFUL) IN 8 OZ WATER/JUICEAND DRINK ONCE DAILY AS NEEDED TO FOR CONSTIPATION 04/14/16   Lada, Satira Anis, MD  pravastatin (PRAVACHOL) 20 MG tablet Take 1 tablet (20 mg total) by mouth at bedtime. 11/04/17   [name], MD  pregabalin (LYRICA) 25 MG capsule 25 mg qhs every other night 11/04/17   Gillis Santa, MD  vitamin B-12 (CYANOCOBALAMIN) 500 MCG tablet TAKE ONE TABLET BY MOUTH EVERY DAY 08/26/17   [name], MD    Allergies as of 02/04/2018  . (No Known Allergies)    Family History  Family history unknown: Yes    Social History   Socioeconomic History  . Marital status: Single    Spouse name: Not on file  . Number of children: Not on file  . Years of education: Not on file  . Highest education level: Not on file  Occupational History  . Not on file  Social Needs  . Financial resource strain: Not on file  . Food insecurity:    Worry: Not on file    Inability: Not on file  . Transportation needs:    Medical: Not on file    Non-medical: Not on file  Tobacco Use  . Smoking status: Never Smoker  . Smokeless tobacco: Never Used  Substance and Sexual Activity  . Alcohol use: No    Alcohol/week: 0.0 oz  . Drug use: No  . Sexual activity: Never  Lifestyle  . Physical activity:    Days per week: Not on file    Minutes per session: Not on file  . Stress: Not on file  Relationships  . Social connections:    Talks on phone: Not on file    Gets together: Not on file    Attends religious service: Not on file    Active member of club or organization: Not on file    Attends meetings of clubs or organizations: Not on file    Relationship status: Not on file  . Intimate partner violence:    Fear of current or ex partner: Not on file    Emotionally abused: Not on file    Physically abused: Not on file    Forced  sexual activity: Not on file  Other Topics Concern  . Not on file  Social History Narrative  . Not on file    Review of Systems: See HPI, otherwise negative ROS  Physical Exam: BP (!) 148/55   Pulse (!) 51   Temp (!)  96.3 F (35.7 C) (Tympanic)   Resp 18   Ht 5\' 5"  (1.651 m)   Wt 130 lb (59 kg)   BMI 21.63 kg/m  General:   Alert,  pleasant and cooperative in NAD Head:  Normocephalic and atraumatic. Neck:  Supple; no masses or thyromegaly. Lungs:  Clear throughout to auscultation.    Heart:  Regular rate and rhythm. Abdomen:  Soft, nontender and nondistended. Normal bowel sounds, without guarding, and without rebound.   Neurologic:  Alert and  oriented x4;  grossly normal neurologically.  Impression/Plan: Melissa Combs is here for an endoscopy to be performed for dysphagia  Risks, benefits, limitations, and alternatives regarding  endoscopy have been reviewed with the patient.  Questions have been answered.  All parties agreeable.   Sherri Sear, MD  02/10/2018, 10:29 AM

## 2018-02-10 NOTE — Anesthesia Preprocedure Evaluation (Signed)
Anesthesia Evaluation  Patient identified by MRN, date of birth, ID band Patient awake    Reviewed: Allergy & Precautions, H&P , NPO status , Patient's Chart, lab work & pertinent test results, reviewed documented beta blocker date and time   History of Anesthesia Complications (+) MALIGNANT HYPERTHERMIA, Family history of anesthesia reaction and history of anesthetic complications  Airway Mallampati: II   Neck ROM: full    Dental  (+) Poor Dentition   Pulmonary neg pulmonary ROS, pneumonia,    Pulmonary exam normal        Cardiovascular Exercise Tolerance: Poor hypertension, On Medications negative cardio ROS Normal cardiovascular exam Rhythm:regular Rate:Normal     Neuro/Psych PSYCHIATRIC DISORDERS Dementia TIAnegative neurological ROS  negative psych ROS   GI/Hepatic negative GI ROS, Neg liver ROS,   Endo/Other  negative endocrine ROS  Renal/GU Renal diseasenegative Renal ROS  negative genitourinary   Musculoskeletal   Abdominal   Peds  Hematology negative hematology ROS (+) anemia ,   Anesthesia Other Findings Past Medical History: No date: Allergy No date: Anemia No date: Arthritis 11/06/2015: B12 deficiency     Comment:  Less than 400 October 2016 No date: Chronic constipation No date: Dementia No date: Dementia No date: Family history of adverse reaction to anesthesia     Comment:  brother, daughter and grandson had malignant               hyperthermia in the past No date: Glaucoma No date: History of fibrocystic disease of breast     Comment:  biopsy done, negative No date: History of hand fracture     Comment:  left 2014: History of shingles No date: History of wrist fracture     Comment:  left No date: Hypertension No date: Impacted cerumen No date: Malignant hyperthermia No date: Pneumonia No date: Postherpetic neuralgia Feb 2016: TIA (transient ischemic attack) Past Surgical  History: No date: BREAST LUMPECTOMY     Comment:  unsure which side 01/12/2017: CATARACT EXTRACTION W/PHACO; Right     Comment:  Procedure: CATARACT EXTRACTION PHACO AND INTRAOCULAR               LENS PLACEMENT ([place]);  Surgeon: Birder Robson, MD;                Location: [facility] ORS;  Service: Ophthalmology;  Laterality:              Right;  [place] 1:05.8 AP% 26.0 CDE 17.07 Fluid pack lot #               [id number]   Reproductive/Obstetrics negative OB ROS                             Anesthesia Physical Anesthesia Plan  ASA: III  Anesthesia Plan: General   Post-op Pain Management:    Induction:   PONV Risk Score and Plan:   Airway Management Planned:   Additional Equipment:   Intra-op Plan:   Post-operative Plan:   Informed Consent: I have reviewed the patients History and Physical, chart, labs and discussed the procedure including the risks, benefits and alternatives for the proposed anesthesia with the patient or authorized representative who has indicated his/her understanding and acceptance.   Dental Advisory Given  Plan Discussed with: CRNA  Anesthesia Plan Comments:         Anesthesia Quick Evaluation

## 2018-02-10 NOTE — Transfer of Care (Signed)
Immediate Anesthesia Transfer of Care Note  Patient: [name]  Procedure(s) Performed: ESOPHAGOGASTRODUODENOSCOPY (EGD) WITH PROPOFOL (N/A )  Patient Location: PACU and Endoscopy Unit  Anesthesia Type:General  Level of Consciousness: drowsy and patient cooperative  Airway & Oxygen Therapy: Patient Spontanous Breathing  Post-op Assessment: Report given to RN, Post -op Vital signs reviewed and stable and Patient moving all extremities  Post vital signs: Reviewed and stable  Last Vitals:  Vitals Value Taken Time  BP 118/59 02/10/2018 10:48 AM  Temp 36.1 C 02/10/2018 10:48 AM  Pulse 57 02/10/2018 10:49 AM  Resp 13 02/10/2018 10:49 AM  SpO2 100 % 02/10/2018 10:49 AM  Vitals shown include unvalidated device data.  Last Pain:  Vitals:   02/10/18 1048  TempSrc: Tympanic  PainSc: Asleep         Complications: No apparent anesthesia complications

## 2018-02-10 NOTE — Telephone Encounter (Signed)
Returned caregivers call  She thought since patient had taken b12 and vitamin d yesterday that she could not have her EGD.  I informed her that as long as she has not eatten anything she could still have her EGD.  She stated that she would bring her on over for the EGD.  Notified Endo that she was arriving within ten minutes.

## 2018-02-10 NOTE — Telephone Encounter (Signed)
pt needs to reschedule procedure for 02/10/18

## 2018-02-10 NOTE — Op Note (Signed)
[facility] Patient Name: Melissa Combs Procedure Date: 02/10/2018 10:27 AM MRN: [id number] Account #: [id number] Date of Birth: 10/11/1929 Admit Type: Outpatient Age: 84 Room: [facility] ENDO ROOM 3 Gender: Female Note Status: Finalized Procedure:            Upper GI endoscopy Indications:          Dysphagia Providers:            Lin Landsman MD, MD Referring MD:         [name] (Referring MD) Medicines:            Monitored Anesthesia Care Complications:        No immediate complications. Estimated blood loss: None. Procedure:            Pre-Anesthesia Assessment:                       - Prior to the procedure, a History and Physical was                        performed, and patient medications and allergies were                        reviewed. The patient is competent. The risks and                        benefits of the procedure and the sedation options and                        risks were discussed with the patient. All questions                        were answered and informed consent was obtained.                        Patient identification and proposed procedure were                        verified by the physician, the nurse, the                        anesthesiologist, the anesthetist and the technician in                        the pre-procedure area in the procedure room in the                        endoscopy suite. Mental Status Examination: alert and                        oriented. Airway Examination: normal oropharyngeal                        airway and neck mobility. Respiratory Examination:                        clear to auscultation. CV Examination: normal.                        Prophylactic Antibiotics: The patient does not require  prophylactic antibiotics. Prior Anticoagulants: The                        patient has taken no previous anticoagulant or   antiplatelet agents. ASA Grade Assessment: III - A                        patient with severe systemic disease. After reviewing                        the risks and benefits, the patient was deemed in                        satisfactory condition to undergo the procedure. The                        anesthesia plan was to use monitored anesthesia care                        (MAC). Immediately prior to administration of                        medications, the patient was re-assessed for adequacy                        to receive sedatives. The heart rate, respiratory rate,                        oxygen saturations, blood pressure, adequacy of                        pulmonary ventilation, and response to care were                        monitored throughout the procedure. The physical status                        of the patient was re-assessed after the procedure.                       After obtaining informed consent, the endoscope was                        passed under direct vision. Throughout the procedure,                        the patient's blood pressure, pulse, and oxygen                        saturations were monitored continuously. The Endoscope                        was introduced through the mouth, and advanced to the                        second part of duodenum. The upper GI endoscopy was                        accomplished without difficulty. The patient tolerated  the procedure well. Findings:      The duodenal bulb and second portion of the duodenum were normal.      The entire examined stomach was normal.      The cardia and gastric fundus were normal on retroflexion.      The gastroesophageal junction and examined esophagus were normal.      There was mild resistance at the upper esophageal sphincter while       passing scope secondary to extrinsic compression from stiff neck and       possible cervical osteophytes Impression:           - Normal  duodenal bulb and second portion of the                        duodenum.                       - Normal stomach.                       - Normal gastroesophageal junction and esophagus.                       - No specimens collected. Recommendation:       - Discharge patient to a nursing home (with escort).                       - Pureed diet and soft diet.                       - Continue present medications.                       - Flexing the neck forward while eating will probably                        help Procedure Code(s):    --- Professional ---                       [phone], Esophagogastroduodenoscopy, flexible, transoral;                        diagnostic, including collection of specimen(s) by                        brushing or washing, when performed (separate procedure) Diagnosis Code(s):    --- Professional ---                       R13.10, Dysphagia, unspecified CPT copyright 2017 American Medical Association. All rights reserved. The codes documented in this report are preliminary and upon coder review may  be revised to meet current compliance requirements. Dr. Ulyess Mort Lin Landsman MD, MD 02/10/2018 10:49:15 AM This report has been signed electronically. Number of Addenda: 0 Note Initiated On: 02/10/2018 10:27 AM      [facility]

## 2018-02-10 NOTE — Anesthesia Post-op Follow-up Note (Signed)
Anesthesia QCDR form completed.        

## 2018-02-10 NOTE — Anesthesia Postprocedure Evaluation (Signed)
Anesthesia Post Note  Patient: [employer]  Procedure(s) Performed: ESOPHAGOGASTRODUODENOSCOPY (EGD) WITH PROPOFOL (N/A )  Patient location during evaluation: Endoscopy Anesthesia Type: General Level of consciousness: awake and alert, oriented and patient cooperative Pain management: satisfactory to patient Vital Signs Assessment: post-procedure vital signs reviewed and stable Respiratory status: spontaneous breathing and respiratory function stable Cardiovascular status: blood pressure returned to baseline and stable Postop Assessment: no headache, no backache, patient able to bend at knees, no apparent nausea or vomiting, adequate PO intake and able to ambulate Anesthetic complications: no     Last Vitals:  Vitals:   02/10/18 0957 02/10/18 1048  BP: (!) 148/55 (!) 118/59  Pulse: (!) 51 (!) 58  Resp: 18 14  Temp: (!) 35.7 C (!) 36.1 C  SpO2:  100%    Last Pain:  Vitals:   02/10/18 1048  TempSrc: Tympanic  PainSc: Asleep                 Montie Gelardi H Alanda Colton

## 2018-02-16 ENCOUNTER — Encounter: Payer: Self-pay | Admitting: Gastroenterology

## 2018-02-17 NOTE — Anesthesia Postprocedure Evaluation (Signed)
Anesthesia Post Note  Patient: [facility]  Procedure(s) Performed: ESOPHAGOGASTRODUODENOSCOPY (EGD) WITH PROPOFOL (N/A )  Anesthesia Type: General     Last Vitals:  Vitals:   02/10/18 1108 02/10/18 1118  BP: 133/81 138/72  Pulse: 60 77  Resp: 17 19  Temp:    SpO2: 100% 98%    Last Pain:  Vitals:   02/11/18 0740  TempSrc:   PainSc: 0-No pain                 Molli Barrows

## 2018-03-08 ENCOUNTER — Telehealth: Payer: Self-pay | Admitting: Family Medicine

## 2018-03-08 DIAGNOSIS — R8290 Unspecified abnormal findings in urine: Secondary | ICD-10-CM | POA: Diagnosis not present

## 2018-03-08 NOTE — Telephone Encounter (Signed)
Order faxed.

## 2018-03-08 NOTE — Telephone Encounter (Signed)
Yes, please get urine today for UA, dip, micro with reflex culture Dx: abnormal urine odor

## 2018-03-08 NOTE — Telephone Encounter (Signed)
Copied from [place] [id number]. Topic: General - Other >> Mar 08, 2018 12:15 PM Lennox Solders wrote: Reason for CRM: [facility] patient urine has a smell and she would like an order to do urine specimen to check for infection. Fax number [phone]

## 2018-03-09 ENCOUNTER — Ambulatory Visit (INDEPENDENT_AMBULATORY_CARE_PROVIDER_SITE_OTHER): Payer: Medicare Other | Admitting: Nurse Practitioner

## 2018-03-09 ENCOUNTER — Other Ambulatory Visit: Payer: Self-pay | Admitting: Family Medicine

## 2018-03-09 ENCOUNTER — Encounter: Payer: Self-pay | Admitting: Nurse Practitioner

## 2018-03-09 VITALS — BP 130/60 | HR 67 | Temp 97.4°F | Resp 16 | Ht 65.0 in | Wt 127.6 lb

## 2018-03-09 DIAGNOSIS — N6322 Unspecified lump in the left breast, upper inner quadrant: Secondary | ICD-10-CM | POA: Diagnosis not present

## 2018-03-09 DIAGNOSIS — N6320 Unspecified lump in the left breast, unspecified quadrant: Secondary | ICD-10-CM | POA: Diagnosis not present

## 2018-03-09 MED ORDER — DOXYCYCLINE HYCLATE 100 MG PO TABS: 100.0000 mg | ORAL_TABLET | Freq: Two times a day (BID) | ORAL | 0 refills | Status: DC

## 2018-03-09 NOTE — Progress Notes (Signed)
Start doxy Culture pending

## 2018-03-09 NOTE — Progress Notes (Addendum)
Name: Melissa Combs   MRN: [id number]    DOB: [birth year]   Date:03/09/2018       Progress Note  Subjective  Chief Complaint  Chief Complaint  Patient presents with  . Breast Mass    left breast mass the size of a ball. Daughter reports that she has had a cyst removed several years ago. Caregiver denies that the patient has been in pain.    HPI  Patient from [facility] has advanced dementia, verbal- but with nonsensical speech, here with caregiver jocelyn- noted left breast mass noted this morning when showering her. States called daughter states she has a left breast cyst that was removed 40 years ago or so.   Daughter- Blythe Stanford is POA [phone].     Patient Active Problem List   Diagnosis Date Noted  . Dysphagia 11/26/2017  . PAC (premature atrial contraction) 09/02/2017  . Chronic pain syndrome 07/08/2017  . Urinary incontinence 10/03/2016  . Allergic rhinitis 11/29/2015  . B12 deficiency 11/06/2015  . Osteoporosis screening 11/01/2015  . Hypochloremia 09/29/2015  . Hyponatremia 09/29/2015  . Medication monitoring encounter 09/27/2015  . Bilateral leg edema 09/27/2015  . Dyslipidemia 06/27/2015  . CKD (chronic kidney disease) stage 3, GFR 30-59 ml/min (HCC) 03/28/2015  . Glaucoma   . Hypertension   . Dementia   . Anemia   . Arthritis   . Chronic constipation   . Postherpetic neuralgia   . Post herpetic neuralgia head 01/13/2015  . Bilateral occipital neuralgia 01/13/2015  . DDD (degenerative disc disease), lumbar 01/13/2015  . DJD (degenerative joint disease) of knee 01/13/2015  . TIA (transient ischemic attack) 10/01/2014    Past Medical History:  Diagnosis Date  . Allergy   . Anemia   . Arthritis   . B12 deficiency 11/06/2015   Less than 400 October 2016  . Chronic constipation   . Dementia   . Dementia   . Family history of adverse reaction to anesthesia    brother, daughter and grandson had malignant hyperthermia in the past   . Glaucoma   . History of fibrocystic disease of breast    biopsy done, negative  . History of hand fracture    left  . History of shingles 2014  . History of wrist fracture    left  . Hypertension   . Impacted cerumen   . Malignant hyperthermia   . Pneumonia   . Postherpetic neuralgia   . TIA (transient ischemic attack) Feb 2016    Past Surgical History:  Procedure Laterality Date  . BREAST LUMPECTOMY     unsure which side  . CATARACT EXTRACTION W/PHACO Right 01/12/2017   Procedure: CATARACT EXTRACTION PHACO AND INTRAOCULAR LENS PLACEMENT ([facility]);  Surgeon: Birder Robson, MD;  Location: [facility] ORS;  Service: Ophthalmology;  Laterality: Right;  [place] 1:05.8 AP% 26.0 CDE 17.07 Fluid pack lot # [id number]  . ESOPHAGOGASTRODUODENOSCOPY (EGD) WITH PROPOFOL N/A 02/10/2018   Procedure: ESOPHAGOGASTRODUODENOSCOPY (EGD) WITH PROPOFOL;  Surgeon: Lin Landsman, MD;  Location: [facility];  Service: Gastroenterology;  Laterality: N/A;    Social History   Tobacco Use  . Smoking status: Never Smoker  . Smokeless tobacco: Never Used  Substance Use Topics  . Alcohol use: No    Alcohol/week: 0.0 oz     Current Outpatient Medications:  .  acetaminophen (TYLENOL 8 HOUR ARTHRITIS PAIN) 650 MG CR tablet, Take 650 mg by mouth 3 (three) times daily as needed. , Disp: , Rfl:  .  amLODipine ([place])  5 MG tablet, Take 1 tablet (5 mg total) by mouth daily., Disp: 90 tablet, Rfl: 3 .  aspirin EC 81 MG tablet, Take 1 tablet (81 mg total) by mouth daily., Disp: 30 tablet, Rfl: 11 .  Bismuth Subsalicylate 540 GQ/67YP SUSP, Take 30 mLs (1,050 mg total) by mouth 2 (two) times daily as needed., Disp: , Rfl:  .  brimonidine-timolol (COMBIGAN) 0.2-0.5 % ophthalmic solution, Place 1 drop into both eyes 2 (two) times daily., Disp: , Rfl:  .  carvedilol (COREG) 6.25 MG tablet, TAKE ONE TABLET BY MOUTH TWO TIMES A DAY, Disp: 60 tablet, Rfl: 11 .  citalopram (CELEXA) 10 MG tablet, Take 1 tablet (10 mg  total) by mouth daily., Disp: 30 tablet, Rfl: 11 .  Diapers & Supplies (HUGGIES PULL-UPS) MISC, 1 each by Does not apply route 3 (three) times daily. Adult size medium, urinary and stool incontinence, LON 99 months, Disp: 100 each, Rfl: 5 .  diclofenac sodium (VOLTAREN) 1 % GEL, Apply 2 grams to each knee three times a day while awake, for example, 8 am, 2 pm, and 8 pm, Disp: 100 g, Rfl: 5 .  divalproex (DEPAKOTE SPRINKLES) 125 MG capsule, Take 125 mg by mouth 3 (three) times daily. , Disp: , Rfl:  .  donepezil (ARICEPT) 10 MG tablet, Take 1 tablet (10 mg total) by mouth at bedtime., Disp: 30 tablet, Rfl: 11 .  Elastic Bandages & Supports (MEDICAL COMPRESSION STOCKINGS) MISC, Put stockings on every morning and remove every night; do not sleep in stockings; get the kind with open toes, Disp: 2 each, Rfl: 0 .  fluticasone (FLONASE) 50 MCG/ACT nasal spray, USE 2 SPRAYS IN BOTH NOSTRILS ONCE DAILYFOR RHINITIS, Disp: 16 g, Rfl: 11 .  latanoprost (XALATAN) 0.005 % ophthalmic solution, Place 1 drop into both eyes at bedtime., Disp: , Rfl:  .  lisinopril (PRINIVIL,ZESTRIL) 20 MG tablet, Take 1 tablet (20 mg total) by mouth daily., Disp: 30 tablet, Rfl: 3 .  montelukast (SINGULAIR) 5 MG chewable tablet, CHEW 1 TABLET BY MOUTH AT BEDTIME, Disp: 30 tablet, Rfl: 11 .  NAMENDA XR 28 MG CP24 24 hr capsule, TAKE 1 CAPSULE BY MOUTH ONCE DAILY FOR MEMORY, Disp: 30 capsule, Rfl: 11 .  omeprazole (PRILOSEC) 20 MG capsule, Take 1 capsule (20 mg total) by mouth daily., Disp: 30 capsule, Rfl: 3 .  polyethylene glycol powder (GLYCOLAX/MIRALAX) powder, MIX 17 GR (1 CAPFUL) IN 8 OZ WATER/JUICEAND DRINK ONCE DAILY AS NEEDED TO FOR CONSTIPATION, Disp: 527 g, Rfl: 11 .  pravastatin (PRAVACHOL) 20 MG tablet, Take 1 tablet (20 mg total) by mouth at bedtime., Disp: 30 tablet, Rfl: 3 .  pregabalin (LYRICA) 25 MG capsule, 25 mg qhs every other night, Disp: 30 capsule, Rfl: 2 .  vitamin B-12 (CYANOCOBALAMIN) 500 MCG tablet, TAKE ONE  TABLET BY MOUTH EVERY DAY, Disp: 28 tablet, Rfl: 12 .  doxycycline (VIBRA-TABS) 100 MG tablet, Take 1 tablet (100 mg total) by mouth 2 (two) times daily. (Patient not taking: Reported on 03/09/2018), Disp: 14 tablet, Rfl: 0 .  meloxicam (MOBIC) 7.5 MG tablet, Take 1 tablet (7.5 mg total) by mouth daily. For 30 days only (Patient not taking: Reported on 03/09/2018), Disp: 30 tablet, Rfl: 0  No Known Allergies  ROS   No other specific complaints in a complete review of systems (except as listed in HPI above).  Objective  Vitals:   03/09/18 1001  BP: 130/60  Pulse: 67  Resp: 16  Temp: (!) 97.4 F (  36.3 C)  TempSrc: Oral  SpO2: 96%  Weight: 127 lb 9.6 oz (57.9 kg)  Height: 5\' 5"  (1.651 m)    Body mass index is 21.23 kg/m.  Nursing Note and Vital Signs reviewed.  Physical Exam   Constitutional: Patient appears comfortable, sitting in wheelchair, smiling  Cardiovascular: Normal rate, regular rhythm, S1/S2 present.  No murmur or rub heard.  Pulmonary/Chest: Effort normal and breath sounds clear.  Breast: right breast- no masses or nipple retraction noted, Left breast- large nodule, hard, painless mass palpated with noted skin retraction above nipple Psychiatric: Patient has a normal mood and affect. behavior is normal. Judgment and thought content normal.  Results for orders placed or performed in visit on 03/08/18 (from the past 72 hour(s))  Urinalysis w microscopic + reflex cultur     Status: Abnormal   Collection Time: 03/08/18  4:45 PM  Result Value Ref Range   Color, Urine YELLOW YELLOW   APPearance CLEAR CLEAR   Specific Gravity, Urine 1.009 1.001 - 1.03   pH 7.0 5.0 - 8.0   Glucose, UA NEGATIVE NEGATIVE   Bilirubin Urine NEGATIVE NEGATIVE   Ketones, ur NEGATIVE NEGATIVE   Hgb urine dipstick NEGATIVE NEGATIVE   Protein, ur NEGATIVE NEGATIVE   Nitrites, Initial NEGATIVE NEGATIVE   Leukocyte Esterase 1+ (A) NEGATIVE   WBC, UA 0-5 0 - 5 /HPF   RBC / HPF NONE SEEN 0 -  2 /HPF   Squamous Epithelial / LPF NONE SEEN < OR = 5 /HPF   Bacteria, UA NONE SEEN NONE SEEN /HPF   Hyaline Cast NONE SEEN NONE SEEN /LPF  REFLEXIVE URINE CULTURE     Status: None   Collection Time: 03/08/18  4:45 PM  Result Value Ref Range   REFLEXIVE URINE CULTURE CULTURE INDICATED - RESULTS TO FOLLOW     Assessment & Plan  1. Left breast mass - discussed plan of care with POA who is agreable.  - US BREAST LTD UNI LEFT INC AXILLA; Future - US BREAST LTD UNI RIGHT INC AXILLA; Future - MM DIAG BREAST TOMO BILATERAL; Future   Follow up and care instructions discussed and provided in AVS.  -------------------------------------------- I saw the patient and palpated mass as well; firm, irregular, with retraction as described; no axillary LAD; right breast exam benign; concerning for malignancy; further disposition pending imaging results I have reviewed this encounter including the documentation in this note and/or discussed this patient with the provider, Suezanne Cheshire DNP AGNP-C. I am certifying that I agree with the content of this note as supervising physician. Enid Derry, [facility] 03/09/2018, 5:42 PM

## 2018-03-10 LAB — URINALYSIS W MICROSCOPIC + REFLEX CULTURE
BACTERIA UA: NONE SEEN /HPF
Bilirubin Urine: NEGATIVE
GLUCOSE, UA: NEGATIVE
HGB URINE DIPSTICK: NEGATIVE
HYALINE CAST: NONE SEEN /LPF
Ketones, ur: NEGATIVE
Nitrites, Initial: NEGATIVE
PH: 7 (ref 5.0–8.0)
PROTEIN: NEGATIVE
RBC / HPF: NONE SEEN /HPF (ref 0–2)
Specific Gravity, Urine: 1.009 (ref 1.001–1.03)
Squamous Epithelial / LPF: NONE SEEN /HPF (ref <=5)

## 2018-03-10 LAB — URINE CULTURE
MICRO NUMBER:: 90816683
SPECIMEN QUALITY:: ADEQUATE

## 2018-03-10 LAB — CULTURE INDICATED

## 2018-03-11 ENCOUNTER — Ambulatory Visit
Admission: RE | Admit: 2018-03-11 | Discharge: 2018-03-11 | Disposition: A | Discharge status: Home / Self Care | Payer: Medicare Other | Source: Ambulatory Visit | Attending: Nurse Practitioner | Admitting: Nurse Practitioner

## 2018-03-11 DIAGNOSIS — N6320 Unspecified lump in the left breast, unspecified quadrant: Secondary | ICD-10-CM | POA: Insufficient documentation

## 2018-03-11 DIAGNOSIS — R928 Other abnormal and inconclusive findings on diagnostic imaging of breast: Secondary | ICD-10-CM | POA: Diagnosis not present

## 2018-03-12 ENCOUNTER — Emergency Department: Payer: Medicare Other

## 2018-03-12 ENCOUNTER — Emergency Department
Admission: EM | Admit: 2018-03-12 | Discharge: 2018-03-12 | Disposition: A | Discharge status: Home / Self Care | Payer: Medicare Other | Attending: Emergency Medicine | Admitting: Emergency Medicine

## 2018-03-12 DIAGNOSIS — Z8673 Personal history of transient ischemic attack (TIA), and cerebral infarction without residual deficits: Secondary | ICD-10-CM | POA: Insufficient documentation

## 2018-03-12 DIAGNOSIS — M25551 Pain in right hip: Secondary | ICD-10-CM

## 2018-03-12 DIAGNOSIS — I129 Hypertensive chronic kidney disease with stage 1 through stage 4 chronic kidney disease, or unspecified chronic kidney disease: Secondary | ICD-10-CM | POA: Diagnosis not present

## 2018-03-12 DIAGNOSIS — Z79899 Other long term (current) drug therapy: Secondary | ICD-10-CM | POA: Diagnosis not present

## 2018-03-12 DIAGNOSIS — Z7902 Long term (current) use of antithrombotics/antiplatelets: Secondary | ICD-10-CM | POA: Insufficient documentation

## 2018-03-12 DIAGNOSIS — F0390 Unspecified dementia without behavioral disturbance: Secondary | ICD-10-CM | POA: Insufficient documentation

## 2018-03-12 DIAGNOSIS — Z7982 Long term (current) use of aspirin: Secondary | ICD-10-CM | POA: Diagnosis not present

## 2018-03-12 DIAGNOSIS — R531 Weakness: Secondary | ICD-10-CM | POA: Diagnosis not present

## 2018-03-12 DIAGNOSIS — M79604 Pain in right leg: Secondary | ICD-10-CM | POA: Diagnosis present

## 2018-03-12 DIAGNOSIS — R262 Difficulty in walking, not elsewhere classified: Secondary | ICD-10-CM | POA: Diagnosis not present

## 2018-03-12 DIAGNOSIS — N183 Chronic kidney disease, stage 3 (moderate): Secondary | ICD-10-CM | POA: Diagnosis not present

## 2018-03-12 LAB — CBC WITH DIFFERENTIAL/PLATELET
BASOS PCT: 1 %
Basophils Absolute: 0.1 10*3/uL (ref 0–0.1)
Eosinophils Absolute: 0.1 10*3/uL (ref 0–0.7)
Eosinophils Relative: 1 %
HCT: 32.8 % — ABNORMAL LOW (ref 35.0–47.0)
HEMOGLOBIN: 10.9 g/dL — AB (ref 12.0–16.0)
Lymphocytes Relative: 27 %
Lymphs Abs: 2.7 10*3/uL (ref 1.0–3.6)
MCH: 29.3 pg (ref 26.0–34.0)
MCHC: 33.3 g/dL (ref 32.0–36.0)
MCV: 87.9 fL (ref 80.0–100.0)
MONO ABS: 1.2 10*3/uL — AB (ref 0.2–0.9)
Monocytes Relative: 12 %
NEUTROS ABS: 6 10*3/uL (ref 1.4–6.5)
Neutrophils Relative %: 59 %
Platelets: 226 10*3/uL (ref 150–440)
RBC: 3.73 MIL/uL — ABNORMAL LOW (ref 3.80–5.20)
RDW: 12.9 % (ref 11.5–14.5)
WBC: 10.2 10*3/uL (ref 3.6–11.0)

## 2018-03-12 LAB — COMPREHENSIVE METABOLIC PANEL
ALBUMIN: 3.6 g/dL (ref 3.5–5.0)
ALK PHOS: 42 U/L (ref 38–126)
ALT: 7 U/L (ref 0–44)
ANION GAP: 8 (ref 5–15)
AST: 17 U/L (ref 15–41)
BILIRUBIN TOTAL: 0.5 mg/dL (ref 0.3–1.2)
BUN: 17 mg/dL (ref 8–23)
CALCIUM: 8.9 mg/dL (ref 8.9–10.3)
CO2: 34 mmol/L — ABNORMAL HIGH (ref 22–32)
CREATININE: 0.6 mg/dL (ref 0.44–1.00)
Chloride: 98 mmol/L (ref 98–111)
GFR calc Af Amer: >60 mL/min (ref >60)
GFR calc non Af Amer: >60 mL/min (ref >60)
Glucose, Bld: 92 mg/dL (ref 70–99)
Potassium: 3.8 mmol/L (ref 3.5–5.1)
Sodium: 140 mmol/L (ref 135–145)
TOTAL PROTEIN: 7.3 g/dL (ref 6.5–8.1)

## 2018-03-12 LAB — URINALYSIS, COMPLETE (UACMP) WITH MICROSCOPIC
BILIRUBIN URINE: NEGATIVE
Bacteria, UA: NONE SEEN
GLUCOSE, UA: NEGATIVE mg/dL
Hgb urine dipstick: NEGATIVE
KETONES UR: NEGATIVE mg/dL
Leukocytes, UA: NEGATIVE
NITRITE: NEGATIVE
PH: 8 (ref 5.0–8.0)
Protein, ur: NEGATIVE mg/dL
Specific Gravity, Urine: 1.003 — ABNORMAL LOW (ref 1.005–1.030)

## 2018-03-12 NOTE — ED Notes (Signed)
Pt discharged by [employer], rn.

## 2018-03-12 NOTE — ED Notes (Signed)
Pt ambulated a short distance to Toilet in room with minimal help from staff. EDP aware.

## 2018-03-12 NOTE — ED Provider Notes (Addendum)
Vitals:   03/12/18 1900 03/12/18 1930  BP: (!) 168/75 (!) 156/62  Pulse: 68 (!) 58  Resp: 17   Temp:    SpO2: 99% 99%   Lab work, urinalysis reviewed.  Patient alert, no distress.  Clinical Course as of Mar 13 1951  Sat Mar 12, 2018  1717 Reevaluated the patient, currently resting comfortably.  Await her urinalysis.  The patient was able to ambulate with some assistance from the nurse to the bathroom and back.  On my exam she does not have any focal weakness to noted the lower extremities.  She is slightly weak in the lower extremities, but given her age and history suspect this does not appear off far from baseline and nothing appears focal at this time.    [MQ]  5726 Discussed with Daughter Precious Bard), advises patient has ambulatory issues at baseline (uses walker), has bakers cyst. Every once in a while patient seems to have episodes like this, not certain as to why. Daughter agreeable with discharge back to her care facility. Will discharge patient back into her care facility (caretakers) care.    [MQ]    Clinical Course User Index [MQ] Delman Kitten, MD       Delman Kitten, MD 03/12/18 Pollie Meyer    Delman Kitten, MD 03/12/18 [phone]

## 2018-03-12 NOTE — ED Provider Notes (Signed)
[name] Emergency Department Provider Note  ____________________________________________   I have reviewed the triage vital signs and the nursing notes. Where available I have reviewed prior notes and, if possible and indicated, outside hospital notes.    HISTORY  Chief Complaint Leg Pain    HPI [place] Laprade is a 84 y.o. female  was sent in here allegedly because her leg hurts.  She denies that her leg hurts.  I called the nursing facility where she states, they stated that she seemed unable to walk using that leg as well as normal today for reasons unknown.  Unclear time of onset.  Level 5 chart caveat; no further history available due to patient status.  Normally she is to ambulate very well.  Past Medical History:  Diagnosis Date  . Allergy   . Anemia   . Arthritis   . B12 deficiency 11/06/2015   Less than 400 October 2016  . Chronic constipation   . Dementia   . Dementia   . Family history of adverse reaction to anesthesia    brother, daughter and grandson had malignant hyperthermia in the past  . Glaucoma   . History of fibrocystic disease of breast    biopsy done, negative  . History of hand fracture    left  . History of shingles 2014  . History of wrist fracture    left  . Hypertension   . Impacted cerumen   . Malignant hyperthermia   . Pneumonia   . Postherpetic neuralgia   . TIA (transient ischemic attack) Feb 2016    Patient Active Problem List   Diagnosis Date Noted  . Dysphagia 11/26/2017  . PAC (premature atrial contraction) 09/02/2017  . Chronic pain syndrome 07/08/2017  . Urinary incontinence 10/03/2016  . Allergic rhinitis 11/29/2015  . B12 deficiency 11/06/2015  . Osteoporosis screening 11/01/2015  . Hypochloremia 09/29/2015  . Hyponatremia 09/29/2015  . Medication monitoring encounter 09/27/2015  . Bilateral leg edema 09/27/2015  . Dyslipidemia 06/27/2015  . CKD (chronic kidney disease) stage 3, GFR 30-59  ml/min (HCC) 03/28/2015  . Glaucoma   . Hypertension   . Dementia   . Anemia   . Arthritis   . Chronic constipation   . Postherpetic neuralgia   . Post herpetic neuralgia head 01/13/2015  . Bilateral occipital neuralgia 01/13/2015  . DDD (degenerative disc disease), lumbar 01/13/2015  . DJD (degenerative joint disease) of knee 01/13/2015  . TIA (transient ischemic attack) 10/01/2014    Past Surgical History:  Procedure Laterality Date  . BREAST LUMPECTOMY     unsure which side  . CATARACT EXTRACTION W/PHACO Right 01/12/2017   Procedure: CATARACT EXTRACTION PHACO AND INTRAOCULAR LENS PLACEMENT (IOC);  Surgeon: Birder Robson, MD;  Location: [facility] ORS;  Service: Ophthalmology;  Laterality: Right;  [place] 1:05.8 AP% 26.0 CDE 17.07 Fluid pack lot # [id number]  . ESOPHAGOGASTRODUODENOSCOPY (EGD) WITH PROPOFOL N/A 02/10/2018   Procedure: ESOPHAGOGASTRODUODENOSCOPY (EGD) WITH PROPOFOL;  Surgeon: Lin Landsman, MD;  Location: [facility] [facility];  Service: Gastroenterology;  Laterality: N/A;    Prior to Admission medications   Medication Sig Start Date End Date Taking? Authorizing Provider  acetaminophen (TYLENOL 8 HOUR ARTHRITIS PAIN) 650 MG CR tablet Take 650 mg by mouth 3 (three) times daily as needed.     [provider]  amLODipine (NORVASC) 5 MG tablet Take 1 tablet (5 mg total) by mouth daily. 09/02/17   [name], MD  aspirin EC 81 MG tablet Take 1 tablet (81  mg total) by mouth daily. 08/01/17   [name], MD  Bismuth Subsalicylate 761 PJ/09TO SUSP Take 30 mLs (1,050 mg total) by mouth 2 (two) times daily as needed. 09/02/17   Lada, Satira Anis, MD  brimonidine-timolol (COMBIGAN) 0.2-0.5 % ophthalmic solution Place 1 drop into both eyes 2 (two) times daily.    [provider]  carvedilol (COREG) 6.25 MG tablet TAKE ONE TABLET BY MOUTH TWO TIMES A DAY 04/06/17   Lada, Satira Anis, MD  citalopram (CELEXA) 10 MG tablet Take 1 tablet (10 mg total) by mouth daily. 09/28/16    [name], MD  Diapers & Supplies (HUGGIES PULL-UPS) MISC 1 each by Does not apply route 3 (three) times daily. Adult size medium, urinary and stool incontinence, LON 99 months 09/22/16   [name], MD  diclofenac sodium (VOLTAREN) 1 % GEL Apply 2 grams to each knee three times a day while awake, for example, 8 am, 2 pm, and 8 pm 09/22/16   Lada, Satira Anis, MD  divalproex (DEPAKOTE SPRINKLES) 125 MG capsule Take 125 mg by mouth 3 (three) times daily.  12/31/16   Lada, Satira Anis, MD  donepezil (ARICEPT) 10 MG tablet Take 1 tablet (10 mg total) by mouth at bedtime. 10/02/16   [name], MD  doxycycline (VIBRA-TABS) 100 MG tablet Take 1 tablet (100 mg total) by mouth 2 (two) times daily. Patient not taking: Reported on 03/09/2018 03/09/18   [name], MD  Elastic Bandages & Supports (MEDICAL COMPRESSION STOCKINGS) MISC Put stockings on every morning and remove every night; do not sleep in stockings; get the kind with open toes 02/03/18   Lada, Satira Anis, MD  fluticasone (FLONASE) 50 MCG/ACT nasal spray USE 2 SPRAYS IN BOTH NOSTRILS ONCE DAILYFOR RHINITIS 04/24/16   Lada, Satira Anis, MD  latanoprost (XALATAN) 0.005 % ophthalmic solution Place 1 drop into both eyes at bedtime.    [provider]  lisinopril (PRINIVIL,ZESTRIL) 20 MG tablet Take 1 tablet (20 mg total) by mouth daily. 11/04/17   Lada, Satira Anis, MD  meloxicam (MOBIC) 7.5 MG tablet Take 1 tablet (7.5 mg total) by mouth daily. For 30 days only Patient not taking: Reported on 03/09/2018 11/04/17   Gillis Santa, MD  montelukast (SINGULAIR) 5 MG chewable tablet CHEW 1 TABLET BY MOUTH AT BEDTIME 04/06/17   Lada, Satira Anis, MD  NAMENDA XR 28 MG CP24 24 hr capsule TAKE 1 CAPSULE BY MOUTH ONCE DAILY FOR MEMORY 05/13/16   Lada, Satira Anis, MD  omeprazole (PRILOSEC) 20 MG capsule Take 1 capsule (20 mg total) by mouth daily. 12/21/17   Vanga, Tally Due, MD  polyethylene glycol powder (GLYCOLAX/MIRALAX) powder MIX 17 GR (1 CAPFUL) IN 8  OZ WATER/JUICEAND DRINK ONCE DAILY AS NEEDED TO FOR CONSTIPATION 04/14/16   Lada, Satira Anis, MD  pravastatin (PRAVACHOL) 20 MG tablet Take 1 tablet (20 mg total) by mouth at bedtime. 11/04/17   [name], MD  pregabalin (LYRICA) 25 MG capsule 25 mg qhs every other night 11/04/17   Gillis Santa, MD  vitamin B-12 (CYANOCOBALAMIN) 500 MCG tablet TAKE ONE TABLET BY MOUTH EVERY DAY 08/26/17   Lada, Satira Anis, MD    Allergies Patient has no known allergies.  Family History  Family history unknown: Yes    Social History Social History   Tobacco Use  . Smoking status: Never Smoker  . Smokeless tobacco: Never Used  Substance Use Topics  . Alcohol use: No  Alcohol/week: 0.0 oz  . Drug use: No    Review of Systems Patient states "I feel pretty good".  Mumble #5 at her baseline per nursing home   ____________________________________________   PHYSICAL EXAM:  VITAL SIGNS: ED Triage Vitals [03/12/18 1405]  Enc Vitals Group     BP 125/60     Pulse      Resp      Temp 97.9 F (36.6 C)     Temp Source Oral     SpO2 100 %     Weight 127 lb (57.6 kg)     Height 5\' 5"  (1.651 m)     Head Circumference      Peak Flow      Pain Score 0     Pain Loc      Pain Edu?      Excl. in [place]?     Constitutional: Alert and oriented name unsure the date, very hard of hearing.  Follows commands.. Well appearing and in no acute distress. Eyes: Conjunctivae are normal Head: Atraumatic HEENT: No congestion/rhinnorhea. Mucous membranes are moist.  Oropharynx non-erythematous Neck:   Nontender with no meningismus, no masses, no stridor Cardiovascular: Normal rate, regular rhythm. Grossly normal heart sounds.  Good peripheral circulation. Respiratory: Normal respiratory effort.  No retractions. Lungs CTAB. Abdominal: Soft and nontender. No distention. No guarding no rebound Back:  There is no focal tenderness or step off.  there is no midline tenderness there are no lesions noted. there is no  CVA tenderness Musculoskeletal: No lower extremity tenderness, no upper extremity tenderness. No joint effusions, no DVT signs strong distal pulses no edema Neurologic:  Normal speech and language.  Patient seems to have intact sensation to the extent that I can determine both upper lower extremity, cranial nerves appear to be intact, speech appears to be at her baseline, she does have some difficulty raising and maintaining the right leg.  Is unclear if this is because of weakness, some other pathology.  There is no tenderness noted to the leg. Skin:  Skin is warm, dry and intact. No rash noted. Psychiatric: Mood and affect are normal. Speech and behavior are normal.  ____________________________________________   LABS (all labs ordered are listed, but only abnormal results are displayed)  Labs Reviewed - No data to display  Pertinent labs  results that were available during my care of the patient were reviewed by me and considered in my medical decision making (see chart for details). ____________________________________________  EKG  I personally interpreted any EKGs ordered by me or triage  ____________________________________________  RADIOLOGY  Pertinent labs & imaging results that were available during my care of the patient were reviewed by me and considered in my medical decision making (see chart for details). If possible, patient and/or family made aware of any abnormal findings.  No results found. ____________________________________________    PROCEDURES  Procedure(s) performed: None  Procedures  Critical Care performed: None  ____________________________________________   INITIAL IMPRESSION / ASSESSMENT AND PLAN / ED COURSE  Pertinent labs & imaging results that were available during my care of the patient were reviewed by me and considered in my medical decision making (see chart for details).  Patient here with difficulty walking, she was able to walk  with some mild assistance to the bathroom.  She has no acute complaints and we do not know time of onset.  She does appear possibly to be somewhat weaker in the right lower leg we will initiate CVA work-up and  reassess.    ____________________________________________   FINAL CLINICAL IMPRESSION(S) / ED DIAGNOSES  Final diagnoses:  None      This chart was dictated using voice recognition software.  Despite best efforts to proofread,  errors can occur which can change meaning.      Schuyler Amor, MD 03/12/18 1459

## 2018-03-12 NOTE — ED Notes (Signed)
.   Pt is resting, Respirations even and unlabored, NAD. Stretcher lowest postion and locked. Call bell within reach. Denies any needs at this time RN will continue to monitor.    

## 2018-03-12 NOTE — ED Triage Notes (Signed)
Per [facility] complaint:R leg pain  from: [place]  pt woke up limited use not able to stand.Pt was here yesterday.   hx dementia

## 2018-03-14 ENCOUNTER — Other Ambulatory Visit: Payer: Self-pay | Admitting: Nurse Practitioner

## 2018-03-14 DIAGNOSIS — N6320 Unspecified lump in the left breast, unspecified quadrant: Secondary | ICD-10-CM

## 2018-03-14 DIAGNOSIS — R928 Other abnormal and inconclusive findings on diagnostic imaging of breast: Secondary | ICD-10-CM

## 2018-03-16 ENCOUNTER — Other Ambulatory Visit: Payer: Self-pay

## 2018-03-16 ENCOUNTER — Ambulatory Visit: Payer: Medicare Other | Admitting: Gastroenterology

## 2018-03-21 ENCOUNTER — Encounter: Payer: Self-pay | Admitting: Gastroenterology

## 2018-03-21 ENCOUNTER — Ambulatory Visit (INDEPENDENT_AMBULATORY_CARE_PROVIDER_SITE_OTHER): Payer: Medicare Other | Admitting: Gastroenterology

## 2018-03-21 VITALS — BP 153/58 | HR 61 | Resp 17 | Ht 65.0 in | Wt 140.4 lb

## 2018-03-21 DIAGNOSIS — R1313 Dysphagia, pharyngeal phase: Secondary | ICD-10-CM | POA: Diagnosis not present

## 2018-03-21 NOTE — Progress Notes (Signed)
Melissa Darby, MD [address]  [place]  [place], [place] [address]  Main: [phone]  Fax: [phone]    Gastroenterology Consultation  Referring Provider:     [name], MD Primary Care Physician:  [name], MD Primary Gastroenterologist:  Dr. Cephas Combs Reason for Consultation:     dysphagia        HPI:   Melissa Combs is a 84 y.o. female referred by Dr. [name], MD  for consultation & management of dysphagia. Patient has history of severe dementia, is accompanied by her caregiver. Patient lives in a group home. Her caregiver noticed that patient thumps on her chest every time she eats. But she does not choke or regurgitate. She denies any complaints with liquids. The current caregiver has been taking care of her for the past 1 year. She is giving her chopped food. She denies nocturnal awakenings due to chest pain or regurgitation. She did not lose weight. She is not on acid suppression medication. She has regular bowel movements. She is wondering if this is secondary to acid reflux or secondary to her progressive dementia.  Follow up visit 02/04/18 She is on omeprazole 20mg  daily, symptoms remain the same. Lost about 5-7lbs since last visit. X Ray esophagus revealed mild reflux and small developing diverticulum   Follow up visit 03/21/2018 She underwent EGD which was unremarkable except for mild resistance at the upper esophageal sphincter while passing the scope probably due to stiff neck and cervical osteophytes. According to her caregiver who accompanied her today reports that patient is doing well with swallowing. She gained few pounds since last visit. She is recently found to have left breast mass and is scheduled to undergo biopsy  NSAIDs: aspirin 81 and meloxicam daily  Antiplts/Anticoagulants/Anti thrombotics: aspirin 81  GI Procedures:  EGD 03/21/18 Findings: The entire examined stomach was normal. The cardia and gastric fundus  were normal on retroflexion. The gastroesophageal junction and examined esophagus were normal. The duodenal bulb and second portion of the duodenum were normal. There was mild resistance at the upper esophageal sphincter while passing scope secondary to extrinsic compression from stiff neck and possible cervical osteophytes  Past Medical History:  Diagnosis Date  . Allergy   . Anemia   . Arthritis   . B12 deficiency 11/06/2015   Less than 400 October 2016  . Chronic constipation   . Dementia   . Dementia   . Family history of adverse reaction to anesthesia    brother, daughter and grandson had malignant hyperthermia in the past  . Glaucoma   . History of fibrocystic disease of breast    biopsy done, negative  . History of hand fracture    left  . History of shingles 2014  . History of wrist fracture    left  . Hypertension   . Impacted cerumen   . Malignant hyperthermia   . Pneumonia   . Postherpetic neuralgia   . TIA (transient ischemic attack) Feb 2016    Past Surgical History:  Procedure Laterality Date  . BREAST LUMPECTOMY     unsure which side  . CATARACT EXTRACTION W/PHACO Right 01/12/2017   Procedure: CATARACT EXTRACTION PHACO AND INTRAOCULAR LENS PLACEMENT (IOC);  Surgeon: Birder Robson, MD;  Location: [facility] OR[facility];  Service: Ophthalmology;  Laterality: Right;  [place] 1:05.8 AP% 26.0 CDE 17.07 Fluid pack lot # [id number]  . ESOPHAGOGASTRODUODENOSCOPY (EGD) WITH PROPOFOL N/A 02/10/2018   Procedure: ESOPHAGOGASTRODUODENOSCOPY (EGD) WITH PROPOFOL;  Surgeon: Lin Landsman, MD;  Location: [facility];  Service: Gastroenterology;  Laterality: N/A;     Current Outpatient Medications:  .  acetaminophen (TYLENOL 8 HOUR ARTHRITIS PAIN) 650 MG CR tablet, Take 650 mg by mouth 3 (three) times daily as needed. , Disp: , Rfl:  .  amLODipine (NORVASC) 5 MG tablet, Take 1 tablet (5 mg total) by mouth daily., Disp: 90 tablet, Rfl: 3 .  aspirin EC 81 MG tablet, Take 1 tablet (81  mg total) by mouth daily., Disp: 30 tablet, Rfl: 11 .  Bismuth Subsalicylate 166 AY/30ZS SUSP, Take 30 mLs (1,050 mg total) by mouth 2 (two) times daily as needed., Disp: , Rfl:  .  brimonidine-timolol (COMBIGAN) 0.2-0.5 % ophthalmic solution, Place 1 drop into both eyes 2 (two) times daily., Disp: , Rfl:  .  carvedilol (COREG) 6.25 MG tablet, TAKE ONE TABLET BY MOUTH TWO TIMES A DAY, Disp: 60 tablet, Rfl: 11 .  citalopram (CELEXA) 10 MG tablet, Take 1 tablet (10 mg total) by mouth daily., Disp: 30 tablet, Rfl: 11 .  Diapers & Supplies (HUGGIES PULL-UPS) MISC, 1 each by Does not apply route 3 (three) times daily. Adult size medium, urinary and stool incontinence, LON 99 months, Disp: 100 each, Rfl: 5 .  diclofenac sodium (VOLTAREN) 1 % GEL, Apply 2 grams to each knee three times a day while awake, for example, 8 am, 2 pm, and 8 pm, Disp: 100 g, Rfl: 5 .  divalproex (DEPAKOTE SPRINKLES) 125 MG capsule, Take 125 mg by mouth 3 (three) times daily. , Disp: , Rfl:  .  divalproex (DEPAKOTE) 125 MG DR tablet, Take 125 mg by mouth daily as needed. May take additional 4th tablet as needed to go along with 3 times daily order., Disp: , Rfl:  .  donepezil (ARICEPT) 10 MG tablet, Take 1 tablet (10 mg total) by mouth at bedtime., Disp: 30 tablet, Rfl: 11 .  doxycycline (VIBRA-TABS) 100 MG tablet, Take 1 tablet (100 mg total) by mouth 2 (two) times daily., Disp: 14 tablet, Rfl: 0 .  Elastic Bandages & Supports (MEDICAL COMPRESSION STOCKINGS) MISC, Put stockings on every morning and remove every night; do not sleep in stockings; get the kind with open toes, Disp: 2 each, Rfl: 0 .  fluticasone (FLONASE) 50 MCG/ACT nasal spray, USE 2 SPRAYS IN BOTH NOSTRILS ONCE DAILYFOR RHINITIS, Disp: 16 g, Rfl: 11 .  latanoprost (XALATAN) 0.005 % ophthalmic solution, Place 1 drop into both eyes at bedtime., Disp: , Rfl:  .  lisinopril (PRINIVIL,ZESTRIL) 20 MG tablet, Take 1 tablet (20 mg total) by mouth daily., Disp: 30 tablet, Rfl:  3 .  meloxicam (MOBIC) 7.5 MG tablet, Take 1 tablet (7.5 mg total) by mouth daily. For 30 days only, Disp: 30 tablet, Rfl: 0 .  montelukast (SINGULAIR) 5 MG chewable tablet, CHEW 1 TABLET BY MOUTH AT BEDTIME, Disp: 30 tablet, Rfl: 11 .  NAMENDA XR 28 MG CP24 24 hr capsule, TAKE 1 CAPSULE BY MOUTH ONCE DAILY FOR MEMORY, Disp: 30 capsule, Rfl: 11 .  omeprazole (PRILOSEC) 20 MG capsule, Take 1 capsule (20 mg total) by mouth daily., Disp: 30 capsule, Rfl: 3 .  polyethylene glycol powder (GLYCOLAX/MIRALAX) powder, MIX 17 GR (1 CAPFUL) IN 8 OZ WATER/JUICEAND DRINK ONCE DAILY AS NEEDED TO FOR CONSTIPATION, Disp: 527 g, Rfl: 11 .  pravastatin (PRAVACHOL) 20 MG tablet, Take 1 tablet (20 mg total) by mouth at bedtime., Disp: 30 tablet, Rfl: 3 .  pregabalin (LYRICA) 25 MG  capsule, 25 mg qhs every other night, Disp: 30 capsule, Rfl: 2 .  vitamin B-12 (CYANOCOBALAMIN) 500 MCG tablet, TAKE ONE TABLET BY MOUTH EVERY DAY, Disp: 28 tablet, Rfl: 12   Family History  Family history unknown: Yes     Social History   Tobacco Use  . Smoking status: Never Smoker  . Smokeless tobacco: Never Used  Substance Use Topics  . Alcohol use: No    Alcohol/week: 0.0 oz  . Drug use: No    Allergies as of 03/21/2018  . (No Known Allergies)    Review of Systems:    All systems reviewed and negative except where noted in HPI.   Physical Exam:  BP (!) 153/58 (BP Location: Left Arm, Patient Position: Sitting, Cuff Size: Normal)   Pulse 61   Resp 17   Ht 5\' 5"  (1.651 m)   Wt 140 lb 6.4 oz (63.7 kg)   BMI 23.36 kg/m  No LMP recorded. Patient is postmenopausal.  General:   Alert,  Moderately built, moderately nourished for her age, pleasant and cooperative in NAD Head:  Normocephalic and atraumatic. Eyes:  Sclera clear, no icterus.   Conjunctiva pink. Ears:  Normal auditory acuity. Nose:  No deformity, discharge, or lesions. Mouth:  No deformity or lesions,oropharynx pink & moist, no evidence of thrush Neck:   Supple; no masses or thyromegaly. Lungs:  Respirations even and unlabored.  Clear throughout to auscultation.   No wheezes, crackles, or rhonchi. No acute distress. Heart:  Regular rate and rhythm; no murmurs, clicks, rubs, or gallops. Abdomen:  Normal bowel sounds. Soft, non-tender and non-distended without masses, hepatosplenomegaly or hernias noted.  No guarding or rebound tenderness.   Rectal: Not performed Msk:  Symmetrical without gross deformities. Good, equal movement & strength bilaterally. Pulses:  Normal pulses noted. Extremities:  No clubbing or edema.  No cyanosis. Neurologic:  Alert and oriented x3;  grossly normal neurologically. Skin:  Intact without significant lesions or rashes. No jaundice. Psych:  Alert and cooperative. Normal mood and affect.  Imaging Studies: DG esophagus 12/2017 IMPRESSION: 1. Prominent cricopharyngeus muscle with a small developing Zenker's diverticulum. 2. Mild gastroesophageal reflux.  Assessment and Plan:   Luisana Lutzke is a 84 y.o. black female with history of dementia, here for follow-up of dysphagia to solids. X ray esophagus revealed mild reflux. EGD was fairly unremarkable except for mild resistance at the upper esophageal sphincter while passing the scope probably due to stiff neck and cervical osteophytes. She bends her neck while eating solid food without any difficulty. Weight loss stopped  - continue omeprazole 20 mg daily No further workup from GI standpoint   Follow up as needed   Melissa Darby, MD

## 2018-03-24 ENCOUNTER — Ambulatory Visit
Admission: RE | Admit: 2018-03-24 | Discharge: 2018-03-24 | Disposition: A | Discharge status: Home / Self Care | Payer: Medicare Other | Source: Ambulatory Visit | Attending: Nurse Practitioner | Admitting: Nurse Practitioner

## 2018-03-24 DIAGNOSIS — N6320 Unspecified lump in the left breast, unspecified quadrant: Secondary | ICD-10-CM

## 2018-03-24 DIAGNOSIS — R928 Other abnormal and inconclusive findings on diagnostic imaging of breast: Secondary | ICD-10-CM | POA: Diagnosis not present

## 2018-03-24 DIAGNOSIS — N6321 Unspecified lump in the left breast, upper outer quadrant: Secondary | ICD-10-CM | POA: Diagnosis not present

## 2018-03-24 DIAGNOSIS — C50412 Malignant neoplasm of upper-outer quadrant of left female breast: Secondary | ICD-10-CM | POA: Diagnosis not present

## 2018-03-24 HISTORY — PX: BREAST BIOPSY: SHX20

## 2018-03-25 ENCOUNTER — Other Ambulatory Visit: Payer: Self-pay | Admitting: Pathology

## 2018-03-29 ENCOUNTER — Encounter: Payer: Self-pay | Admitting: *Deleted

## 2018-03-29 NOTE — Progress Notes (Signed)
  Oncology Nurse Navigator Documentation  Navigator Location: [facility] (03/29/18 1700) Referral date to RadOnc/MedOnc: 04/01/18 (03/29/18 1700) )Navigator Encounter Type: Introductory phone call (03/29/18 1700)   Abnormal Finding Date: 03/11/18 (03/29/18 1700) Confirmed Diagnosis Date: 03/28/18 (03/29/18 1700)                 Treatment Phase: Pre-Tx/Tx Discussion (03/29/18 1700) Barriers/Navigation Needs: Education;Coordination of Care (03/29/18 1700)   Interventions: Coordination of Care (03/29/18 1700)   Coordination of Care: Appts (03/29/18 1700)                  Time Spent with Patient: 45 (03/29/18 1700)   Called patient's daughter Blythe Stanford, who has patient's POA and discussed need for surgical and medical oncology consult.  Daughter had been notified of patients diagnosis.  Patient had dementia and is currently living at an assisted living facility.  The daughter lives in [place] and is traveling home today to see her mom.  She would like to have appointments scheduled before she leaves to to back next week.  I have scheduled the patient to see Dr. Hedwig Morton on Friday at 10:00 and Dr. Clerance Lav on Monday at 1:30.  The daugher and Jocelyn at the assisted living facility is aware of the patients appointments.  Daughter was informed to call if she has any questions or needs.

## 2018-04-01 ENCOUNTER — Encounter: Payer: Self-pay | Admitting: Oncology

## 2018-04-01 ENCOUNTER — Encounter: Payer: Self-pay | Admitting: *Deleted

## 2018-04-01 ENCOUNTER — Inpatient Hospital Stay: Payer: Medicare Other | Attending: Oncology | Admitting: Oncology

## 2018-04-01 VITALS — BP 152/63 | HR 60 | Temp 97.0°F | Resp 18 | Ht 65.0 in | Wt 134.8 lb

## 2018-04-01 DIAGNOSIS — Z79811 Long term (current) use of aromatase inhibitors: Secondary | ICD-10-CM | POA: Insufficient documentation

## 2018-04-01 DIAGNOSIS — Z170 Estrogen receptor positive status [ER+]: Secondary | ICD-10-CM | POA: Diagnosis not present

## 2018-04-01 DIAGNOSIS — Z7189 Other specified counseling: Secondary | ICD-10-CM

## 2018-04-01 DIAGNOSIS — Z171 Estrogen receptor negative status [ER-]: Secondary | ICD-10-CM

## 2018-04-01 DIAGNOSIS — C50912 Malignant neoplasm of unspecified site of left female breast: Secondary | ICD-10-CM

## 2018-04-01 DIAGNOSIS — F0390 Unspecified dementia without behavioral disturbance: Secondary | ICD-10-CM | POA: Diagnosis not present

## 2018-04-01 DIAGNOSIS — R54 Age-related physical debility: Secondary | ICD-10-CM | POA: Insufficient documentation

## 2018-04-01 DIAGNOSIS — Z780 Asymptomatic menopausal state: Secondary | ICD-10-CM

## 2018-04-01 DIAGNOSIS — C50412 Malignant neoplasm of upper-outer quadrant of left female breast: Secondary | ICD-10-CM | POA: Diagnosis not present

## 2018-04-01 NOTE — Progress Notes (Addendum)
Hematology/Oncology Consult note [facility] [phone] [phone]  Patient Care Team: [name], MD as PCP - General (Family Medicine) Mohammed Kindle, MD as Attending Physician (Pain Medicine) Anabel Bene, MD as Referring Physician (Neurology)   Name of the patient: Melissa Combs  [id number]  August 18, 1930    Reason for referral- left breast mass   Referring physician- Dr. Sanda Klein  Date of visit: 04/01/18   History of presenting illness-patient is a 84 year old female with a past medical history significant for dementia, hypertension, hyperlipidemia and chronic kidney disease among other medical problems.  She has been a resident of group home since 2015 due to her gradually worsening dementia.  Her only close family currently is her daughter who lives in [place] and is with her today along with her caregiver from group home.  With regards to her dementia patient is able to ambulate with the help of a walker.  She can carry out conversations and is not incontinent of her urine and stool.  She is able to eat without any aspiration issues and overall she has been doing well at the nursing home without any recent hospitalizations.  Her caregiver noticed a left breast mass recently which prompted a mammogram.  Mammogram showed a 5.1 cm mass in the 2 o'clock position of the left breast.  No evidence of axillary adenopathy.  No evidence of breast masses in the right breast.  Patient has had a prior breast biopsy and surgery in the 1970s which the daughter says was not malignancy.  No other family history of breast cancer, ovarian cancer, colon or prostate cancer.  There is a family history of malignant hyperthermia with nitrous oxide.  Patient is currently pleasantly confused.  She is not aware about her underlying medical condition and history is obtained as above with the help of her daughter and caregiver.  Overall her appetite is good  and she has not had any unintentional weight loss.  She does not complain of any pain today  Patient underwent core biopsy which showed invasive mammary carcinoma, grade 2, ER greater than 90% positive, PR 1% positive and HER-2 equivocal by IHC.  FISH testing is currently pending  ECOG PS- 2  Pain scale- 0   Review of systems- Review of Systems  Unable to perform ROS: Dementia    No Known Allergies  Patient Active Problem List   Diagnosis Date Noted  . Dysphagia 11/26/2017  . PAC (premature atrial contraction) 09/02/2017  . Chronic pain syndrome 07/08/2017  . Urinary incontinence 10/03/2016  . Allergic rhinitis 11/29/2015  . B12 deficiency 11/06/2015  . Osteoporosis screening 11/01/2015  . Hypochloremia 09/29/2015  . Hyponatremia 09/29/2015  . Medication monitoring encounter 09/27/2015  . Bilateral leg edema 09/27/2015  . Dyslipidemia 06/27/2015  . CKD (chronic kidney disease) stage 3, GFR 30-59 ml/min (HCC) 03/28/2015  . Glaucoma   . Hypertension   . Dementia   . Anemia   . Arthritis   . Chronic constipation   . Postherpetic neuralgia   . Post herpetic neuralgia head 01/13/2015  . Bilateral occipital neuralgia 01/13/2015  . DDD (degenerative disc disease), lumbar 01/13/2015  . DJD (degenerative joint disease) of knee 01/13/2015  . TIA (transient ischemic attack) 10/01/2014     Past Medical History:  Diagnosis Date  . Allergy   . Anemia   . Arthritis   . B12 deficiency 11/06/2015   Less than 400 October 2016  . Chronic constipation   . Dementia   .  Dementia   . Family history of adverse reaction to anesthesia    brother, daughter and grandson had malignant hyperthermia in the past  . Glaucoma   . History of fibrocystic disease of breast    biopsy done, negative  . History of hand fracture    left  . History of shingles 2014  . History of wrist fracture    left  . Hypertension   . Impacted cerumen   . Malignant hyperthermia   . Pneumonia   .  Postherpetic neuralgia   . TIA (transient ischemic attack) Feb 2016     Past Surgical History:  Procedure Laterality Date  . BREAST BIOPSY Left 03/24/2018   path pending  . BREAST LUMPECTOMY     unsure which side  . CATARACT EXTRACTION W/PHACO Right 01/12/2017   Procedure: CATARACT EXTRACTION PHACO AND INTRAOCULAR LENS PLACEMENT (IOC);  Surgeon: Birder Robson, MD;  Location: [facility];  Service: Ophthalmology;  Laterality: Right;  [place] 1:05.8 AP% 26.0 CDE 17.07 Fluid pack lot # [id number]  . ESOPHAGOGASTRODUODENOSCOPY (EGD) WITH PROPOFOL N/A 02/10/2018   Procedure: ESOPHAGOGASTRODUODENOSCOPY (EGD) WITH PROPOFOL;  Surgeon: Lin Landsman, MD;  Location: [facility];  Service: Gastroenterology;  Laterality: N/A;    Social History   Socioeconomic History  . Marital status: Single    Spouse name: Not on file  . Number of children: Not on file  . Years of education: Not on file  . Highest education level: Not on file  Occupational History  . Not on file  Social Needs  . Financial resource strain: Not on file  . Food insecurity:    Worry: Not on file    Inability: Not on file  . Transportation needs:    Medical: Not on file    Non-medical: Not on file  Tobacco Use  . Smoking status: Never Smoker  . Smokeless tobacco: Never Used  Substance and Sexual Activity  . Alcohol use: No    Alcohol/week: 0.0 oz  . Drug use: No  . Sexual activity: Never  Lifestyle  . Physical activity:    Days per week: Not on file    Minutes per session: Not on file  . Stress: Not on file  Relationships  . Social connections:    Talks on phone: Not on file    Gets together: Not on file    Attends religious service: Not on file    Active member of club or organization: Not on file    Attends meetings of clubs or organizations: Not on file    Relationship status: Not on file  . Intimate partner violence:    Fear of current or ex partner: Not on file    Emotionally abused: Not on file     Physically abused: Not on file    Forced sexual activity: Not on file  Other Topics Concern  . Not on file  Social History Narrative  . Not on file     Family History  Family history unknown: Yes     Current Outpatient Medications:  .  amLODipine (NORVASC) 5 MG tablet, Take 1 tablet (5 mg total) by mouth daily., Disp: 90 tablet, Rfl: 3 .  aspirin EC 81 MG tablet, Take 1 tablet (81 mg total) by mouth daily., Disp: 30 tablet, Rfl: 11 .  brimonidine-timolol (COMBIGAN) 0.2-0.5 % ophthalmic solution, Place 1 drop into both eyes 2 (two) times daily., Disp: , Rfl:  .  carvedilol (COREG) 6.25 MG tablet, TAKE ONE TABLET BY MOUTH  TWO TIMES A DAY, Disp: 60 tablet, Rfl: 11 .  citalopram (CELEXA) 10 MG tablet, Take 1 tablet (10 mg total) by mouth daily., Disp: 30 tablet, Rfl: 11 .  divalproex (DEPAKOTE) 125 MG DR tablet, Take 125 mg by mouth daily as needed. May take additional 4th tablet as needed to go along with 3 times daily order., Disp: , Rfl:  .  donepezil (ARICEPT) 10 MG tablet, Take 1 tablet (10 mg total) by mouth at bedtime., Disp: 30 tablet, Rfl: 11 .  fluticasone (FLONASE) 50 MCG/ACT nasal spray, USE 2 SPRAYS IN BOTH NOSTRILS ONCE DAILYFOR RHINITIS, Disp: 16 g, Rfl: 11 .  latanoprost (XALATAN) 0.005 % ophthalmic solution, Place 1 drop into both eyes at bedtime., Disp: , Rfl:  .  lisinopril (PRINIVIL,ZESTRIL) 20 MG tablet, Take 1 tablet (20 mg total) by mouth daily., Disp: 30 tablet, Rfl: 3 .  meloxicam (MOBIC) 7.5 MG tablet, Take 1 tablet (7.5 mg total) by mouth daily. For 30 days only, Disp: 30 tablet, Rfl: 0 .  montelukast (SINGULAIR) 5 MG chewable tablet, CHEW 1 TABLET BY MOUTH AT BEDTIME, Disp: 30 tablet, Rfl: 11 .  NAMENDA XR 28 MG CP24 24 hr capsule, TAKE 1 CAPSULE BY MOUTH ONCE DAILY FOR MEMORY, Disp: 30 capsule, Rfl: 11 .  omeprazole (PRILOSEC) 20 MG capsule, Take 1 capsule (20 mg total) by mouth daily., Disp: 30 capsule, Rfl: 3 .  polyethylene glycol powder (GLYCOLAX/MIRALAX)  powder, MIX 17 GR (1 CAPFUL) IN 8 OZ WATER/JUICEAND DRINK ONCE DAILY AS NEEDED TO FOR CONSTIPATION, Disp: 527 g, Rfl: 11 .  pravastatin (PRAVACHOL) 20 MG tablet, Take 1 tablet (20 mg total) by mouth at bedtime., Disp: 30 tablet, Rfl: 3 .  pregabalin (LYRICA) 25 MG capsule, 25 mg qhs every other night, Disp: 30 capsule, Rfl: 2 .  vitamin B-12 (CYANOCOBALAMIN) 500 MCG tablet, TAKE ONE TABLET BY MOUTH EVERY DAY, Disp: 28 tablet, Rfl: 12 .  acetaminophen (TYLENOL 8 HOUR ARTHRITIS PAIN) 650 MG CR tablet, Take 650 mg by mouth 3 (three) times daily as needed. , Disp: , Rfl:  .  Bismuth Subsalicylate 128 NO/67EH SUSP, Take 30 mLs (1,050 mg total) by mouth 2 (two) times daily as needed., Disp: , Rfl:  .  Diapers & Supplies (HUGGIES PULL-UPS) MISC, 1 each by Does not apply route 3 (three) times daily. Adult size medium, urinary and stool incontinence, LON 99 months (Patient not taking: Reported on 04/01/2018), Disp: 100 each, Rfl: 5 .  diclofenac sodium (VOLTAREN) 1 % GEL, Apply 2 grams to each knee three times a day while awake, for example, 8 am, 2 pm, and 8 pm (Patient not taking: Reported on 04/01/2018), Disp: 100 g, Rfl: 5 .  divalproex (DEPAKOTE SPRINKLES) 125 MG capsule, Take 125 mg by mouth 3 (three) times daily. , Disp: , Rfl:  .  doxycycline (VIBRA-TABS) 100 MG tablet, Take 1 tablet (100 mg total) by mouth 2 (two) times daily. (Patient not taking: Reported on 04/01/2018), Disp: 14 tablet, Rfl: 0 .  Elastic Bandages & Supports (MEDICAL COMPRESSION STOCKINGS) MISC, Put stockings on every morning and remove every night; do not sleep in stockings; get the kind with open toes (Patient not taking: Reported on 04/01/2018), Disp: 2 each, Rfl: 0   Physical exam:  Vitals:   04/01/18 1020  BP: (!) 152/63  Pulse: 60  Resp: 18  Temp: (!) 97 F (36.1 C)  TempSrc: Tympanic  Weight: 134 lb 12.8 oz (61.1 kg)  Height: 5' 5"  (1.651 m)  Physical Exam  Constitutional:  Frail elderly woman sitting in a wheelchair.   She does not appear to be in any acute distress. Pleasantly confused.   HENT:  Head: Normocephalic and atraumatic.  Eyes: Pupils are equal, round, and reactive to light. EOM are normal.  Neck: Normal range of motion.  Cardiovascular: Normal rate, regular rhythm and normal heart sounds.  Pulmonary/Chest: Effort normal and breath sounds normal.  Abdominal: Soft. Bowel sounds are normal.  Musculoskeletal: She exhibits no edema.  Neurological:  Oriented to self and person only  Skin: Skin is warm and dry.  Breast exam could not be performed on the examination table and was performed in her wheelchair in sitting position.  There is a large breast mass about 6 cm in size palpable across 11:00 to 2 o'clock position in her left breast.  No palpable axillary adenopathy    CMP Latest Ref Rng & Units 03/12/2018  Glucose 70 - 99 mg/dL 92  BUN 8 - 23 mg/dL 17  Creatinine 0.44 - 1.00 mg/dL 0.60  Sodium 135 - 145 mmol/L 140  Potassium 3.5 - 5.1 mmol/L 3.8  Chloride 98 - 111 mmol/L 98  CO2 22 - 32 mmol/L 34(H)  Calcium 8.9 - 10.3 mg/dL 8.9  Total Protein 6.5 - 8.1 g/dL 7.3  Total Bilirubin 0.3 - 1.2 mg/dL 0.5  Alkaline Phos 38 - 126 U/L 42  AST 15 - 41 U/L 17  ALT 0 - 44 U/L 7   CBC Latest Ref Rng & Units 03/12/2018  WBC 3.6 - 11.0 K/uL 10.2  Hemoglobin 12.0 - 16.0 g/dL 10.9(L)  Hematocrit 35.0 - 47.0 % 32.8(L)  Platelets 150 - 440 K/uL 226    No images are attached to the encounter.  Ct Head Wo Contrast  Result Date: 03/12/2018 CLINICAL DATA:  Pt very poor historian. Per note in chart "Per [facility] complaint:R leg pain from: [place] pt woke up limited use not able to stand.Pt was here yesterday. History of dementia. EXAM: CT HEAD WITHOUT CONTRAST TECHNIQUE: Contiguous axial images were obtained from the base of the skull through the vertex without intravenous contrast. COMPARISON:  None. FINDINGS: Brain: There is significant central and cortical atrophy. Periventricular white  matter changes are consistent with small vessel disease. There is encephalomalacia in the LEFT frontal lobe consistent with remote infarct. There is no intra or extra-axial fluid collection or mass lesion. The basilar cisterns and ventricles have a normal appearance. There is no CT evidence for acute infarction or hemorrhage. Vascular: There is atherosclerotic calcification of the internal carotid arteries. Skull: Normal. Negative for fracture or focal lesion. Sinuses/Orbits: No acute finding. Other: None. IMPRESSION: 1.  No evidence for acute intracranial abnormality. 2. Atrophy and small vessel disease. 3. LEFT frontal lobe encephalomalacia consistent with remote infarct. Electronically Signed   By: Nolon Nations M.D.   On: 03/12/2018 15:43   US Breast Ltd Uni Left Inc Axilla  Result Date: 03/11/2018 CLINICAL DATA:  Palpable left breast mass. EXAM: DIGITAL DIAGNOSTIC BILATERAL MAMMOGRAM WITH CAD AND TOMO ULTRASOUND LEFT BREAST COMPARISON:  Previous exam(s). ACR Breast Density Category b: There are scattered areas of fibroglandular density. FINDINGS: No suspicious mass, malignant type microcalcifications or distortion detected in the right breast. In the anterior third of the upper left breast is an obscured mass with distortion and dystrophic calcifications. Mammographic images were processed with CAD. On physical exam, there is a firm palpable mass in the left breast at 2 o'clock 1 cm from the nipple.  Targeted ultrasound is performed, showing a heterogeneous predominantly hypoechoic mass in the left breast at 2 o'clock 1 cm from the nipple measuring 5.1 x 3.6 x 4.8 cm. Heterogeneous coarse calcifications are seen within the mass. Sonographic evaluation of the left axilla does not show any enlarged adenopathy. IMPRESSION: Suspicious left breast mass. RECOMMENDATION: Ultrasound-guided core biopsy of the left breast mass is recommended. I have discussed the findings and recommendations with the patient.  Results were also provided in writing at the conclusion of the visit. If applicable, a reminder letter will be sent to the patient regarding the next appointment. BI-RADS CATEGORY  5: Highly suggestive of malignancy. Electronically Signed   By: Lillia Mountain M.D.   On: 03/11/2018 12:15   Mm Diag Breast Tomo Bilateral  Result Date: 03/11/2018 CLINICAL DATA:  Palpable left breast mass. EXAM: DIGITAL DIAGNOSTIC BILATERAL MAMMOGRAM WITH CAD AND TOMO ULTRASOUND LEFT BREAST COMPARISON:  Previous exam(s). ACR Breast Density Category b: There are scattered areas of fibroglandular density. FINDINGS: No suspicious mass, malignant type microcalcifications or distortion detected in the right breast. In the anterior third of the upper left breast is an obscured mass with distortion and dystrophic calcifications. Mammographic images were processed with CAD. On physical exam, there is a firm palpable mass in the left breast at 2 o'clock 1 cm from the nipple. Targeted ultrasound is performed, showing a heterogeneous predominantly hypoechoic mass in the left breast at 2 o'clock 1 cm from the nipple measuring 5.1 x 3.6 x 4.8 cm. Heterogeneous coarse calcifications are seen within the mass. Sonographic evaluation of the left axilla does not show any enlarged adenopathy. IMPRESSION: Suspicious left breast mass. RECOMMENDATION: Ultrasound-guided core biopsy of the left breast mass is recommended. I have discussed the findings and recommendations with the patient. Results were also provided in writing at the conclusion of the visit. If applicable, a reminder letter will be sent to the patient regarding the next appointment. BI-RADS CATEGORY  5: Highly suggestive of malignancy. Electronically Signed   By: Lillia Mountain M.D.   On: 03/11/2018 12:15   Mm Clip Placement Left  Result Date: 03/24/2018 CLINICAL DATA:  Evaluate clip placement following ultrasound-guided LEFT breast biopsy. EXAM: DIAGNOSTIC LEFT MAMMOGRAM POST ULTRASOUND  BIOPSY COMPARISON:  Previous exam(s). FINDINGS: Mammographic images were obtained following ultrasound guided biopsy of the 5.1 cm mass within the UPPER-OUTER LEFT breast. The RIBBON shaped clip is in satisfactory position. IMPRESSION: Satisfactory RIBBON clip position following ultrasound-guided LEFT breast biopsy. Final Assessment: Post Procedure Mammograms for Marker Placement Electronically Signed   By: Margarette Canada M.D.   On: 03/24/2018 14:08   Dg Hip Unilat With Pelvis 2-3 Views Right  Result Date: 03/12/2018 CLINICAL DATA:  Per physician notes - Pt was sent in here allegedly because her leg hurts. She denies that her leg hurts. I called the nursing facility where she states, they stated that she seemed unable to walk using that leg as well as normal today for reasons unknown. Unclear time of onset. EXAM: DG HIP (WITH OR WITHOUT PELVIS) 2-3V RIGHT COMPARISON:  None. FINDINGS: No fracture.  No bone lesion. Hip joints are normally aligned as are the SI joints and symphysis pubis. Bones are diffusely demineralized. There are disc degenerative changes of the lower lumbar spine. Soft tissues are unremarkable. IMPRESSION: No fracture or dislocation. Electronically Signed   By: Lajean Manes M.D.   On: 03/12/2018 15:52   [place] Lt Breast Bx W Loc Dev 1st Lesion Img Bx Spec US Guide  Addendum Date: 03/28/2018   ADDENDUM REPORT: 03/28/2018 11:46 ADDENDUM: PATHOLOGY ADDENDUM: Pathology: Invasive mammary carcinoma, grade 2. Pathology concordant with imaging findings: Yes Recommendation: Surgical referral. Localization/excision considerations: Wire localization At the request of the patient, I spoke with her daughter, Blythe Stanford, who is patient's medical power-of-attorney by telephone on 03/28/2018 at 11:40 a.m. Electronically Signed   By: Fidela Salisbury M.D.   On: 03/28/2018 11:46   Result Date: 03/28/2018 CLINICAL DATA:  84 year old female for tissue sampling of LEFT breast mass. EXAM: ULTRASOUND  GUIDED LEFT BREAST CORE NEEDLE BIOPSY COMPARISON:  Previous exam(s). FINDINGS: Phone consent was obtained from the patient's daughter, Blythe Stanford, on 03/24/2018, as the patient has dementia. We discussed the high likelihood of a successful procedure. We discussed the risks of the procedure, including infection, bleeding, tissue injury, clip migration, and inadequate sampling. The usual time-out protocol was performed immediately prior to the procedure. Using sterile technique and 1% Lidocaine as local anesthetic, under direct ultrasound visualization, a 14 gauge spring-loaded device was used to perform biopsy of the 5.1 cm hypoechoic mass at the 2 o'clock position of the LEFT breast using a MEDIAL approach. At the conclusion of the procedure a RIBBON shaped tissue marker clip was deployed into the biopsy cavity. Follow up 2 view mammogram was performed and dictated separately. IMPRESSION: Ultrasound guided biopsy of UPPER-OUTER LEFT breast mass. No apparent complications. Electronically Signed: By: Margarette Canada M.D. On: 03/24/2018 14:20    Assessment and plan- Patient is a 84 y.o. female with newly diagnosed invasive mammary carcinoma of the left breast clinical prognostic stage is pending based on HER-2 results.  Anatomical stage IIB T3 N0 M0 ER positive greater than 90%, PR 1% positive and HER-2/neu equivocal by IHC  I discussed the results of the mammogram and pathology with the patients daughter in detail who is her healthcare proxy.  We are still waiting on HER-2 FISH testing results   If HER-2 FISH is negative then she has ER PR positive and HER-2/neu negative tumor- despite the large size of the mass given her underlying dementia and age I would not recommend any chemotherapy neo-adjuvantly or adjuvantly.  She is also not a candidate for any adjuvant radiation therapy.  In this scenario I would recommend that patient should proceed with an upfront lumpectomy or mastectomy based on surgical  opinion and sentinel lymph node biopsy. I will also touch base with Dr. Dahlia Byes after she sees him next week.  If the plan is to's proceed with surgery relatively soon in the next 2-1/2 to 3 weeks-I do not see much benefit with neoadjuvant endocrine therapy in that setting.  If Dr. Dahlia Byes would like me to attempt any treatment for better surgical outcomes if she is HER-2 negative I will consider neoadjuvant endocrine therapy for her,   I will see her after surgery and discuss hormone therapy with patient's daughter at that time.  I will schedule a bone density scan over the next few weeks which would help [place] decide about which hormone therapy to choose  If HER-2 FISH is positive-patient's daughter does not desire any chemotherapy neo-adjuvantly and I will consider using Herceptin and/or Perjeta neo-adjuvantly to see if we can shrink her breast mass to allow better surgical outcomes.  She will also need her to base treatment adjuvantly for a year.  Patient's daughter is open to considering Herceptin treatment for her.  If her HER-2 FISH is positive I will see her back in 1 week's time.  Otherwise I  will see the patient back roughly in 2-1/2 to 3 weeks time after her surgery to discuss adjuvant treatment options.  Treatment will be given with a curative intent.      Total face to face encounter time for this patient visit was 45 min. >50% of the time was  spent in counseling and coordination of care.   Thank you for this kind referral and the opportunity to participate in the care of this patient   Visit Diagnosis 1. Malignant neoplasm of left breast in female, estrogen receptor negative, unspecified site of breast ([place])   2. Post-menopausal   3. Goals of care, counseling/discussion     Dr. Randa Evens, MD, MPH [facility] [phone] 04/01/2018  12:40 PM

## 2018-04-01 NOTE — Progress Notes (Signed)
  Oncology Nurse Navigator Documentation  Navigator Location: [facility] (04/01/18 1200) Referral date to RadOnc/MedOnc: 04/01/18 (04/01/18 1200) )Navigator Encounter Type: Clinic/MDC (04/01/18 1200)                       Treatment Phase: Pre-Tx/Tx Discussion (04/01/18 1200) Barriers/Navigation Needs: Education (04/01/18 1200) Education: Newly Diagnosed Cancer Education (04/01/18 1200)                        Time Spent with Patient: 60 (04/01/18 1200)   Met patient, her daughter and her caregiver Verdie Drown today during her initial medical oncology consult with Dr. Janese Banks.  Dr. Janese Banks discussed treatment options.  She discussed pending Her2 results she would recommend surgery and antihormonal only.  If Her2 positive she would recommend Herceptin only.  She is scheduled to return to see Dr. Hedwig Morton post surgery. Patient has dementia and lives in a group home.  Daughter is here from [place] and has POA.  Gave daughter breast cancer educational literature, "My Breast Cancer Treatment Handbook" by Josephine Igo, RN.  They are to call if they have any questions or needs.

## 2018-04-04 ENCOUNTER — Encounter: Payer: Self-pay | Admitting: Surgery

## 2018-04-04 ENCOUNTER — Ambulatory Visit (INDEPENDENT_AMBULATORY_CARE_PROVIDER_SITE_OTHER): Payer: Medicare Other | Admitting: Surgery

## 2018-04-04 ENCOUNTER — Other Ambulatory Visit: Payer: Self-pay | Admitting: *Deleted

## 2018-04-04 DIAGNOSIS — C50912 Malignant neoplasm of unspecified site of left female breast: Secondary | ICD-10-CM

## 2018-04-04 NOTE — Progress Notes (Signed)
Patient ID: Melissa Combs, female   DOB: December 29, 1929, 84 y.o.   MRN: [id number]  HPI Melissa Combs is a 84 y.o. female seen in consultation at the request of Dr Orpha Bur for surgical management of mammary carcinoma.  She has multiple comorbidities including dementia hypertension chronic kidney disease and a history of malignant hyperthermia.  She lives in a nursing home on comes with the daughter and the caregiver.  The she is in a wheelchair and apparently she is able to walk for only a few steps with a walker. Apparently the car was the one that noticed a mass on the left breast the plantar mammogram showing a 5 cm mass on the left breast.  Please note that I personally review the mammogram.  There is no evidence of lymphadenopathy this prompted a biopsy showing evidence of mammary carcinoma with ER PR positive and HER-2 is equivocal. She is unable to make her own decisions. Patient currently lives in a nursing home and apparently there is some reports of intermittent urinary incontinence  HPI  Past Medical History:  Diagnosis Date  . Allergy   . Anemia   . Arthritis   . B12 deficiency 11/06/2015   Less than 400 October 2016  . Chronic constipation   . Dementia   . Dementia   . Family history of adverse reaction to anesthesia    brother, daughter and grandson had malignant hyperthermia in the past  . Glaucoma   . History of fibrocystic disease of breast    biopsy done, negative  . History of hand fracture    left  . History of shingles 2014  . History of wrist fracture    left  . Hypertension   . Impacted cerumen   . Malignant hyperthermia   . Pneumonia   . Postherpetic neuralgia   . TIA (transient ischemic attack) Feb 2016    Past Surgical History:  Procedure Laterality Date  . BREAST BIOPSY Left 03/24/2018   path pending  . BREAST LUMPECTOMY     unsure which side  . CATARACT EXTRACTION W/PHACO Right 01/12/2017   Procedure: CATARACT EXTRACTION PHACO AND INTRAOCULAR LENS  PLACEMENT (IOC);  Surgeon: Birder Robson, MD;  Location: [facility];  Service: Ophthalmology;  Laterality: Right;  [place] 1:05.8 AP% 26.0 CDE 17.07 Fluid pack lot # [id number]  . ESOPHAGOGASTRODUODENOSCOPY (EGD) WITH PROPOFOL N/A 02/10/2018   Procedure: ESOPHAGOGASTRODUODENOSCOPY (EGD) WITH PROPOFOL;  Surgeon: Lin Landsman, MD;  Location: [place];  Service: Gastroenterology;  Laterality: N/A;    Family History  Family history unknown: Yes    Social History Social History   Tobacco Use  . Smoking status: Never Smoker  . Smokeless tobacco: Never Used  Substance Use Topics  . Alcohol use: No    Alcohol/week: 0.0 oz  . Drug use: No    No Known Allergies  Current Outpatient Medications  Medication Sig Dispense Refill  . acetaminophen (TYLENOL 8 HOUR ARTHRITIS PAIN) 650 MG CR tablet Take 650 mg by mouth 3 (three) times daily as needed.     Marland Kitchen amLODipine (NORVASC) 5 MG tablet Take 1 tablet (5 mg total) by mouth daily. 90 tablet 3  . aspirin EC 81 MG tablet Take 1 tablet (81 mg total) by mouth daily. 30 tablet 11  . Bismuth Subsalicylate 630 ZS/01UX SUSP Take 30 mLs (1,050 mg total) by mouth 2 (two) times daily as needed.    . brimonidine-timolol (COMBIGAN) 0.2-0.5 % ophthalmic solution Place 1 drop into both eyes 2 (two) times  daily.    . carvedilol (COREG) 6.25 MG tablet TAKE ONE TABLET BY MOUTH TWO TIMES A DAY 60 tablet 11  . citalopram (CELEXA) 10 MG tablet Take 1 tablet (10 mg total) by mouth daily. 30 tablet 11  . Diapers & Supplies (HUGGIES PULL-UPS) MISC 1 each by Does not apply route 3 (three) times daily. Adult size medium, urinary and stool incontinence, LON 99 months 100 each 5  . diclofenac sodium (VOLTAREN) 1 % GEL Apply 2 grams to each knee three times a day while awake, for example, 8 am, 2 pm, and 8 pm 100 g 5  . divalproex (DEPAKOTE SPRINKLES) 125 MG capsule Take 125 mg by mouth 3 (three) times daily.     . divalproex (DEPAKOTE) 125 MG DR tablet Take 125 mg by  mouth daily as needed. May take additional 4th tablet as needed to go along with 3 times daily order.    . donepezil (ARICEPT) 10 MG tablet Take 1 tablet (10 mg total) by mouth at bedtime. 30 tablet 11  . doxycycline (VIBRA-TABS) 100 MG tablet Take 1 tablet (100 mg total) by mouth 2 (two) times daily. 14 tablet 0  . Elastic Bandages & Supports (MEDICAL COMPRESSION STOCKINGS) MISC Put stockings on every morning and remove every night; do not sleep in stockings; get the kind with open toes 2 each 0  . fluticasone (FLONASE) 50 MCG/ACT nasal spray USE 2 SPRAYS IN BOTH NOSTRILS ONCE DAILYFOR RHINITIS 16 g 11  . latanoprost (XALATAN) 0.005 % ophthalmic solution Place 1 drop into both eyes at bedtime.    Marland Kitchen lisinopril (PRINIVIL,ZESTRIL) 20 MG tablet Take 1 tablet (20 mg total) by mouth daily. 30 tablet 3  . meloxicam (MOBIC) 7.5 MG tablet Take 1 tablet (7.5 mg total) by mouth daily. For 30 days only 30 tablet 0  . montelukast (SINGULAIR) 5 MG chewable tablet CHEW 1 TABLET BY MOUTH AT BEDTIME 30 tablet 11  . NAMENDA XR 28 MG CP24 24 hr capsule TAKE 1 CAPSULE BY MOUTH ONCE DAILY FOR MEMORY 30 capsule 11  . omeprazole (PRILOSEC) 20 MG capsule Take 1 capsule (20 mg total) by mouth daily. 30 capsule 3  . polyethylene glycol powder (GLYCOLAX/MIRALAX) powder MIX 17 GR (1 CAPFUL) IN 8 OZ WATER/JUICEAND DRINK ONCE DAILY AS NEEDED TO FOR CONSTIPATION 527 g 11  . pravastatin (PRAVACHOL) 20 MG tablet Take 1 tablet (20 mg total) by mouth at bedtime. 30 tablet 3  . pregabalin (LYRICA) 25 MG capsule 25 mg qhs every other night 30 capsule 2  . vitamin B-12 (CYANOCOBALAMIN) 500 MCG tablet TAKE ONE TABLET BY MOUTH EVERY DAY 28 tablet 12   No current facility-administered medications for this visit.      Review of Systems Full ROS  was asked and was negative except for the information on the HPI  Physical Exam There were no vitals taken for this visit. CONSTITUTIONAL:  Debilitated comes in a wheelchair with daughter  and care giver. EYES: Pupils are equal, round, and reactive to light, Sclera are non-icteric. EARS, NOSE, MOUTH AND THROAT: The oropharynx is clear. The oral mucosa is pink and moist. Hearing is intact to voice. LYMPH NODES:  Lymph nodes in the neck are normal. RESPIRATORY:  Lungs are clear. There is normal respiratory effort, with equal breath sounds bilaterally, and without pathologic use of accessory muscles. CARDIOVASCULAR: Heart is regular without murmurs, gallops, or rubs. BREAST: Large hard mobile mass left breast 6 cms in size some nipple retraction. No  Axillary LAD GI: The abdomen is soft, nontender, and nondistended. There are no palpable masses. There is no hepatosplenomegaly. There are normal bowel sounds in all quadrants. GU: Rectal deferred.   MUSCULOSKELETAL: Normal muscle strength and tone. No cyanosis or edema.   SKIN: Turgor is good and there are no pathologic skin lesions or ulcers. NEUROLOGIC: Motor and sensation is grossly normal.  She is awake and alert but confused, no focal deficits.  Follows simple commands. PSYCH: She is confused but pleasant.  She is oriented to person only, she is not competent due to her dementia  Data Reviewed  I have personally reviewed the patient's imaging, laboratory findings and medical records.    Assessment/Plan   84 year old debilitated and demented female with recently diagnosed mammary carcinoma of her left breast.  I had a lengthy discussion with the patient and with her daughter who is the power of attorney. I explained to her the implications of the potential mastectomy with her being under general anesthetic for at least 2 hours or so.  She does have a significant family  history of malignant hyperthermia increasing her surgical risk even more.  I explained to her about the chances of complications related to her malignant hyperthermia, respiratory failure, resuscitation and hemodynamic support. At this time I do not think that she is  the best operative candidate and I have discussed this with her daughter in detail.  She is also in agreement with me that her mother is very debilitated and probably would not tolerate general anesthetic for a long time and does not want to deal with the potential complication of malignant hyperthermia.   Daughter is also adamant about good quality of life and avoiding of pain.  Certainly a mastectomy will come with some pain as well as a drain placement that will complicate her  postoperative care .  With that in mind d I have recommended to be evaluated again by Dr. Janese Banks for potential p.o. Chemotherapy, hormonal rx.  They do understand that she is 13 and with a cancer diagnosis and they have very realistic  expectations. I will be happy to facilitate any further decision making process. We will see her on a prn basis Copy of this report will be sent to the referring provider  Caroleen Hamman, MD FACS General Surgeon 04/04/2018, 9:03 PM

## 2018-04-04 NOTE — Patient Instructions (Signed)
Please call our office if you have questions or concerns.   

## 2018-04-08 ENCOUNTER — Telehealth: Payer: Self-pay | Admitting: *Deleted

## 2018-04-08 ENCOUNTER — Telehealth: Payer: Self-pay

## 2018-04-08 ENCOUNTER — Encounter: Payer: Self-pay | Admitting: Oncology

## 2018-04-08 ENCOUNTER — Other Ambulatory Visit: Payer: Self-pay

## 2018-04-08 DIAGNOSIS — R350 Frequency of micturition: Secondary | ICD-10-CM

## 2018-04-08 MED ORDER — DOXYCYCLINE HYCLATE 100 MG PO TABS: 100.0000 mg | ORAL_TABLET | Freq: Two times a day (BID) | ORAL | 0 refills | Status: DC

## 2018-04-08 NOTE — Telephone Encounter (Signed)
Returned call to the daughter.  She facility called her to let her know that pt needs urgent appt and she just wanted to get more info because she comes to appts with her mother and did not understand what was urgent. I told her that Dr. Janese Banks got the message that pt was not having surgery so since she is not we will start her on hormone treatment and Dr. Janese Banks will need to discuss the med and side effects and then prescribe the med. The med is the one that will help to prevent causing another breast cancer. She will make arrangements to be there next week

## 2018-04-08 NOTE — Telephone Encounter (Signed)
Urinary frequency

## 2018-04-08 NOTE — Telephone Encounter (Signed)
notified

## 2018-04-08 NOTE — Telephone Encounter (Signed)
Patient's daughter called and left a voice mail message for Dr. Janese Banks to call her back. She received word from the assisted living facility that Dr. Janese Banks wanted her mother to come in for an appointment on 8/15, and that it was urgent. She would like a call back at [phone].     dhs

## 2018-04-08 NOTE — Telephone Encounter (Signed)
Copied from [place] [id number]. Topic: Inquiry >> Apr 08, 2018  2:48 PM Pricilla Handler wrote: Reason for CRM: Joy with [facility] (PHN # [id number] and FAX # [phone]) called requesting an order for patient to be tested for a possible UTI. Joy states that they would collect the speciment and supply it to our office.      Thank You!!!

## 2018-04-08 NOTE — Telephone Encounter (Signed)
We will need a symptom or diagnosis or reason She can take the specimen to the HOSPITAL at this time since it is nearly 4 pm on a Friday Please ORDER urinalysis with dip and culture Use whatever her reason is (burning, odor, frequency, etc.) for the diagnosis I'll send in antibiotics to start empirically If she gets worse, has confusion or delirium or fever over the weekend, take her to the ER

## 2018-04-09 ENCOUNTER — Other Ambulatory Visit
Admission: RE | Admit: 2018-04-09 | Discharge: 2018-04-09 | Disposition: A | Discharge status: Home / Self Care | Payer: Medicare Other | Source: Ambulatory Visit | Attending: Family Medicine | Admitting: Family Medicine

## 2018-04-09 DIAGNOSIS — R350 Frequency of micturition: Secondary | ICD-10-CM | POA: Insufficient documentation

## 2018-04-09 LAB — URINALYSIS, ROUTINE W REFLEX MICROSCOPIC
Bacteria, UA: NONE SEEN
Bilirubin Urine: NEGATIVE
Glucose, UA: NEGATIVE mg/dL
KETONES UR: NEGATIVE mg/dL
LEUKOCYTES UA: NEGATIVE
Nitrite: NEGATIVE
PH: 8 (ref 5.0–8.0)
PROTEIN: NEGATIVE mg/dL
Specific Gravity, Urine: 1.004 — ABNORMAL LOW (ref 1.005–1.030)

## 2018-04-10 LAB — URINE CULTURE

## 2018-04-11 LAB — SURGICAL PATHOLOGY

## 2018-04-14 ENCOUNTER — Inpatient Hospital Stay (HOSPITAL_BASED_OUTPATIENT_CLINIC_OR_DEPARTMENT_OTHER): Payer: Medicare Other | Admitting: Oncology

## 2018-04-14 ENCOUNTER — Telehealth: Payer: Self-pay | Admitting: Family Medicine

## 2018-04-14 ENCOUNTER — Encounter: Payer: Self-pay | Admitting: Oncology

## 2018-04-14 VITALS — BP 129/72 | HR 53 | Temp 97.9°F | Resp 18 | Ht 65.0 in | Wt 129.4 lb

## 2018-04-14 DIAGNOSIS — Z79811 Long term (current) use of aromatase inhibitors: Secondary | ICD-10-CM

## 2018-04-14 DIAGNOSIS — R54 Age-related physical debility: Secondary | ICD-10-CM | POA: Diagnosis not present

## 2018-04-14 DIAGNOSIS — Z170 Estrogen receptor positive status [ER+]: Secondary | ICD-10-CM | POA: Diagnosis not present

## 2018-04-14 DIAGNOSIS — C50412 Malignant neoplasm of upper-outer quadrant of left female breast: Secondary | ICD-10-CM

## 2018-04-14 DIAGNOSIS — F0390 Unspecified dementia without behavioral disturbance: Secondary | ICD-10-CM

## 2018-04-14 DIAGNOSIS — Z7189 Other specified counseling: Secondary | ICD-10-CM

## 2018-04-14 MED ORDER — CALCIUM-VITAMIN D 600-400 MG-UNIT PO TABS: 1.0000 | ORAL_TABLET | Freq: Two times a day (BID) | ORAL | 3 refills | Status: DC

## 2018-04-14 MED ORDER — ANASTROZOLE 1 MG PO TABS
1.0000 mg | ORAL_TABLET | Freq: Every day | ORAL | 3 refills | Status: AC
Start: 2018-04-14 — End: ?

## 2018-04-14 NOTE — Telephone Encounter (Signed)
Please review results.

## 2018-04-14 NOTE — Telephone Encounter (Signed)
The labs never came to me We'll ask someone to investigate Please let them know that the urine suggested multiple species, not a good collection If she is still having symptoms, recommend an appointment (with NP Benjamine Mola is fine)

## 2018-04-14 NOTE — Telephone Encounter (Signed)
Copied from [place] [id number]. Topic: General - Other >> Apr 14, 2018  2:41 PM Yvette Rack wrote: Reason for CRM: Joy with [facility] (PHN # [id number] and FAX # [phone]) calling for urine test results

## 2018-04-15 ENCOUNTER — Telehealth: Payer: Self-pay | Admitting: Family Medicine

## 2018-04-15 DIAGNOSIS — C50412 Malignant neoplasm of upper-outer quadrant of left female breast: Secondary | ICD-10-CM | POA: Insufficient documentation

## 2018-04-15 DIAGNOSIS — Z7189 Other specified counseling: Secondary | ICD-10-CM | POA: Insufficient documentation

## 2018-04-15 DIAGNOSIS — Z170 Estrogen receptor positive status [ER+]: Principal | ICD-10-CM

## 2018-04-15 NOTE — Telephone Encounter (Signed)
Patient is currently still taking Doxycycline. Nurse stated that once she has completed course she will call back and update PCP on how patient if doing and if she is still having symptoms.

## 2018-04-15 NOTE — Progress Notes (Signed)
Hematology/Oncology Consult note [facility]  Telephone:([phone] [phone]  Patient Care Team: [name], MD as PCP - General (Family Medicine) Mohammed Kindle, MD as Attending Physician (Pain Medicine) Anabel Bene, MD as Referring Physician (Neurology)   Name of the patient: Melissa Combs  [id number]  25-Feb-1930   Date of visit: 04/15/18  Diagnosis-invasive mammary carcinoma of the left breast clinical prognostic stage IIA T3 N0 M0 ER 90% positive PR 1% positive HER-2/neu negative  Chief complaint/ Reason for visit-discuss management options for breast cancer  Heme/Onc history: patient is a 84 year old female with a past medical history significant for dementia, hypertension, hyperlipidemia and chronic kidney disease among other medical problems.  She has been a resident of group home since 2015 due to her gradually worsening dementia.  Her only close family currently is her daughter who lives in [place] and is with her today along with her caregiver from group home.  With regards to her dementia patient is able to ambulate with the help of a walker.  She can carry out conversations and is not incontinent of her urine and stool.  She is able to eat without any aspiration issues and overall she has been doing well at the nursing home without any recent hospitalizations.  Her caregiver noticed a left breast mass recently which prompted a mammogram.  Mammogram showed a 5.1 cm mass in the 2 o'clock position of the left breast.  No evidence of axillary adenopathy.  No evidence of breast masses in the right breast.  Patient has had a prior breast biopsy and surgery in the 1970s which the daughter says was not malignancy.  No other family history of breast cancer, ovarian cancer, colon or prostate cancer.  There is a family history of malignant hyperthermia with nitrous oxide.  Patient is currently pleasantly confused.  She is not aware  about her underlying medical condition and history is obtained as above with the help of her daughter and caregiver.  Overall her appetite is good and she has not had any unintentional weight loss.  She does not complain of any pain today  Patient underwent core biopsy which showed invasive mammary carcinoma, grade 2, ER greater than 90% positive, PR 1% positive and HER-2 equivocal by IHC.  FISH negative  Patient was seen by Dr. Adora Fridge from surgery and given family history of malignant hyperthermia along with patient age and dementia she was not deemed to be a surgical candidate.  Interval history-patient is here with her caregiver and daughter today.  She is confused and does not verbalize much  ECOG PS- 2-3 Pain scale- 0   Review of systems- Review of Systems  Unable to perform ROS: Dementia      No Known Allergies   Past Medical History:  Diagnosis Date  . Allergy   . Anemia   . Arthritis   . B12 deficiency 11/06/2015   Less than 400 October 2016  . Chronic constipation   . Dementia   . Dementia   . Family history of adverse reaction to anesthesia    brother, daughter and grandson had malignant hyperthermia in the past  . Glaucoma   . History of fibrocystic disease of breast    biopsy done, negative  . History of hand fracture    left  . History of shingles 2014  . History of wrist fracture    left  . Hypertension   . Impacted cerumen   . Malignant  hyperthermia   . Pneumonia   . Postherpetic neuralgia   . TIA (transient ischemic attack) Feb 2016     Past Surgical History:  Procedure Laterality Date  . BREAST BIOPSY Left 03/24/2018   path pending  . BREAST LUMPECTOMY     unsure which side  . CATARACT EXTRACTION W/PHACO Right 01/12/2017   Procedure: CATARACT EXTRACTION PHACO AND INTRAOCULAR LENS PLACEMENT (IOC);  Surgeon: Birder Robson, MD;  Location: [facility];  Service: Ophthalmology;  Laterality: Right;  [place] 1:05.8 AP% 26.0 CDE 17.07 Fluid pack lot #  [id number]  . ESOPHAGOGASTRODUODENOSCOPY (EGD) WITH PROPOFOL N/A 02/10/2018   Procedure: ESOPHAGOGASTRODUODENOSCOPY (EGD) WITH PROPOFOL;  Surgeon: Lin Landsman, MD;  Location: [facility];  Service: Gastroenterology;  Laterality: N/A;    Social History   Socioeconomic History  . Marital status: Single    Spouse name: Not on file  . Number of children: Not on file  . Years of education: Not on file  . Highest education level: Not on file  Occupational History  . Not on file  Social Needs  . Financial resource strain: Not on file  . Food insecurity:    Worry: Not on file    Inability: Not on file  . Transportation needs:    Medical: Not on file    Non-medical: Not on file  Tobacco Use  . Smoking status: Never Smoker  . Smokeless tobacco: Never Used  Substance and Sexual Activity  . Alcohol use: No    Alcohol/week: 0.0 standard drinks  . Drug use: No  . Sexual activity: Never  Lifestyle  . Physical activity:    Days per week: Not on file    Minutes per session: Not on file  . Stress: Not on file  Relationships  . Social connections:    Talks on phone: Not on file    Gets together: Not on file    Attends religious service: Not on file    Active member of club or organization: Not on file    Attends meetings of clubs or organizations: Not on file    Relationship status: Not on file  . Intimate partner violence:    Fear of current or ex partner: Not on file    Emotionally abused: Not on file    Physically abused: Not on file    Forced sexual activity: Not on file  Other Topics Concern  . Not on file  Social History Narrative  . Not on file    Family History  Family history unknown: Yes     Current Outpatient Medications:  .  amLODipine (NORVASC) 5 MG tablet, Take 1 tablet (5 mg total) by mouth daily., Disp: 90 tablet, Rfl: 3 .  aspirin EC 81 MG tablet, Take 1 tablet (81 mg total) by mouth daily., Disp: 30 tablet, Rfl: 11 .  brimonidine-timolol  (COMBIGAN) 0.2-0.5 % ophthalmic solution, Place 1 drop into both eyes 2 (two) times daily., Disp: , Rfl:  .  carvedilol (COREG) 6.25 MG tablet, TAKE ONE TABLET BY MOUTH TWO TIMES A DAY, Disp: 60 tablet, Rfl: 11 .  citalopram (CELEXA) 10 MG tablet, Take 1 tablet (10 mg total) by mouth daily., Disp: 30 tablet, Rfl: 11 .  Diapers & Supplies (HUGGIES PULL-UPS) MISC, 1 each by Does not apply route 3 (three) times daily. Adult size medium, urinary and stool incontinence, LON 99 months, Disp: 100 each, Rfl: 5 .  divalproex (DEPAKOTE SPRINKLES) 125 MG capsule, Take 125 mg by mouth 3 (three) times  daily. , Disp: , Rfl:  .  divalproex (DEPAKOTE) 125 MG DR tablet, Take 125 mg by mouth daily as needed. May take additional 4th tablet as needed to go along with 3 times daily order., Disp: , Rfl:  .  donepezil (ARICEPT) 10 MG tablet, Take 1 tablet (10 mg total) by mouth at bedtime., Disp: 30 tablet, Rfl: 11 .  doxycycline (VIBRA-TABS) 100 MG tablet, Take 1 tablet (100 mg total) by mouth 2 (two) times daily., Disp: 14 tablet, Rfl: 0 .  Elastic Bandages & Supports (MEDICAL COMPRESSION STOCKINGS) MISC, Put stockings on every morning and remove every night; do not sleep in stockings; get the kind with open toes, Disp: 2 each, Rfl: 0 .  lisinopril (PRINIVIL,ZESTRIL) 20 MG tablet, Take 1 tablet (20 mg total) by mouth daily., Disp: 30 tablet, Rfl: 3 .  meloxicam (MOBIC) 7.5 MG tablet, Take 1 tablet (7.5 mg total) by mouth daily. For 30 days only, Disp: 30 tablet, Rfl: 0 .  montelukast (SINGULAIR) 5 MG chewable tablet, CHEW 1 TABLET BY MOUTH AT BEDTIME, Disp: 30 tablet, Rfl: 11 .  NAMENDA XR 28 MG CP24 24 hr capsule, TAKE 1 CAPSULE BY MOUTH ONCE DAILY FOR MEMORY, Disp: 30 capsule, Rfl: 11 .  omeprazole (PRILOSEC) 20 MG capsule, Take 1 capsule (20 mg total) by mouth daily., Disp: 30 capsule, Rfl: 3 .  pravastatin (PRAVACHOL) 20 MG tablet, Take 1 tablet (20 mg total) by mouth at bedtime., Disp: 30 tablet, Rfl: 3 .  pregabalin  (LYRICA) 25 MG capsule, 25 mg qhs every other night, Disp: 30 capsule, Rfl: 2 .  vitamin B-12 (CYANOCOBALAMIN) 500 MCG tablet, TAKE ONE TABLET BY MOUTH EVERY DAY, Disp: 28 tablet, Rfl: 12 .  acetaminophen (TYLENOL 8 HOUR ARTHRITIS PAIN) 650 MG CR tablet, Take 650 mg by mouth 3 (three) times daily as needed. , Disp: , Rfl:  .  anastrozole (ARIMIDEX) 1 MG tablet, Take 1 tablet (1 mg total) by mouth daily., Disp: 30 tablet, Rfl: 3 .  Bismuth Subsalicylate 143 OO/87NZ SUSP, Take 30 mLs (1,050 mg total) by mouth 2 (two) times daily as needed., Disp: , Rfl:  .  Calcium Carb-Cholecalciferol (CALCIUM-VITAMIN D) 600-400 MG-UNIT TABS, Take 1 tablet by mouth 2 (two) times daily., Disp: 60 tablet, Rfl: 3 .  diclofenac sodium (VOLTAREN) 1 % GEL, Apply 2 grams to each knee three times a day while awake, for example, 8 am, 2 pm, and 8 pm (Patient not taking: Reported on 04/14/2018), Disp: 100 g, Rfl: 5 .  fluticasone (FLONASE) 50 MCG/ACT nasal spray, USE 2 SPRAYS IN BOTH NOSTRILS ONCE DAILYFOR RHINITIS (Patient not taking: Reported on 04/14/2018), Disp: 16 g, Rfl: 11 .  latanoprost (XALATAN) 0.005 % ophthalmic solution, Place 1 drop into both eyes at bedtime., Disp: , Rfl:  .  polyethylene glycol powder (GLYCOLAX/MIRALAX) powder, MIX 17 GR (1 CAPFUL) IN 8 OZ WATER/JUICEAND DRINK ONCE DAILY AS NEEDED TO FOR CONSTIPATION (Patient not taking: Reported on 04/14/2018), Disp: 527 g, Rfl: 11  Physical exam:  Vitals:   04/14/18 0938  BP: 129/72  Pulse: (!) 53  Resp: 18  Temp: 97.9 F (36.6 C)  TempSrc: Tympanic  Weight: 129 lb 6.4 oz (58.7 kg)  Height: 5' 5"  (1.651 m)   Physical Exam  Constitutional:  Frail elderly lady sitting in a wheelchair appears in no acute distress.  HENT:  Head: Normocephalic and atraumatic.  Eyes: Pupils are equal, round, and reactive to light. EOM are normal.  Neck: Normal range of  motion.  Cardiovascular: Normal rate, regular rhythm and normal heart sounds.  Pulmonary/Chest: Effort  normal and breath sounds normal.  Abdominal: Soft. Bowel sounds are normal.  Neurological:  She is oriented to self.  Skin: Skin is warm and dry.     CMP Latest Ref Rng & Units 03/12/2018  Glucose 70 - 99 mg/dL 92  BUN 8 - 23 mg/dL 17  Creatinine 0.44 - 1.00 mg/dL 0.60  Sodium 135 - 145 mmol/L 140  Potassium 3.5 - 5.1 mmol/L 3.8  Chloride 98 - 111 mmol/L 98  CO2 22 - 32 mmol/L 34(H)  Calcium 8.9 - 10.3 mg/dL 8.9  Total Protein 6.5 - 8.1 g/dL 7.3  Total Bilirubin 0.3 - 1.2 mg/dL 0.5  Alkaline Phos 38 - 126 U/L 42  AST 15 - 41 U/L 17  ALT 0 - 44 U/L 7   CBC Latest Ref Rng & Units 03/12/2018  WBC 3.6 - 11.0 K/uL 10.2  Hemoglobin 12.0 - 16.0 g/dL 10.9(L)  Hematocrit 35.0 - 47.0 % 32.8(L)  Platelets 150 - 440 K/uL 226    No images are attached to the encounter.  Mm Clip Placement Left  Result Date: 03/24/2018 CLINICAL DATA:  Evaluate clip placement following ultrasound-guided LEFT breast biopsy. EXAM: DIAGNOSTIC LEFT MAMMOGRAM POST ULTRASOUND BIOPSY COMPARISON:  Previous exam(s). FINDINGS: Mammographic images were obtained following ultrasound guided biopsy of the 5.1 cm mass within the UPPER-OUTER LEFT breast. The RIBBON shaped clip is in satisfactory position. IMPRESSION: Satisfactory RIBBON clip position following ultrasound-guided LEFT breast biopsy. Final Assessment: Post Procedure Mammograms for Marker Placement Electronically Signed   By: Margarette Canada M.D.   On: 03/24/2018 14:08   [place] Lt Breast Bx W Loc Dev 1st Lesion Img Bx Spec US Guide  Addendum Date: 03/28/2018   ADDENDUM REPORT: 03/28/2018 11:46 ADDENDUM: PATHOLOGY ADDENDUM: Pathology: Invasive mammary carcinoma, grade 2. Pathology concordant with imaging findings: Yes Recommendation: Surgical referral. Localization/excision considerations: Wire localization At the request of the patient, I spoke with her daughter, Blythe Stanford, who is patient's medical power-of-attorney by telephone on 03/28/2018 at 11:40 a.m.  Electronically Signed   By: Fidela Salisbury M.D.   On: 03/28/2018 11:46   Result Date: 03/28/2018 CLINICAL DATA:  84 year old female for tissue sampling of LEFT breast mass. EXAM: ULTRASOUND GUIDED LEFT BREAST CORE NEEDLE BIOPSY COMPARISON:  Previous exam(s). FINDINGS: Phone consent was obtained from the patient's daughter, Blythe Stanford, on 03/24/2018, as the patient has dementia. We discussed the high likelihood of a successful procedure. We discussed the risks of the procedure, including infection, bleeding, tissue injury, clip migration, and inadequate sampling. The usual time-out protocol was performed immediately prior to the procedure. Using sterile technique and 1% Lidocaine as local anesthetic, under direct ultrasound visualization, a 14 gauge spring-loaded device was used to perform biopsy of the 5.1 cm hypoechoic mass at the 2 o'clock position of the LEFT breast using a MEDIAL approach. At the conclusion of the procedure a RIBBON shaped tissue marker clip was deployed into the biopsy cavity. Follow up 2 view mammogram was performed and dictated separately. IMPRESSION: Ultrasound guided biopsy of UPPER-OUTER LEFT breast mass. No apparent complications. Electronically Signed: By: Margarette Canada M.D. On: 03/24/2018 14:20     Assessment and plan- Patient is a 84 y.o. female with newly diagnosed invasive mammary carcinoma of the left breast clinically prognostic stage II aT3 N0 M0 ER 90% positive, PR 1% positive and HER-2/neu negative  Patient is seen Dr. Adora Fridge and has not deemed to be a  candidate for surgery.  Given her overall frailty age and dementia she is not a candidate for chemotherapy either.  She can also not sit still for any radiation treatment.  Given that her tumor is strongly ER positive I would recommend putting her on hormone therapy at this time.  Both tamoxifen and AI are reasonable options for her as they both have a different but also an overlapping side effect profile.   Tamoxifen can increase the risk of DVT which may be an important consideration for her given her limited mobility.  AI can cause worsening bone health, arthralgias.  Mood swings and hot flashes can be seen with both the drugs.  Patient's daughter understands and would prefer to choose an AI over tamoxifen given the risk of DVT which I think is reasonable.  I will go ahead and prescribe her Arimidex 1 mg daily at this time.  We will also obtain a baseline bone density scan.  She needs to take calcium 1200 mg along with vitamin D 800 international units along with Arimidex.  I will tentatively see her in 6 weeks time to see how she is tolerating her Arimidex  Although patient does not have any known axillary involvement on ultrasound it is unclear if Arimidex will lead to complete resolution of her left breast mass.  As such Arimidex will be given with the palliative intent at this time  Cancer Staging Malignant neoplasm of upper-outer quadrant of left breast in female, estrogen receptor positive ([place]) Staging form: Breast, AJCC 8th Edition - Clinical stage from 04/14/2018: Stage IIA (cT3, cN0, cM0, G2, ER+, PR+, HER2-) - Signed by Sindy Guadeloupe, MD on 04/15/2018    Visit Diagnosis 1. Malignant neoplasm of upper-outer quadrant of left breast in female, estrogen receptor positive ([place])   2. Goals of care, counseling/discussion      Dr. Randa Evens, MD, MPH [facility] [phone] 04/15/2018 9:36 AM

## 2018-04-15 NOTE — Telephone Encounter (Signed)
Copied from [place] [phone]. Topic: Quick Communication - See Telephone Encounter >> Apr 15, 2018  4:02 PM Bea Graff, NT wrote: CRM for notification. See Telephone encounter for: 04/15/18. Daleen Snook with [facility] calling to request a discontinue order for doxycycline (VIBRA-TABS) 100 MG tablet. CB#: [phone]

## 2018-04-18 NOTE — Telephone Encounter (Signed)
Called and left message that it is ok to discontinue Doxycycline.

## 2018-04-18 NOTE — Telephone Encounter (Signed)
Yes, discontinue, thank you

## 2018-04-19 ENCOUNTER — Ambulatory Visit: Payer: Medicare Other | Admitting: Oncology

## 2018-04-19 ENCOUNTER — Other Ambulatory Visit: Payer: Medicare Other

## 2018-04-25 ENCOUNTER — Inpatient Hospital Stay: Admit: 2018-04-25 | Payer: Self-pay | Admitting: *Deleted

## 2018-04-25 DIAGNOSIS — G459 Transient cerebral ischemic attack, unspecified: Secondary | ICD-10-CM | POA: Diagnosis not present

## 2018-04-25 DIAGNOSIS — F0390 Unspecified dementia without behavioral disturbance: Secondary | ICD-10-CM | POA: Diagnosis not present

## 2018-04-26 ENCOUNTER — Ambulatory Visit
Admission: RE | Admit: 2018-04-26 | Discharge: 2018-04-26 | Disposition: A | Discharge status: Home / Self Care | Payer: Medicare Other | Source: Ambulatory Visit | Attending: Oncology | Admitting: Oncology

## 2018-04-26 DIAGNOSIS — Z171 Estrogen receptor negative status [ER-]: Secondary | ICD-10-CM | POA: Diagnosis not present

## 2018-04-26 DIAGNOSIS — Z780 Asymptomatic menopausal state: Secondary | ICD-10-CM | POA: Diagnosis not present

## 2018-04-26 DIAGNOSIS — C50912 Malignant neoplasm of unspecified site of left female breast: Secondary | ICD-10-CM | POA: Insufficient documentation

## 2018-04-26 DIAGNOSIS — M8589 Other specified disorders of bone density and structure, multiple sites: Secondary | ICD-10-CM | POA: Diagnosis not present

## 2018-04-28 ENCOUNTER — Other Ambulatory Visit: Payer: Medicare Other

## 2018-04-28 ENCOUNTER — Ambulatory Visit: Payer: Medicare Other | Admitting: Oncology

## 2018-05-09 ENCOUNTER — Telehealth: Payer: Self-pay | Admitting: Family Medicine

## 2018-05-09 NOTE — Telephone Encounter (Signed)
Copied from [place]. >> May 09, 2018 10:46 AM Sheran Luz wrote: Benjamine Mola, RN with [facility] stated that she has a referral from assisted living facility for home health nursing services for pain management. Benjamine Mola is requesting verbal orders to start services.   [phone]

## 2018-05-09 NOTE — Telephone Encounter (Signed)
That's fine

## 2018-05-10 NOTE — Telephone Encounter (Signed)
[facility] notified

## 2018-05-11 DIAGNOSIS — I10 Essential (primary) hypertension: Secondary | ICD-10-CM | POA: Diagnosis not present

## 2018-05-11 DIAGNOSIS — Z170 Estrogen receptor positive status [ER+]: Secondary | ICD-10-CM | POA: Diagnosis not present

## 2018-05-11 DIAGNOSIS — Z8673 Personal history of transient ischemic attack (TIA), and cerebral infarction without residual deficits: Secondary | ICD-10-CM | POA: Diagnosis not present

## 2018-05-11 DIAGNOSIS — C50912 Malignant neoplasm of unspecified site of left female breast: Secondary | ICD-10-CM | POA: Diagnosis not present

## 2018-05-11 DIAGNOSIS — F0390 Unspecified dementia without behavioral disturbance: Secondary | ICD-10-CM | POA: Diagnosis not present

## 2018-05-16 DIAGNOSIS — F0390 Unspecified dementia without behavioral disturbance: Secondary | ICD-10-CM | POA: Diagnosis not present

## 2018-05-16 DIAGNOSIS — Z170 Estrogen receptor positive status [ER+]: Secondary | ICD-10-CM | POA: Diagnosis not present

## 2018-05-16 DIAGNOSIS — I10 Essential (primary) hypertension: Secondary | ICD-10-CM | POA: Diagnosis not present

## 2018-05-16 DIAGNOSIS — C50912 Malignant neoplasm of unspecified site of left female breast: Secondary | ICD-10-CM | POA: Diagnosis not present

## 2018-05-16 DIAGNOSIS — Z8673 Personal history of transient ischemic attack (TIA), and cerebral infarction without residual deficits: Secondary | ICD-10-CM | POA: Diagnosis not present

## 2018-05-19 DIAGNOSIS — Z170 Estrogen receptor positive status [ER+]: Secondary | ICD-10-CM | POA: Diagnosis not present

## 2018-05-19 DIAGNOSIS — F0390 Unspecified dementia without behavioral disturbance: Secondary | ICD-10-CM | POA: Diagnosis not present

## 2018-05-19 DIAGNOSIS — I10 Essential (primary) hypertension: Secondary | ICD-10-CM | POA: Diagnosis not present

## 2018-05-19 DIAGNOSIS — Z8673 Personal history of transient ischemic attack (TIA), and cerebral infarction without residual deficits: Secondary | ICD-10-CM | POA: Diagnosis not present

## 2018-05-19 DIAGNOSIS — C50912 Malignant neoplasm of unspecified site of left female breast: Secondary | ICD-10-CM | POA: Diagnosis not present

## 2018-05-23 DIAGNOSIS — Z8673 Personal history of transient ischemic attack (TIA), and cerebral infarction without residual deficits: Secondary | ICD-10-CM | POA: Diagnosis not present

## 2018-05-23 DIAGNOSIS — C50912 Malignant neoplasm of unspecified site of left female breast: Secondary | ICD-10-CM | POA: Diagnosis not present

## 2018-05-23 DIAGNOSIS — F0390 Unspecified dementia without behavioral disturbance: Secondary | ICD-10-CM | POA: Diagnosis not present

## 2018-05-23 DIAGNOSIS — Z170 Estrogen receptor positive status [ER+]: Secondary | ICD-10-CM | POA: Diagnosis not present

## 2018-05-23 DIAGNOSIS — I10 Essential (primary) hypertension: Secondary | ICD-10-CM | POA: Diagnosis not present

## 2018-05-25 DIAGNOSIS — Z8673 Personal history of transient ischemic attack (TIA), and cerebral infarction without residual deficits: Secondary | ICD-10-CM | POA: Diagnosis not present

## 2018-05-25 DIAGNOSIS — Z170 Estrogen receptor positive status [ER+]: Secondary | ICD-10-CM | POA: Diagnosis not present

## 2018-05-25 DIAGNOSIS — C50912 Malignant neoplasm of unspecified site of left female breast: Secondary | ICD-10-CM | POA: Diagnosis not present

## 2018-05-25 DIAGNOSIS — F0390 Unspecified dementia without behavioral disturbance: Secondary | ICD-10-CM | POA: Diagnosis not present

## 2018-05-25 DIAGNOSIS — I10 Essential (primary) hypertension: Secondary | ICD-10-CM | POA: Diagnosis not present

## 2018-05-26 ENCOUNTER — Other Ambulatory Visit: Payer: Self-pay

## 2018-05-26 DIAGNOSIS — C50912 Malignant neoplasm of unspecified site of left female breast: Secondary | ICD-10-CM

## 2018-05-26 DIAGNOSIS — Z171 Estrogen receptor negative status [ER-]: Principal | ICD-10-CM

## 2018-05-26 DIAGNOSIS — D631 Anemia in chronic kidney disease: Secondary | ICD-10-CM

## 2018-05-26 DIAGNOSIS — N183 Chronic kidney disease, stage 3 (moderate): Principal | ICD-10-CM

## 2018-05-27 ENCOUNTER — Inpatient Hospital Stay (HOSPITAL_BASED_OUTPATIENT_CLINIC_OR_DEPARTMENT_OTHER): Payer: Medicare Other | Admitting: Oncology

## 2018-05-27 ENCOUNTER — Other Ambulatory Visit: Payer: Self-pay

## 2018-05-27 ENCOUNTER — Other Ambulatory Visit: Payer: Self-pay | Admitting: Family Medicine

## 2018-05-27 ENCOUNTER — Encounter: Payer: Self-pay | Admitting: Oncology

## 2018-05-27 ENCOUNTER — Inpatient Hospital Stay: Payer: Medicare Other | Attending: Oncology

## 2018-05-27 VITALS — BP 154/64 | HR 62 | Temp 97.8°F | Resp 18 | Ht 65.0 in | Wt 131.1 lb

## 2018-05-27 DIAGNOSIS — Z170 Estrogen receptor positive status [ER+]: Secondary | ICD-10-CM | POA: Insufficient documentation

## 2018-05-27 DIAGNOSIS — F0390 Unspecified dementia without behavioral disturbance: Secondary | ICD-10-CM | POA: Diagnosis not present

## 2018-05-27 DIAGNOSIS — M8580 Other specified disorders of bone density and structure, unspecified site: Secondary | ICD-10-CM

## 2018-05-27 DIAGNOSIS — N189 Chronic kidney disease, unspecified: Secondary | ICD-10-CM

## 2018-05-27 DIAGNOSIS — I129 Hypertensive chronic kidney disease with stage 1 through stage 4 chronic kidney disease, or unspecified chronic kidney disease: Secondary | ICD-10-CM | POA: Insufficient documentation

## 2018-05-27 DIAGNOSIS — N183 Chronic kidney disease, stage 3 (moderate): Principal | ICD-10-CM

## 2018-05-27 DIAGNOSIS — Z79811 Long term (current) use of aromatase inhibitors: Secondary | ICD-10-CM | POA: Diagnosis not present

## 2018-05-27 DIAGNOSIS — C50912 Malignant neoplasm of unspecified site of left female breast: Secondary | ICD-10-CM

## 2018-05-27 DIAGNOSIS — C50412 Malignant neoplasm of upper-outer quadrant of left female breast: Secondary | ICD-10-CM | POA: Diagnosis not present

## 2018-05-27 DIAGNOSIS — E785 Hyperlipidemia, unspecified: Secondary | ICD-10-CM | POA: Insufficient documentation

## 2018-05-27 DIAGNOSIS — D631 Anemia in chronic kidney disease: Secondary | ICD-10-CM

## 2018-05-27 DIAGNOSIS — Z171 Estrogen receptor negative status [ER-]: Secondary | ICD-10-CM

## 2018-05-27 DIAGNOSIS — Z5181 Encounter for therapeutic drug level monitoring: Secondary | ICD-10-CM

## 2018-05-27 LAB — COMPREHENSIVE METABOLIC PANEL
ALBUMIN: 3.8 g/dL (ref 3.5–5.0)
ALT: 6 U/L (ref 0–44)
ANION GAP: 10 (ref 5–15)
AST: 21 U/L (ref 15–41)
Alkaline Phosphatase: 45 U/L (ref 38–126)
BUN: 12 mg/dL (ref 8–23)
CHLORIDE: 99 mmol/L (ref 98–111)
CO2: 31 mmol/L (ref 22–32)
Calcium: 9.1 mg/dL (ref 8.9–10.3)
Creatinine, Ser: 0.72 mg/dL (ref 0.44–1.00)
GFR calc Af Amer: >60 mL/min (ref >60)
GFR calc non Af Amer: >60 mL/min (ref >60)
GLUCOSE: 85 mg/dL (ref 70–99)
POTASSIUM: 3.8 mmol/L (ref 3.5–5.1)
Sodium: 140 mmol/L (ref 135–145)
Total Bilirubin: 0.4 mg/dL (ref 0.3–1.2)
Total Protein: 7.6 g/dL (ref 6.5–8.1)

## 2018-05-27 LAB — CBC WITH DIFFERENTIAL/PLATELET
BASOS ABS: 0.1 10*3/uL (ref 0–0.1)
BASOS PCT: 1 %
EOS ABS: 0.1 10*3/uL (ref 0–0.7)
EOS PCT: 2 %
HCT: 32.7 % — ABNORMAL LOW (ref 35.0–47.0)
Hemoglobin: 10.7 g/dL — ABNORMAL LOW (ref 12.0–16.0)
Lymphocytes Relative: 23 %
Lymphs Abs: 1.9 10*3/uL (ref 1.0–3.6)
MCH: 29.5 pg (ref 26.0–34.0)
MCHC: 32.8 g/dL (ref 32.0–36.0)
MCV: 89.9 fL (ref 80.0–100.0)
MONO ABS: 0.8 10*3/uL (ref 0.2–0.9)
MONOS PCT: 10 %
Neutro Abs: 5.4 10*3/uL (ref 1.4–6.5)
Neutrophils Relative %: 64 %
PLATELETS: 193 10*3/uL (ref 150–440)
RBC: 3.63 MIL/uL — ABNORMAL LOW (ref 3.80–5.20)
RDW: 14.5 % (ref 11.5–14.5)
WBC: 8.3 10*3/uL (ref 3.6–11.0)

## 2018-05-27 NOTE — Telephone Encounter (Signed)
We don't prescribe her glaucoma medicine They will need to contact her eye doctor

## 2018-05-30 NOTE — Progress Notes (Signed)
Hematology/Oncology Consult note [facility]  Telephone:([phone] [phone]  Patient Care Team: [name], MD as PCP - General (Family Medicine) Mohammed Kindle, MD as Attending Physician (Pain Medicine) Anabel Bene, MD as Referring Physician (Neurology)   Name of the patient: Melissa Vanorman  [id number]  [birth year]   Date of visit: 05/30/18  Diagnosis- invasive mammary carcinoma of the left breast clinical prognostic stage IIA T3 N0 M0 ER 90% positive PR 1% positive HER-2/neu negative   Chief complaint/ Reason for visit- assess tolerance to arimidex  Heme/Onc history: patient is a 84 year old female with a past medical history significant for dementia, hypertension, hyperlipidemia and chronic kidney disease among other medical problems. She has been a resident of group home since 2015 due to her gradually worsening dementia. Her only close family currently is her daughter who lives in [place] and is with her today along with her caregiver from group home. With regards to her dementia patient is able to ambulate with the help of a walker. She can carry out conversations and is not incontinent of her urine and stool. She is able to eat without any aspiration issues and overall she has been doing well at the nursing home without any recent hospitalizations. Her caregiver noticed a left breast mass recently which prompted a mammogram. Mammogram showed a 5.1 cm mass in the 2 o'clock position of the left breast. No evidence of axillary adenopathy. No evidence of breast masses in the right breast. Patient has had a prior breast biopsy and surgery in the 1970s which the daughter sayswas not malignancy. No other family history of breast cancer, ovarian cancer, colon or prostate cancer. There is a family history of malignant hyperthermia with nitrous oxide.  Patient is currently pleasantly confused. She is not aware about her  underlying medical condition and history is obtained as above with the help of her daughter and caregiver. Overall her appetite is good and she has not had any unintentional weight loss. She does not complain of any pain today  Patient underwent core biopsy which showed invasive mammary carcinoma, grade 2, ER greater than 90% positive, PR 1% positive and HER-2 equivocal by IHC.  FISH negative  Patient was seen by Dr. Dahlia Byes from surgery and given family history of malignant hyperthermia along with patient age and dementia she was not deemed to be a surgical candidate. arimidex started on 04/14/18  Interval history- Patient is here with her daughter and caregiver from group home.  They report that patient has not voiced any new complaints or concerns while on Arimidex.  She has significant dementia and therefore we are unable to obtain any meaningful history from the patient.  ECOG PS- 2-3 Pain scale- 0   Review of systems- Review of Systems  Unable to perform ROS: Dementia      No Known Allergies   Past Medical History:  Diagnosis Date  . Allergy   . Anemia   . Arthritis   . B12 deficiency 11/06/2015   Less than 400 October 2016  . Chronic constipation   . Dementia   . Dementia   . Family history of adverse reaction to anesthesia    brother, daughter and grandson had malignant hyperthermia in the past  . Glaucoma   . History of fibrocystic disease of breast    biopsy done, negative  . History of hand fracture    left  . History of shingles 2014  . History of wrist  fracture    left  . Hypertension   . Impacted cerumen   . Malignant hyperthermia   . Pneumonia   . Postherpetic neuralgia   . TIA (transient ischemic attack) Feb 2016     Past Surgical History:  Procedure Laterality Date  . BREAST BIOPSY Left 03/24/2018   path pending  . BREAST LUMPECTOMY     unsure which side  . CATARACT EXTRACTION W/PHACO Right 01/12/2017   Procedure: CATARACT EXTRACTION PHACO AND  INTRAOCULAR LENS PLACEMENT (IOC);  Surgeon: Birder Robson, MD;  Location: [facility];  Service: Ophthalmology;  Laterality: Right;  [place] 1:05.8 AP% 26.0 CDE 17.07 Fluid pack lot # [id number]  . ESOPHAGOGASTRODUODENOSCOPY (EGD) WITH PROPOFOL N/A 02/10/2018   Procedure: ESOPHAGOGASTRODUODENOSCOPY (EGD) WITH PROPOFOL;  Surgeon: Lin Landsman, MD;  Location: [place];  Service: Gastroenterology;  Laterality: N/A;    Social History   Socioeconomic History  . Marital status: Single    Spouse name: Not on file  . Number of children: Not on file  . Years of education: Not on file  . Highest education level: Not on file  Occupational History  . Not on file  Social Needs  . Financial resource strain: Not on file  . Food insecurity:    Worry: Not on file    Inability: Not on file  . Transportation needs:    Medical: Not on file    Non-medical: Not on file  Tobacco Use  . Smoking status: Never Smoker  . Smokeless tobacco: Never Used  Substance and Sexual Activity  . Alcohol use: No    Alcohol/week: 0.0 standard drinks  . Drug use: No  . Sexual activity: Never  Lifestyle  . Physical activity:    Days per week: Not on file    Minutes per session: Not on file  . Stress: Not on file  Relationships  . Social connections:    Talks on phone: Not on file    Gets together: Not on file    Attends religious service: Not on file    Active member of club or organization: Not on file    Attends meetings of clubs or organizations: Not on file    Relationship status: Not on file  . Intimate partner violence:    Fear of current or ex partner: Not on file    Emotionally abused: Not on file    Physically abused: Not on file    Forced sexual activity: Not on file  Other Topics Concern  . Not on file  Social History Narrative  . Not on file    Family History  Family history unknown: Yes     Current Outpatient Medications:  .  anastrozole (ARIMIDEX) 1 MG tablet, Take 1 tablet  (1 mg total) by mouth daily., Disp: 30 tablet, Rfl: 3 .  brimonidine-timolol (COMBIGAN) 0.2-0.5 % ophthalmic solution, Place 1 drop into both eyes 2 (two) times daily., Disp: , Rfl:  .  Calcium Carb-Cholecalciferol (CALCIUM-VITAMIN D) 600-400 MG-UNIT TABS, Take 1 tablet by mouth 2 (two) times daily., Disp: 60 tablet, Rfl: 3 .  Diapers & Supplies (HUGGIES PULL-UPS) MISC, 1 each by Does not apply route 3 (three) times daily. Adult size medium, urinary and stool incontinence, LON 99 months, Disp: 100 each, Rfl: 5 .  divalproex (DEPAKOTE SPRINKLES) 125 MG capsule, Take 125 mg by mouth 3 (three) times daily. , Disp: , Rfl:  .  divalproex (DEPAKOTE) 125 MG DR tablet, Take 125 mg by mouth daily as needed. May  take additional 4th tablet as needed to go along with 3 times daily order., Disp: , Rfl:  .  donepezil (ARICEPT) 10 MG tablet, Take 1 tablet (10 mg total) by mouth at bedtime., Disp: 30 tablet, Rfl: 11 .  latanoprost (XALATAN) 0.005 % ophthalmic solution, Place 1 drop into both eyes at bedtime., Disp: , Rfl:  .  NAMENDA XR 28 MG CP24 24 hr capsule, TAKE 1 CAPSULE BY MOUTH ONCE DAILY FOR MEMORY, Disp: 30 capsule, Rfl: 11 .  omeprazole (PRILOSEC) 20 MG capsule, Take 1 capsule (20 mg total) by mouth daily., Disp: 30 capsule, Rfl: 3 .  pregabalin (LYRICA) 25 MG capsule, 25 mg qhs every other night, Disp: 30 capsule, Rfl: 2 .  acetaminophen (TYLENOL 8 HOUR ARTHRITIS PAIN) 650 MG CR tablet, Take 650 mg by mouth 3 (three) times daily as needed. , Disp: , Rfl:  .  amLODipine (NORVASC) 5 MG tablet, TAKE ONE TABLET BY MOUTH EVERY DAY, Disp: 30 tablet, Rfl: 5 .  ASPIRIN ADULT LOW STRENGTH 81 MG EC tablet, TAKE ONE TABLET BY MOUTH EVERY DAY *DO NOT CRUSH*, Disp: 30 tablet, Rfl: 5 .  Bismuth Subsalicylate 749 SW/96PR SUSP, Take 30 mLs (1,050 mg total) by mouth 2 (two) times daily as needed., Disp: , Rfl:  .  carvedilol (COREG) 6.25 MG tablet, TAKE ONE TABLET BY MOUTH TWO TIMES A DAY, Disp: 60 tablet, Rfl: 5 .   citalopram (CELEXA) 10 MG tablet, TAKE ONE TABLET BY MOUTH EVERY DAY, Disp: 30 tablet, Rfl: 5 .  diclofenac sodium (VOLTAREN) 1 % GEL, APPLY 2GM TO EACH KNEE TWO TIMES A DAY WHILE AWAKE (BRAND PER INS), Disp: 100 g, Rfl: 5 .  Elastic Bandages & Supports (MEDICAL COMPRESSION STOCKINGS) MISC, Put stockings on every morning and remove every night; do not sleep in stockings; get the kind with open toes (Patient not taking: Reported on 05/27/2018), Disp: 2 each, Rfl: 0 .  fluticasone (FLONASE) 50 MCG/ACT nasal spray, USE 2 SPRAYS IN BOTH NOSTRILS ONCE DAILYFOR RHINITIS (Patient not taking: Reported on 04/14/2018), Disp: 16 g, Rfl: 11 .  lisinopril (PRINIVIL,ZESTRIL) 20 MG tablet, TAKE ONE TABLET BY MOUTH EVERY DAY, Disp: 30 tablet, Rfl: 5 .  montelukast (SINGULAIR) 5 MG chewable tablet, CHEW 1 TABLET BY MOUTH AT BEDTIME, Disp: 30 tablet, Rfl: 5 .  polyethylene glycol powder (GLYCOLAX/MIRALAX) powder, MIX 17 GR (1 CAPFUL) IN 8 OZ WATER/JUICEAND DRINK ONCE DAILY AS NEEDED TO FOR CONSTIPATION (Patient not taking: Reported on 04/14/2018), Disp: 527 g, Rfl: 11 .  pravastatin (PRAVACHOL) 20 MG tablet, TAKE ONE TABLET BY MOUTH AT BEDTIME, Disp: 30 tablet, Rfl: 5 .  vitamin B-12 (CYANOCOBALAMIN) 500 MCG tablet, TAKE ONE TABLET BY MOUTH EVERY DAY, Disp: 30 tablet, Rfl: 5  Physical exam:  Vitals:   05/27/18 1022  BP: (!) 154/64  Pulse: 62  Resp: 18  Temp: 97.8 F (36.6 C)  TempSrc: Oral  SpO2: 96%  Weight: 131 lb 1.6 oz (59.5 kg)  Height: 5' 5"  (1.651 m)   Physical Exam  Constitutional:  Frail elderly female sitting in a wheelchair.  She is pleasantly confused and does not any acute distress  HENT:  Head: Normocephalic and atraumatic.  Eyes: Pupils are equal, round, and reactive to light. EOM are normal.  Neck: Normal range of motion.  Cardiovascular: Normal rate, regular rhythm and normal heart sounds.  Pulmonary/Chest: Effort normal and breath sounds normal.  Abdominal: Soft. Bowel sounds are normal.   Neurological:  Oriented to self only  Skin: Skin is warm and dry.  Large palpable mass in her left breast around the periareolar region.  There is some skin thickening and puckering of the left areola.  No satellite skin nodules or.  CMP Latest Ref Rng & Units 05/27/2018  Glucose 70 - 99 mg/dL 85  BUN 8 - 23 mg/dL 12  Creatinine 0.44 - 1.00 mg/dL 0.72  Sodium 135 - 145 mmol/L 140  Potassium 3.5 - 5.1 mmol/L 3.8  Chloride 98 - 111 mmol/L 99  CO2 22 - 32 mmol/L 31  Calcium 8.9 - 10.3 mg/dL 9.1  Total Protein 6.5 - 8.1 g/dL 7.6  Total Bilirubin 0.3 - 1.2 mg/dL 0.4  Alkaline Phos 38 - 126 U/L 45  AST 15 - 41 U/L 21  ALT 0 - 44 U/L 6   CBC Latest Ref Rng & Units 05/27/2018  WBC 3.6 - 11.0 K/uL 8.3  Hemoglobin 12.0 - 16.0 g/dL 10.7(L)  Hematocrit 35.0 - 47.0 % 32.7(L)  Platelets 150 - 440 K/uL 193    No images are attached to the encounter.  No results found.   Assessment and plan- Patient is a 84 y.o. female invasive mammary carcinoma of the left breast clinically prognostic stage II aT3 N0 M0 ER 90% positive, PR 1% positive and HER-2/neu negative.  She was not deemed to be a surgical candidate and is currently on Arimidex  Patient seems to be tolerating her Arimidex well along with calcium and vitamin D.  She has not voiced any specific concerns on this medication.  Her bone density scan did show osteopenia but was not significant enough to warrant bisphosphonates at this time.  We will monitor that closely.  Clinically it may take several months for her Arimidex to show meaningful clinical response.  She continues to have a large breast mass goal at this time is to prevent it from growing and causing skin ulcerations.  We will not be checking any routine scans or mammograms intent at this time is palliative.  Patient's daughter is in understanding of the plan.  I will see her back in 3 months time to clinically assess her breast mass.  If there are any concerns of skin breakdown or  ulceration, I have asked the group home caregivers to get in touch with [place]    Visit Diagnosis 1. Malignant neoplasm of left breast in female, estrogen receptor negative, unspecified site of breast ([place])   2. Visit for monitoring Arimidex therapy      Dr. Randa Evens, MD, MPH [facility] at [facility] [phone] 05/30/2018 9:08 AM

## 2018-06-01 DIAGNOSIS — I10 Essential (primary) hypertension: Secondary | ICD-10-CM | POA: Diagnosis not present

## 2018-06-01 DIAGNOSIS — F0390 Unspecified dementia without behavioral disturbance: Secondary | ICD-10-CM | POA: Diagnosis not present

## 2018-06-01 DIAGNOSIS — C50912 Malignant neoplasm of unspecified site of left female breast: Secondary | ICD-10-CM | POA: Diagnosis not present

## 2018-06-01 DIAGNOSIS — Z8673 Personal history of transient ischemic attack (TIA), and cerebral infarction without residual deficits: Secondary | ICD-10-CM | POA: Diagnosis not present

## 2018-06-01 DIAGNOSIS — Z170 Estrogen receptor positive status [ER+]: Secondary | ICD-10-CM | POA: Diagnosis not present

## 2018-07-26 ENCOUNTER — Other Ambulatory Visit: Payer: Self-pay

## 2018-07-26 DIAGNOSIS — B0229 Other postherpetic nervous system involvement: Secondary | ICD-10-CM

## 2018-07-26 NOTE — Telephone Encounter (Signed)
We don't prescribe her Lyrica (pregabalin)

## 2018-08-05 ENCOUNTER — Ambulatory Visit: Payer: Medicare Other | Admitting: Family Medicine

## 2018-08-16 ENCOUNTER — Ambulatory Visit (INDEPENDENT_AMBULATORY_CARE_PROVIDER_SITE_OTHER): Payer: Medicare Other | Admitting: Family Medicine

## 2018-08-16 ENCOUNTER — Encounter: Payer: Self-pay | Admitting: Family Medicine

## 2018-08-16 VITALS — BP 128/72 | HR 68 | Temp 98.4°F | Ht 64.0 in | Wt 130.6 lb

## 2018-08-16 DIAGNOSIS — F0390 Unspecified dementia without behavioral disturbance: Secondary | ICD-10-CM

## 2018-08-16 DIAGNOSIS — Z5181 Encounter for therapeutic drug level monitoring: Secondary | ICD-10-CM | POA: Diagnosis not present

## 2018-08-16 DIAGNOSIS — Z170 Estrogen receptor positive status [ER+]: Secondary | ICD-10-CM

## 2018-08-16 DIAGNOSIS — D631 Anemia in chronic kidney disease: Secondary | ICD-10-CM | POA: Diagnosis not present

## 2018-08-16 DIAGNOSIS — Z23 Encounter for immunization: Secondary | ICD-10-CM | POA: Diagnosis not present

## 2018-08-16 DIAGNOSIS — G459 Transient cerebral ischemic attack, unspecified: Secondary | ICD-10-CM | POA: Diagnosis not present

## 2018-08-16 DIAGNOSIS — E785 Hyperlipidemia, unspecified: Secondary | ICD-10-CM

## 2018-08-16 DIAGNOSIS — N183 Chronic kidney disease, stage 3 unspecified: Secondary | ICD-10-CM

## 2018-08-16 DIAGNOSIS — C50412 Malignant neoplasm of upper-outer quadrant of left female breast: Secondary | ICD-10-CM | POA: Diagnosis not present

## 2018-08-16 DIAGNOSIS — I10 Essential (primary) hypertension: Secondary | ICD-10-CM

## 2018-08-16 NOTE — Assessment & Plan Note (Signed)
Monitored by heme-onc 

## 2018-08-16 NOTE — Assessment & Plan Note (Signed)
Check lipids today; with her diagnosis of breast cancer and age, will be willing to be less aggressive with lipid-lowering; advised patient should not be offered bacon and sausage with breakfast if she is just as content with cereal and bagels, etc.

## 2018-08-16 NOTE — Assessment & Plan Note (Signed)
Advised caregiver to notify neurologist of her behaviors, yelling for example, as medicine dose may be in order; consider getting baby doll to give patient seomthingn to fidget with

## 2018-08-16 NOTE — Progress Notes (Signed)
BP 128/72   Pulse 68   Temp 98.4 F (36.9 C) (Oral)   Ht 5\' 4"  (1.626 m)   Wt 130 lb 9.6 oz (59.2 kg)   SpO2 97%   BMI 22.42 kg/m    Subjective:    Patient ID: Melissa Combs, female    DOB: 10-31-1929, 84 y.o.   MRN: [id number]  HPI: Melissa Combs is a 84 y.o. female  Chief Complaint  Patient presents with  . Follow-up    HPI Patient is here for f/u; here with caregiver from her group home; closest relative is daughter in [place]  HTN; well-controlled with amlodipine, carvedilol, lisinopril  Breast cancer; being treated with Arimedex; seeing oncologist; last visit with Dr. Janese Banks was on 05/27/18; note reviewed; patient is not aware of her medical condition; she saw the surgeon and was deemed to NOT be a candidate for surgery  High cholesterol; on pravastatin; last ALT normal in Sept 2019 (ALT was 6); last total chol was 200, last LDL was 118; she is getting cereal every morning or oatmeal; bacon or sausage; sometimes bagels  Flu shot today  Dementia; on aricept; seeing neurologist; about the same or maybe a little worse says the caregiver; still appropriate for where she is, caregiver believes they can meet her needs  Anemia due to stage 3 CKD; seeing oncologist for this too; last 4 CBCs reviewed; most recent H/H 10.7/32.7, normocytic, normochromic  Seeing neurologist; Dr. Melrose Nakayama is taking care of the depakote   Depression screen [facility] 2/9 08/16/2018 02/03/2018 01/27/2018 11/04/2017 09/07/2017  Decreased Interest 0 0 0 0 0  Down, Depressed, Hopeless 0 0 0 0 0  PHQ - 2 Score 0 0 0 0 0  Altered sleeping 0 - - - -  Tired, decreased energy 0 - - - -  Change in appetite 0 - - - -  Feeling bad or failure about yourself  0 - - - -  Trouble concentrating 0 - - - -  Moving slowly or fidgety/restless 0 - - - -  Suicidal thoughts 0 - - - -  PHQ-9 Score 0 - - - -  Difficult doing work/chores Not difficult at all - - - -   Fall Risk  08/16/2018 03/09/2018 02/03/2018 01/27/2018  11/04/2017  Falls in the past year? 0 No No No No  Comment - - - - -  Number falls in past yr: 0 - - - -  Injury with Fall? 0 - - - -  Risk for fall due to : - - - - -    Relevant past medical, surgical, family and social history reviewed Past Medical History:  Diagnosis Date  . Allergy   . Anemia   . Arthritis   . B12 deficiency 11/06/2015   Less than 400 October 2016  . Chronic constipation   . Dementia ([place])   . Dementia ([place])   . Family history of adverse reaction to anesthesia    brother, daughter and grandson had malignant hyperthermia in the past  . Glaucoma   . History of fibrocystic disease of breast    biopsy done, negative  . History of hand fracture    left  . History of shingles 2014  . History of wrist fracture    left  . Hypertension   . Impacted cerumen   . Malignant hyperthermia   . Pneumonia   . Postherpetic neuralgia   . TIA (transient ischemic attack) Feb 2016   Past Surgical History:  Procedure Laterality Date  . BREAST BIOPSY Left 03/24/2018   path pending  . BREAST LUMPECTOMY     unsure which side  . CATARACT EXTRACTION W/PHACO Right 01/12/2017   Procedure: CATARACT EXTRACTION PHACO AND INTRAOCULAR LENS PLACEMENT (IOC);  Surgeon: Birder Robson, MD;  Location: [facility];  Service: Ophthalmology;  Laterality: Right;  [place] 1:05.8 AP% 26.0 CDE 17.07 Fluid pack lot # [id number]  . ESOPHAGOGASTRODUODENOSCOPY (EGD) WITH PROPOFOL N/A 02/10/2018   Procedure: ESOPHAGOGASTRODUODENOSCOPY (EGD) WITH PROPOFOL;  Surgeon: Lin Landsman, MD;  Location: [place];  Service: Gastroenterology;  Laterality: N/A;   Family History  Family history unknown: Yes   Social History   Tobacco Use  . Smoking status: Never Smoker  . Smokeless tobacco: Never Used  Substance Use Topics  . Alcohol use: No    Alcohol/week: 0.0 standard drinks  . Drug use: No     [facility] Visit from 08/16/2018 in [facility]  AUDIT-C Score  0      Interim  medical history since last visit reviewed. Allergies and medications reviewed  Review of Systems Per HPI unless specifically indicated above     Objective:    BP 128/72   Pulse 68   Temp 98.4 F (36.9 C) (Oral)   Ht 5\' 4"  (1.626 m)   Wt 130 lb 9.6 oz (59.2 kg)   SpO2 97%   BMI 22.42 kg/m   Wt Readings from Last 3 Encounters:  08/16/18 130 lb 9.6 oz (59.2 kg)  05/27/18 131 lb 1.6 oz (59.5 kg)  04/14/18 129 lb 6.4 oz (58.7 kg)    Physical Exam Constitutional:      General: She is not in acute distress.    Appearance: She is well-developed. She is not diaphoretic.  HENT:     Head: Normocephalic and atraumatic.  Eyes:     General: No scleral icterus. Neck:     Thyroid: No thyromegaly.  Cardiovascular:     Rate and Rhythm: Normal rate and regular rhythm.     Heart sounds: Normal heart sounds. No murmur.  Pulmonary:     Effort: Pulmonary effort is normal. No respiratory distress.     Breath sounds: Normal breath sounds. No wheezing.  Abdominal:     General: Bowel sounds are normal. There is no distension.     Palpations: Abdomen is soft.  Skin:    General: Skin is warm and dry.     Coloration: Skin is not pale.  Neurological:     Mental Status: She is alert.  Psychiatric:        Behavior: Behavior normal.        Thought Content: Thought content normal.        Cognition and Memory: Cognition is impaired. Memory is impaired.        Judgment: Judgment is impulsive and inappropriate.     Comments: Hollering out; repeating phrases over and over again     Results for orders placed or performed in visit on 05/27/18  CBC with Differential/Platelet  Result Value Ref Range   WBC 8.3 3.6 - 11.0 K/uL   RBC 3.63 (L) 3.80 - 5.20 MIL/uL   Hemoglobin 10.7 (L) 12.0 - 16.0 g/dL   HCT 32.7 (L) 35.0 - 47.0 %   MCV 89.9 80.0 - 100.0 fL   MCH 29.5 26.0 - 34.0 pg   MCHC 32.8 32.0 - 36.0 g/dL   RDW 14.5 11.5 - 14.5 %   Platelets 193 150 - 440 K/uL  Neutrophils Relative % 64 %     Neutro Abs 5.4 1.4 - 6.5 K/uL   Lymphocytes Relative 23 %   Lymphs Abs 1.9 1.0 - 3.6 K/uL   Monocytes Relative 10 %   Monocytes Absolute 0.8 0.2 - 0.9 K/uL   Eosinophils Relative 2 %   Eosinophils Absolute 0.1 0 - 0.7 K/uL   Basophils Relative 1 %   Basophils Absolute 0.1 0 - 0.1 K/uL  Comprehensive metabolic panel  Result Value Ref Range   Sodium 140 135 - 145 mmol/L   Potassium 3.8 3.5 - 5.1 mmol/L   Chloride 99 98 - 111 mmol/L   CO2 31 22 - 32 mmol/L   Glucose, Bld 85 70 - 99 mg/dL   BUN 12 8 - 23 mg/dL   Creatinine, Ser 0.72 0.44 - 1.00 mg/dL   Calcium 9.1 8.9 - 10.3 mg/dL   Total Protein 7.6 6.5 - 8.1 g/dL   Albumin 3.8 3.5 - 5.0 g/dL   AST 21 15 - 41 U/L   ALT 6 0 - 44 U/L   Alkaline Phosphatase 45 38 - 126 U/L   Total Bilirubin 0.4 0.3 - 1.2 mg/dL   GFR calc non Af Amer >60 >60 mL/min   GFR calc Af Amer >60 >60 mL/min   Anion gap 10 5 - 15      Assessment & Plan:   Problem List Items Addressed This Visit      Cardiovascular and Mediastinum   TIA (transient ischemic attack)   Hypertension     Nervous and Auditory   Dementia (HCC)    Advised caregiver to notify neurologist of her behaviors, yelling for example, as medicine dose may be in order; consider getting baby doll to give patient seomthingn to fidget with        Genitourinary   CKD (chronic kidney disease) stage 3, GFR 30-59 ml/min (HCC)    Caregiver avers that she does not received NSAIDs; just tylenol; no MOM        Other   Medication monitoring encounter   Malignant neoplasm of upper-outer quadrant of left breast in female, estrogen receptor positive ([place])    Continue to f/u with heme onc; continue medicine; not a surgical candidate      Dyslipidemia - Primary    Check lipids today; with her diagnosis of breast cancer and age, will be willing to be less aggressive with lipid-lowering; advised patient should not be offered bacon and sausage with breakfast if she is just as content with cereal  and bagels, etc.      Relevant Orders   Lipid panel   Anemia    Monitored by heme onc       Other Visit Diagnoses    Need for influenza vaccination       Relevant Orders   Flu vaccine HIGH DOSE PF (Fluzone High dose) (Completed)   Lipid panel       Follow up plan: Return in about 3 months (around 11/15/2018) for follow-up visit with Dr. Sanda Klein.  An after-visit summary was printed and given to the patient at [facility].  Please see the patient instructions which may contain other information and recommendations beyond what is mentioned above in the assessment and plan.  No orders of the defined types were placed in this encounter.   Orders Placed This Encounter  Procedures  . Flu vaccine HIGH DOSE PF (Fluzone High dose)  . Lipid panel

## 2018-08-16 NOTE — Patient Instructions (Addendum)
Please do not provide bacon or sausage for breakfast  Consider getting her a baby doll with clothes that she can change  If you need something for aches or pains, try to use Tylenol (acetaminophen) instead of non-steroidals (which include Aleve, ibuprofen, Advil, Motrin, and naproxen); non-steroidals can cause long-term kidney damage  Let's get labs today  Continue follow-up with Dr. Melrose Nakayama and Dr. Janese Banks

## 2018-08-16 NOTE — Assessment & Plan Note (Signed)
Caregiver avers that she does not received NSAIDs; just tylenol; no MOM

## 2018-08-16 NOTE — Assessment & Plan Note (Signed)
Continue to f/u with heme onc; continue medicine; not a surgical candidate

## 2018-08-17 ENCOUNTER — Other Ambulatory Visit: Payer: Self-pay | Admitting: Family Medicine

## 2018-08-17 DIAGNOSIS — B0229 Other postherpetic nervous system involvement: Secondary | ICD-10-CM

## 2018-08-17 DIAGNOSIS — E785 Hyperlipidemia, unspecified: Secondary | ICD-10-CM

## 2018-08-17 DIAGNOSIS — G459 Transient cerebral ischemic attack, unspecified: Secondary | ICD-10-CM

## 2018-08-17 LAB — LIPID PANEL
Cholesterol: 211 mg/dL — ABNORMAL HIGH (ref <200)
HDL: 61 mg/dL (ref >50)
LDL Cholesterol (Calc): 131 mg/dL (calc) — ABNORMAL HIGH
Non-HDL Cholesterol (Calc): 150 mg/dL (calc) — ABNORMAL HIGH (ref <130)
Total CHOL/HDL Ratio: 3.5 (calc) (ref <5.0)
Triglycerides: 86 mg/dL (ref <150)

## 2018-08-17 MED ORDER — PRAVASTATIN SODIUM 40 MG PO TABS: 40.0000 mg | ORAL_TABLET | Freq: Every day | ORAL | 3 refills | Status: DC

## 2018-08-17 NOTE — Progress Notes (Signed)
STOP pravastatin 20 mg START pravastatin 40 mg one by mouth daily Recheck non-fasting lipids in 6 to 8 weeks (I've ordered)

## 2018-08-19 ENCOUNTER — Telehealth: Payer: Self-pay | Admitting: Family Medicine

## 2018-08-19 NOTE — Telephone Encounter (Signed)
Phoned in did not go through

## 2018-08-19 NOTE — Telephone Encounter (Signed)
Copied from [place] [id number]. Topic: Quick Communication - See Telephone Encounter >> Aug 19, 2018 11:04 AM Bea Graff, NT wrote: CRM for notification. See Telephone encounter for: 08/19/18. Joy with [facility] states the pharamcy did not receive the rx for  the pravastatin (PRAVACHOL) 40 MG tablet. Requesting to be resent.  CB#: [phone]

## 2018-08-26 ENCOUNTER — Inpatient Hospital Stay: Payer: Medicare Other

## 2018-08-26 ENCOUNTER — Inpatient Hospital Stay: Payer: Medicare Other | Admitting: Oncology

## 2018-08-29 ENCOUNTER — Telehealth: Payer: Self-pay | Admitting: Family Medicine

## 2018-08-29 NOTE — Telephone Encounter (Signed)
Copied from [place] [phone]. Topic: Quick Communication - See Telephone Encounter >> Aug 29, 2018  1:19 PM Burchel, Abbi R wrote: CRM for notification. See Telephone encounter for: 08/29/18.  Juliann Pulse from [facility] [phone] called to f/up on pt's dietary restrictions (specifically no bacon/sausage). Please advise.

## 2018-08-29 NOTE — Telephone Encounter (Signed)
It's pretty straight forward I'd like to request that she not be served bacon or sausage You can write that on an order pad if they need that Thank you

## 2018-08-30 NOTE — Telephone Encounter (Signed)
Melissa Combs does not work for [employer].  Melissa Combs is with the [facility] health service regualtions  [facility] has the order not to serve bacon or sausage. But the pt was served this anyway. Melissa Combs wants to know what and why pt has this restriction, and what is the concern Dr Sanda Klein has on her eating either of these? If it is due to her cholesterol level, she would like pt's last reading.   Please call back cb # [phone]

## 2018-08-30 NOTE — Telephone Encounter (Signed)
I returned call; I do not recognize her or her title She is doing an audit I'd like clearance and asked for clearance before I talk to her any further about specific patient orders or test results; no information given today Miel, office manager, is speaking with her now I will be able to talk about specific patient conditions and provide lipid results if she has permission to receive that information only

## 2018-08-30 NOTE — Telephone Encounter (Signed)
Spoke with Juliann Pulse and gave her our office fax number in order to send over a request of information form.  She will fax the form to my attention.

## 2018-09-01 NOTE — Telephone Encounter (Signed)
I left a message to answer her questions Limiting saturated fats in the diet by just cutting out two fatty breakfast meats Gave her last total cholesterol and LDL cholesterol, explained she is now on medicine I did not leave patient's name or DOB, as I didn't know if secure voicemail; I did let her know who I was and that I was returning her call regarding the audit She is welcome to call me back if she needs other information, but I hope that explanation is enough and answers her questions

## 2018-09-15 ENCOUNTER — Encounter: Payer: Self-pay | Admitting: Oncology

## 2018-09-15 ENCOUNTER — Inpatient Hospital Stay: Payer: Medicare Other | Attending: Oncology

## 2018-09-15 ENCOUNTER — Inpatient Hospital Stay (HOSPITAL_BASED_OUTPATIENT_CLINIC_OR_DEPARTMENT_OTHER): Payer: Medicare Other | Admitting: Oncology

## 2018-09-15 VITALS — BP 173/74 | HR 54 | Temp 95.8°F | Wt 127.0 lb

## 2018-09-15 DIAGNOSIS — Z171 Estrogen receptor negative status [ER-]: Secondary | ICD-10-CM

## 2018-09-15 DIAGNOSIS — N189 Chronic kidney disease, unspecified: Secondary | ICD-10-CM | POA: Diagnosis not present

## 2018-09-15 DIAGNOSIS — E785 Hyperlipidemia, unspecified: Secondary | ICD-10-CM | POA: Insufficient documentation

## 2018-09-15 DIAGNOSIS — I10 Essential (primary) hypertension: Secondary | ICD-10-CM

## 2018-09-15 DIAGNOSIS — Z79899 Other long term (current) drug therapy: Secondary | ICD-10-CM

## 2018-09-15 DIAGNOSIS — Z170 Estrogen receptor positive status [ER+]: Secondary | ICD-10-CM | POA: Insufficient documentation

## 2018-09-15 DIAGNOSIS — C50412 Malignant neoplasm of upper-outer quadrant of left female breast: Secondary | ICD-10-CM | POA: Diagnosis not present

## 2018-09-15 DIAGNOSIS — F0390 Unspecified dementia without behavioral disturbance: Secondary | ICD-10-CM | POA: Diagnosis not present

## 2018-09-15 DIAGNOSIS — Z79811 Long term (current) use of aromatase inhibitors: Secondary | ICD-10-CM

## 2018-09-15 DIAGNOSIS — R300 Dysuria: Secondary | ICD-10-CM

## 2018-09-15 DIAGNOSIS — N3000 Acute cystitis without hematuria: Secondary | ICD-10-CM

## 2018-09-15 DIAGNOSIS — Z5181 Encounter for therapeutic drug level monitoring: Secondary | ICD-10-CM

## 2018-09-15 DIAGNOSIS — C50912 Malignant neoplasm of unspecified site of left female breast: Secondary | ICD-10-CM

## 2018-09-15 LAB — COMPREHENSIVE METABOLIC PANEL
ALK PHOS: 45 U/L (ref 38–126)
ALT: 6 U/L (ref 0–44)
AST: 19 U/L (ref 15–41)
Albumin: 3.9 g/dL (ref 3.5–5.0)
Anion gap: 7 (ref 5–15)
BUN: 14 mg/dL (ref 8–23)
CALCIUM: 9.2 mg/dL (ref 8.9–10.3)
CO2: 33 mmol/L — ABNORMAL HIGH (ref 22–32)
Chloride: 98 mmol/L (ref 98–111)
Creatinine, Ser: 0.72 mg/dL (ref 0.44–1.00)
GFR calc Af Amer: >60 mL/min (ref >60)
GFR calc non Af Amer: >60 mL/min (ref >60)
Glucose, Bld: 88 mg/dL (ref 70–99)
Potassium: 3.7 mmol/L (ref 3.5–5.1)
Sodium: 138 mmol/L (ref 135–145)
Total Bilirubin: 0.6 mg/dL (ref 0.3–1.2)
Total Protein: 7.7 g/dL (ref 6.5–8.1)

## 2018-09-15 LAB — URINALYSIS, COMPLETE (UACMP) WITH MICROSCOPIC
Bilirubin Urine: NEGATIVE
Glucose, UA: NEGATIVE mg/dL
Ketones, ur: NEGATIVE mg/dL
Nitrite: NEGATIVE
PROTEIN: NEGATIVE mg/dL
Specific Gravity, Urine: 1.009 (ref 1.005–1.030)
pH: 7 (ref 5.0–8.0)

## 2018-09-15 NOTE — Progress Notes (Signed)
Breast Follow up. Her breast looks the same per caregiver of family care home.Large palpable mass in L outer breast.  No opening in skin.Pt taking anastrozole every day. Eats 2 good meals a day. Has agitation every afternoon from her dementia. CG wonders if pt may have UTI with  The increasing agitation noted.

## 2018-09-16 ENCOUNTER — Telehealth: Payer: Self-pay | Admitting: *Deleted

## 2018-09-16 ENCOUNTER — Other Ambulatory Visit: Payer: Self-pay | Admitting: *Deleted

## 2018-09-16 MED ORDER — CIPROFLOXACIN HCL 500 MG PO TABS: 500.0000 mg | ORAL_TABLET | Freq: Two times a day (BID) | ORAL | 0 refills | Status: DC

## 2018-09-16 NOTE — Telephone Encounter (Signed)
Called the [facility] assisted living to staff there to let them know that Ms. Melissa Combs has a UTI.  Dr. Janese Banks would like patient to start on Cipro 500 mg twice a day for 5 days.  Prescription was sent into new pharmacy per the people at the family care home.  I also want to order for the Cipro so they can have it for the records and we will fax it to [phone]

## 2018-09-17 LAB — URINE CULTURE: Culture: >=100000 — AB

## 2018-09-19 NOTE — Progress Notes (Signed)
Hematology/Oncology Consult note [facility]  [phone] [phone]  Patient Care Team: [name], MD as PCP - General (Family Medicine) Mohammed Kindle, MD as Attending Physician (Pain Medicine) Anabel Bene, MD as Referring Physician (Neurology)   Name of the patient: Melissa Combs  [id number]  Feb 01, 1930   Date of visit: 09/19/18  Diagnosis-  invasive mammary carcinoma of the left breast clinical prognostic stage IIAT3 N0 M0 ER 90% positive PR 1% positive HER-2/neu negative   Chief complaint/ Reason for visit-routine follow-up of breast cancer  Heme/Onc history: patient is a 85 year old female with a past medical history significant for dementia, hypertension, hyperlipidemia and chronic kidney disease among other medical problems. She has been a resident of group home since 2015 due to her gradually worsening dementia. Her only close family currently is her daughter who lives in [place] and is with her today along with her caregiver from group home. With regards to her dementia patient is able to ambulate with the help of a walker. She can carry out conversations and is not incontinent of her urine and stool. She is able to eat without any aspiration issues and overall she has been doing well at the nursing home without any recent hospitalizations. Her caregiver noticed a left breast mass recently which prompted a mammogram. Mammogram showed a 5.1 cm mass in the 2 o'clock position of the left breast. No evidence of axillary adenopathy. No evidence of breast masses in the right breast. Patient has had a prior breast biopsy and surgery in the 1970s which the daughter sayswas not malignancy. No other family history of breast cancer, ovarian cancer, colon or prostate cancer. There is a family history of malignant hyperthermia with nitrous oxide.  Patient is currently pleasantly confused. She is not aware about her  underlying medical condition and history is obtained as above with the help of her daughter and caregiver. Overall her appetite is good and she has not had any unintentional weight loss. She does not complain of any pain today  Patient underwent core biopsy which showed invasive mammary carcinoma, grade 2, ER greater than 90% positive, PR 1% positive and HER-2 equivocal by IHC.FISH negative  Patient was seen by Dr. Dahlia Byes from surgery and given family history of malignant hyperthermia along with patient age and dementia she was not deemed to be a surgical candidate. arimidex started on 04/14/18  Interval history-she is here with her caregiver from nursing home.  Caregiver reports she is tolerating her Arimidex well and has not voiced any physical complaints.  She does note that her behavior has been a little different as compared to her baseline and is wondering if she is coming down with a urinary tract infection.  Patient herself has not voiced any concerns of burning when she passes urine.  ECOG PS- 2 Pain scale- 0   Review of systems- Review of Systems  Unable to perform ROS: Dementia       No Known Allergies   Past Medical History:  Diagnosis Date  . Allergy   . Anemia   . Arthritis   . B12 deficiency 11/06/2015   Less than 400 October 2016  . Chronic constipation   . Dementia ([place])   . Dementia ([place])   . Family history of adverse reaction to anesthesia    brother, daughter and grandson had malignant hyperthermia in the past  . Glaucoma   . History of fibrocystic disease of breast  biopsy done, negative  . History of hand fracture    left  . History of shingles 2014  . History of wrist fracture    left  . Hypertension   . Impacted cerumen   . Malignant hyperthermia   . Pneumonia   . Postherpetic neuralgia   . TIA (transient ischemic attack) Feb 2016     Past Surgical History:  Procedure Laterality Date  . BREAST BIOPSY Left 03/24/2018   path pending  .  BREAST LUMPECTOMY     unsure which side  . CATARACT EXTRACTION W/PHACO Right 01/12/2017   Procedure: CATARACT EXTRACTION PHACO AND INTRAOCULAR LENS PLACEMENT (IOC);  Surgeon: Birder Robson, MD;  Location: [facility];  Service: Ophthalmology;  Laterality: Right;  [place] 1:05.8 AP% 26.0 CDE 17.07 Fluid pack lot # [id number]  . ESOPHAGOGASTRODUODENOSCOPY (EGD) WITH PROPOFOL N/A 02/10/2018   Procedure: ESOPHAGOGASTRODUODENOSCOPY (EGD) WITH PROPOFOL;  Surgeon: Lin Landsman, MD;  Location: [facility];  Service: Gastroenterology;  Laterality: N/A;    Social History   Socioeconomic History  . Marital status: Single    Spouse name: Not on file  . Number of children: Not on file  . Years of education: Not on file  . Highest education level: Not on file  Occupational History  . Not on file  Social Needs  . Financial resource strain: Not on file  . Food insecurity:    Worry: Not on file    Inability: Not on file  . Transportation needs:    Medical: Not on file    Non-medical: Not on file  Tobacco Use  . Smoking status: Never Smoker  . Smokeless tobacco: Never Used  Substance and Sexual Activity  . Alcohol use: No    Alcohol/week: 0.0 standard drinks  . Drug use: No  . Sexual activity: Never  Lifestyle  . Physical activity:    Days per week: Not on file    Minutes per session: Not on file  . Stress: Not on file  Relationships  . Social connections:    Talks on phone: Not on file    Gets together: Not on file    Attends religious service: Not on file    Active member of club or organization: Not on file    Attends meetings of clubs or organizations: Not on file    Relationship status: Not on file  . Intimate partner violence:    Fear of current or ex partner: Not on file    Emotionally abused: Not on file    Physically abused: Not on file    Forced sexual activity: Not on file  Other Topics Concern  . Not on file  Social History Narrative  . Not on file    Family  History  Family history unknown: Yes     Current Outpatient Medications:  .  acetaminophen (TYLENOL 8 HOUR ARTHRITIS PAIN) 650 MG CR tablet, Take 650 mg by mouth 3 (three) times daily as needed. , Disp: , Rfl:  .  amLODipine (NORVASC) 5 MG tablet, TAKE ONE TABLET BY MOUTH EVERY DAY, Disp: 30 tablet, Rfl: 5 .  anastrozole (ARIMIDEX) 1 MG tablet, Take 1 tablet (1 mg total) by mouth daily., Disp: 30 tablet, Rfl: 3 .  ASPIRIN ADULT LOW STRENGTH 81 MG EC tablet, TAKE ONE TABLET BY MOUTH EVERY DAY *DO NOT CRUSH*, Disp: 30 tablet, Rfl: 5 .  Bismuth Subsalicylate 387 FI/43PI SUSP, Take 30 mLs (1,050 mg total) by mouth 2 (two) times daily as needed., Disp: ,  Rfl:  .  brimonidine-timolol (COMBIGAN) 0.2-0.5 % ophthalmic solution, Place 1 drop into both eyes 2 (two) times daily., Disp: , Rfl:  .  Calcium Carb-Cholecalciferol (CALCIUM-VITAMIN D) 600-400 MG-UNIT TABS, Take 1 tablet by mouth 2 (two) times daily., Disp: 60 tablet, Rfl: 3 .  carvedilol (COREG) 6.25 MG tablet, TAKE ONE TABLET BY MOUTH TWO TIMES A DAY, Disp: 60 tablet, Rfl: 5 .  citalopram (CELEXA) 10 MG tablet, TAKE ONE TABLET BY MOUTH EVERY DAY, Disp: 30 tablet, Rfl: 5 .  Diapers & Supplies (HUGGIES PULL-UPS) MISC, 1 each by Does not apply route 3 (three) times daily. Adult size medium, urinary and stool incontinence, LON 99 months, Disp: 100 each, Rfl: 5 .  divalproex (DEPAKOTE SPRINKLES) 125 MG capsule, Take 125 mg by mouth 3 (three) times daily. , Disp: , Rfl:  .  divalproex (DEPAKOTE) 125 MG DR tablet, Take 125 mg by mouth daily as needed. May take additional 4th tablet as needed to go along with 3 times daily order., Disp: , Rfl:  .  donepezil (ARICEPT) 10 MG tablet, Take 1 tablet (10 mg total) by mouth at bedtime., Disp: 30 tablet, Rfl: 11 .  Elastic Bandages & Supports (MEDICAL COMPRESSION STOCKINGS) MISC, Put stockings on every morning and remove every night; do not sleep in stockings; get the kind with open toes, Disp: 2 each, Rfl: 0 .   fluticasone (FLONASE) 50 MCG/ACT nasal spray, USE 2 SPRAYS IN BOTH NOSTRILS ONCE DAILYFOR RHINITIS, Disp: 16 g, Rfl: 11 .  latanoprost (XALATAN) 0.005 % ophthalmic solution, Place 1 drop into both eyes at bedtime., Disp: , Rfl:  .  lisinopril (PRINIVIL,ZESTRIL) 20 MG tablet, TAKE ONE TABLET BY MOUTH EVERY DAY, Disp: 30 tablet, Rfl: 5 .  montelukast (SINGULAIR) 5 MG chewable tablet, CHEW 1 TABLET BY MOUTH AT BEDTIME, Disp: 30 tablet, Rfl: 5 .  NAMENDA XR 28 MG CP24 24 hr capsule, TAKE 1 CAPSULE BY MOUTH ONCE DAILY FOR MEMORY, Disp: 30 capsule, Rfl: 11 .  omeprazole (PRILOSEC) 20 MG capsule, Take 1 capsule (20 mg total) by mouth daily., Disp: 30 capsule, Rfl: 3 .  polyethylene glycol powder (GLYCOLAX/MIRALAX) powder, MIX 17 GR (1 CAPFUL) IN 8 OZ WATER/JUICEAND DRINK ONCE DAILY AS NEEDED TO FOR CONSTIPATION, Disp: 527 g, Rfl: 11 .  pravastatin (PRAVACHOL) 40 MG tablet, Take 1 tablet (40 mg total) by mouth daily., Disp: 90 tablet, Rfl: 3 .  pregabalin (LYRICA) 25 MG capsule, 25 mg qhs every other night, Disp: 30 capsule, Rfl: 2 .  vitamin B-12 (CYANOCOBALAMIN) 500 MCG tablet, TAKE ONE TABLET BY MOUTH EVERY DAY, Disp: 30 tablet, Rfl: 5 .  ciprofloxacin (CIPRO) 500 MG tablet, Take 1 tablet (500 mg total) by mouth 2 (two) times daily., Disp: 10 tablet, Rfl: 0  Physical exam:  Vitals:   09/15/18 1034  BP: (!) 173/74  Pulse: (!) 54  Temp: (!) 95.8 F (35.4 C)  TempSrc: Tympanic  Weight: 127 lb (57.6 kg)   Physical Exam Constitutional:      Comments: Elderly female sitting in a wheelchair.  Appears in no acute distress  HENT:     Head: Normocephalic and atraumatic.  Eyes:     Pupils: Pupils are equal, round, and reactive to light.  Neck:     Musculoskeletal: Normal range of motion.  Cardiovascular:     Rate and Rhythm: Normal rate and regular rhythm.     Heart sounds: Normal heart sounds.  Pulmonary:     Effort: Pulmonary effort is normal.  Breath sounds: Normal breath sounds.    Abdominal:     General: Bowel sounds are normal.     Palpations: Abdomen is soft.  Skin:    General: Skin is warm and dry.  Neurological:     General: No focal deficit present.     Mental Status: She is alert.    There is a large palpable about 10 cm mass in her left breast which is unchanged from before.  There is some puckering of the overlying areola.  No nipple discharge.  No superficial ulcerations.  No palpable left axillary adenopathy CMP Latest Ref Rng & Units 09/15/2018  Glucose 70 - 99 mg/dL 88  BUN 8 - 23 mg/dL 14  Creatinine 0.44 - 1.00 mg/dL 0.72  Sodium 135 - 145 mmol/L 138  Potassium 3.5 - 5.1 mmol/L 3.7  Chloride 98 - 111 mmol/L 98  CO2 22 - 32 mmol/L 33(H)  Calcium 8.9 - 10.3 mg/dL 9.2  Total Protein 6.5 - 8.1 g/dL 7.7  Total Bilirubin 0.3 - 1.2 mg/dL 0.6  Alkaline Phos 38 - 126 U/L 45  AST 15 - 41 U/L 19  ALT 0 - 44 U/L 6   CBC Latest Ref Rng & Units 05/27/2018  WBC 3.6 - 11.0 K/uL 8.3  Hemoglobin 12.0 - 16.0 g/dL 10.7(L)  Hematocrit 35.0 - 47.0 % 32.7(L)  Platelets 150 - 440 K/uL 193      Assessment and plan- Patient is a 85 y.o. female  invasive mammary carcinoma of the left breast clinically prognostic stage II aT3 N0 M0 ER 90% positive, PR 1% positive and HER-2/neu negative.  She was not deemed to be a surgical candidate and is currently on Arimidex.  She is here for routine follow-up of her breast cancer  Patient will continue Arimidex with calcium and vitamin D at this time.  We will keep a close eye on her breast mass and if there is any concerns from her caregiver that the breast mass is involving her overlying skin we may have to consider palliative radiation since she is not a surgical candidate.  However it is hard for the patient to go through daily radiation as well given her underlying dementia.  Patient's caregiver reports some behavioral changes which she has had before when she comes down with urinary tract infection.  We therefore checked a  urinalysis which was concerning for UTI and urine culture shows Klebsiella.  We have prescribed her ciprofloxacin 500 mg twice daily for 7 days.   Visit Diagnosis 1. Malignant neoplasm of left breast in female, estrogen receptor negative, unspecified site of breast ([place])   2. Dysuria   3. Acute cystitis without hematuria   4. Visit for monitoring Arimidex therapy      Dr. Randa Evens, MD, MPH [facility] [phone] 09/19/2018 9:39 AM

## 2018-09-20 ENCOUNTER — Telehealth: Payer: Self-pay | Admitting: Family Medicine

## 2018-09-20 NOTE — Telephone Encounter (Signed)
[facility] not filling prescriptions for [facility].

## 2018-09-20 NOTE — Telephone Encounter (Signed)
Copied from [place] [phone]. Topic: Quick Communication - Rx Refill/Question >> Sep 20, 2018 11:27 AM Margot Ables wrote: Medication: diclofenac sodium (VOLTAREN) 1 % GEL - pt is out of gel - Joy with [facility] states she needs new RX  Has the patient contacted their pharmacy? no Preferred Pharmacy (with phone number or street name): [place], [place] - [place] [phone] (Phone) [phone] (Fax)

## 2018-09-21 MED ORDER — DICLOFENAC SODIUM 1 % TD GEL: TRANSDERMAL | 5 refills | Status: DC

## 2018-09-21 NOTE — Telephone Encounter (Signed)
Rx sent 

## 2018-09-21 NOTE — Addendum Note (Signed)
Addended by: LADA, Satira Anis on: 09/21/2018 12:10 PM   Modules accepted: Orders

## 2018-09-25 IMAGING — CT CT HEAD W/O CM
3 series · 15 of 45 positions shown, 18 images · non-contrast
Comparison: None.

CLINICAL DATA: Pt very poor historian. Per note in chart "Per [facility]
complaint:R leg pain from: [REDACTED] pt woke up limited
use not able to stand.Pt was here yesterday. History of dementia.

EXAM:
CT HEAD WITHOUT CONTRAST
TECHNIQUE: Contiguous axial images were obtained from the base of the skull
through the vertex without intravenous contrast.

[Series 2: head wo · axial · 0.47mm/px · z∈[-142,-27]mm · 9 of 28 slices shown, 12 images]
[im 3/28  brain]
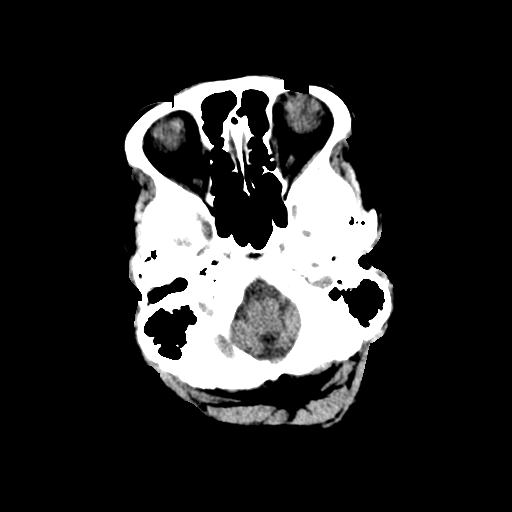
[im 3/28  bone]
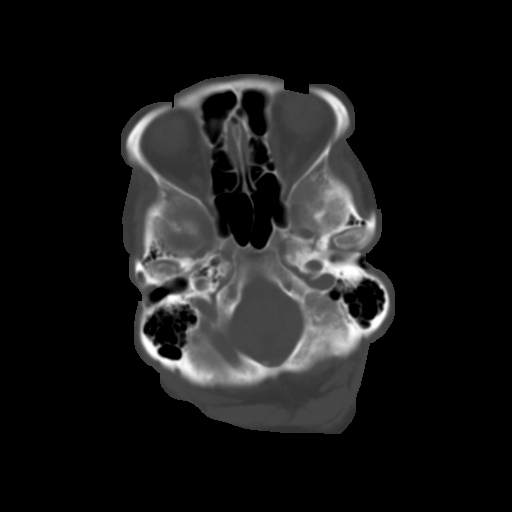
[im 6/28  brain]
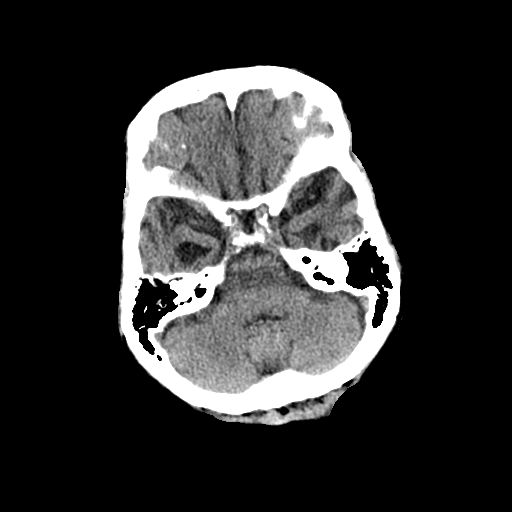
[im 9/28  brain]
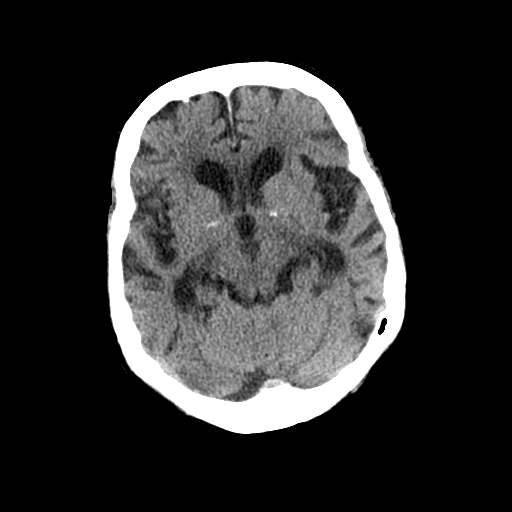
[im 12/28  brain]
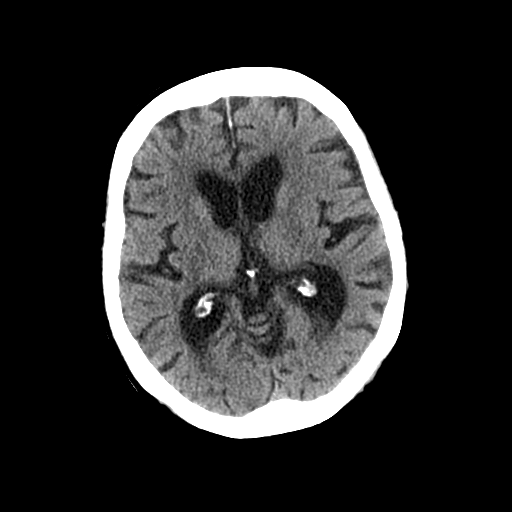
[im 15/28  brain]
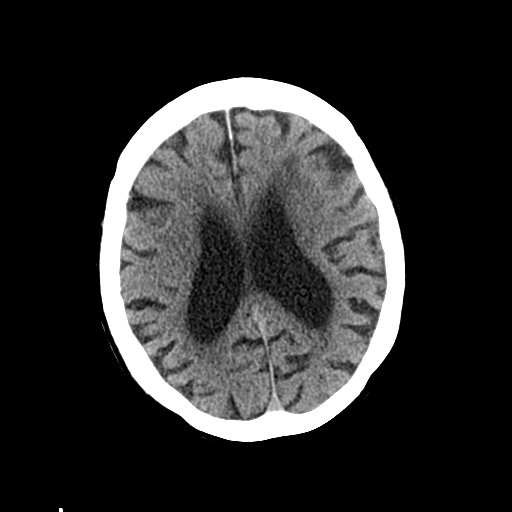
[im 15/28  bone]
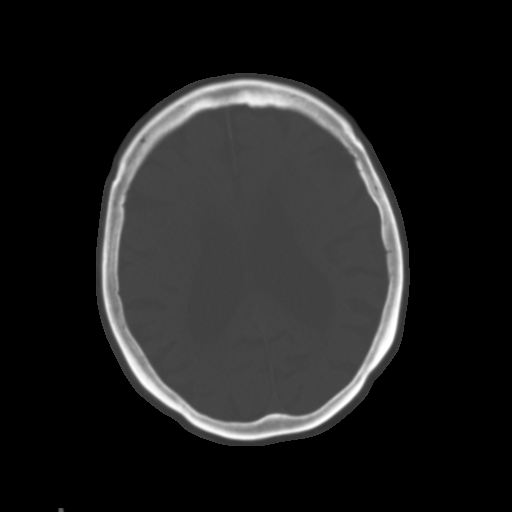
[im 17/28  brain]
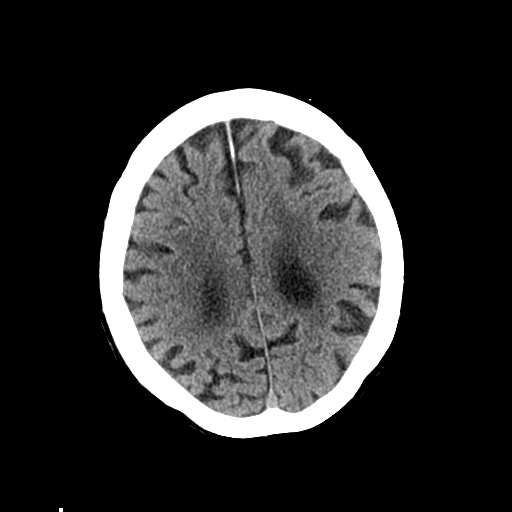
[im 20/28  brain]
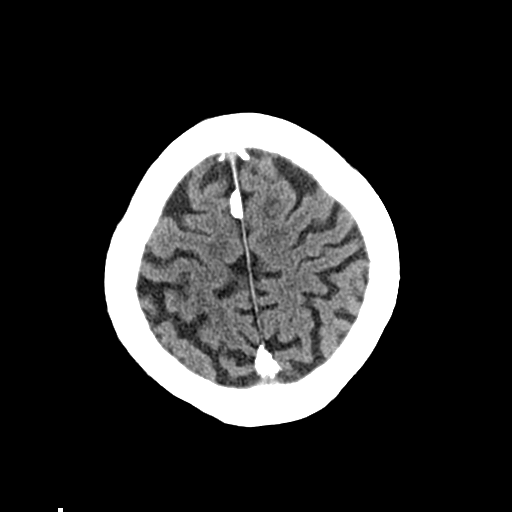
[im 23/28  brain]
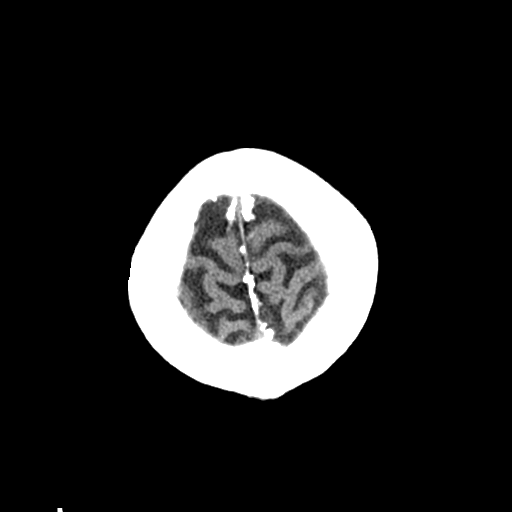
[im 26/28  brain]
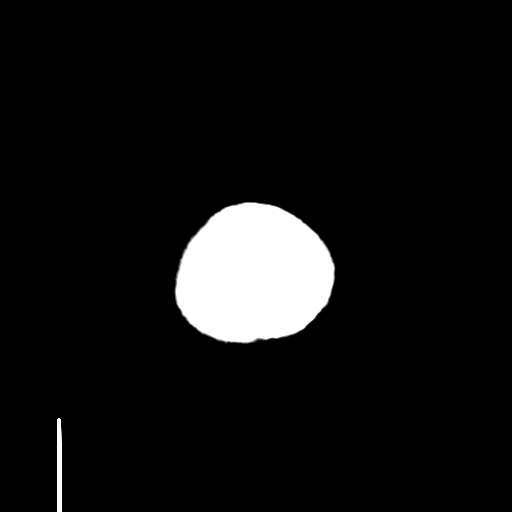
[im 26/28  bone]
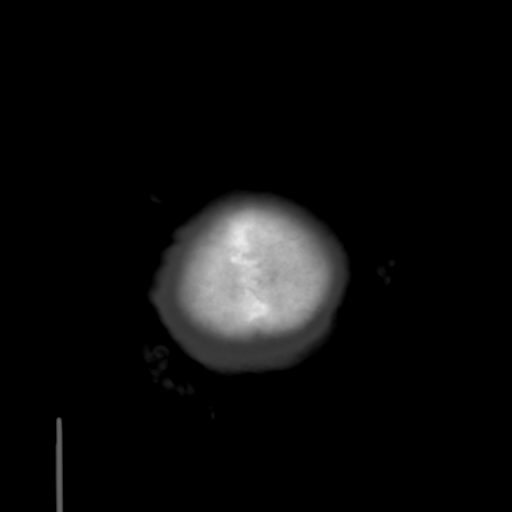

[Series 4: coronal soft tissue · coronal · 0.27mm/px · 3 of 65 slices shown]
[im 22/65  brain]
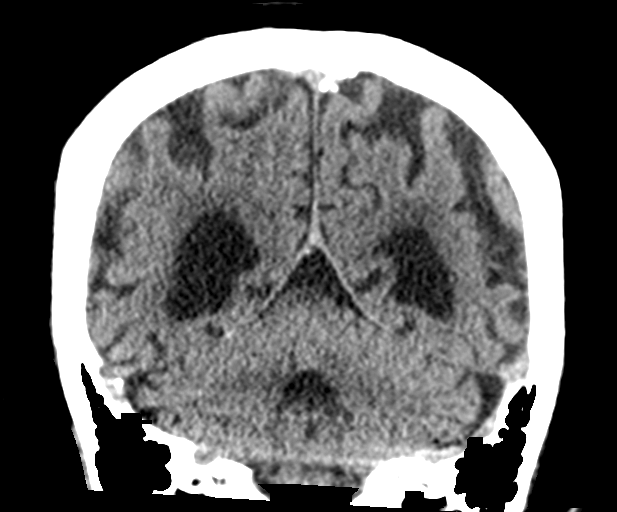
[im 29/65  brain]
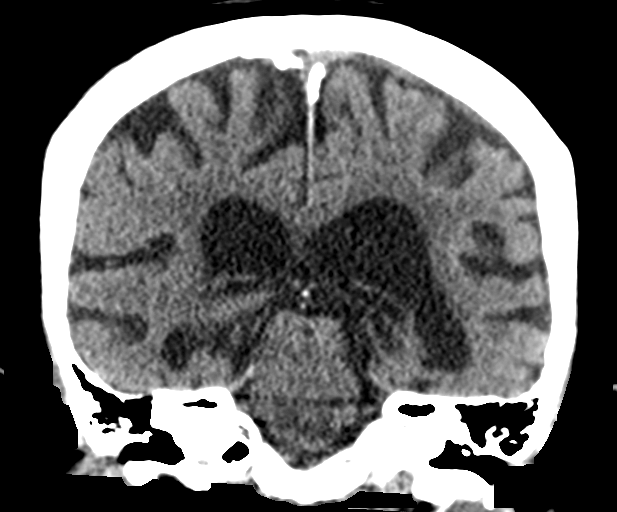
[im 36/65  brain]
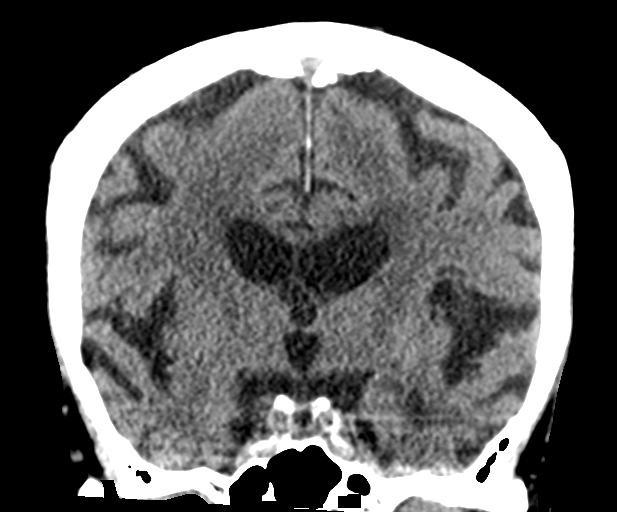

[Series 5: sagittal soft tissue · sagittal · 0.27mm/px · 3 of 55 slices shown]
[im 19/55  brain]
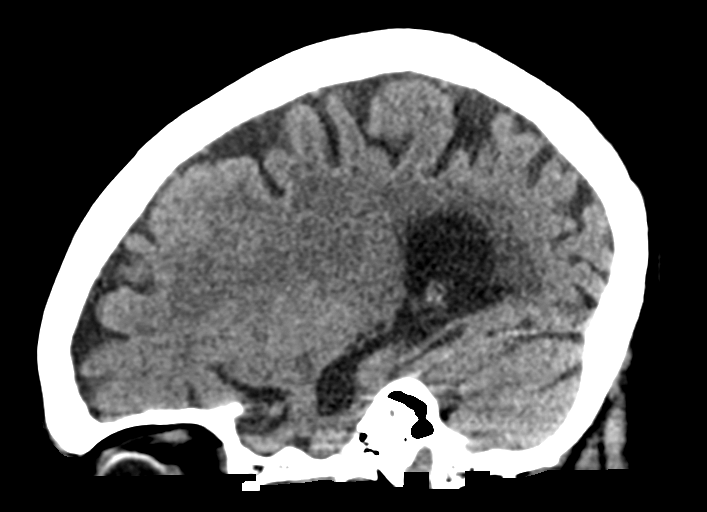
[im 28/55  brain]
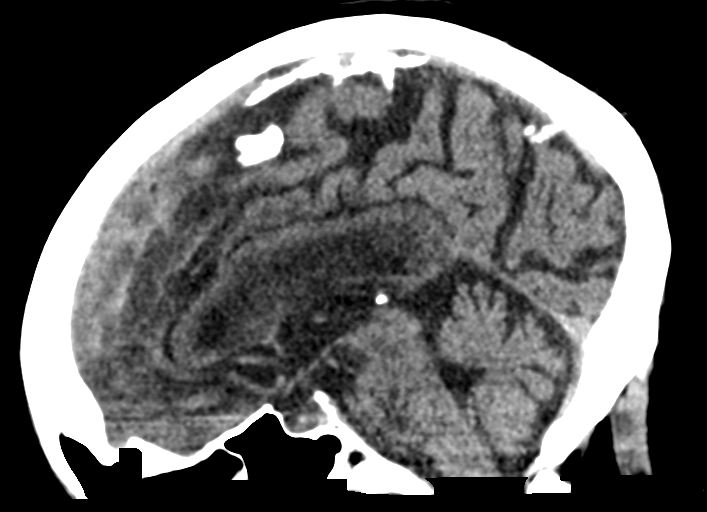
[im 37/55  brain]
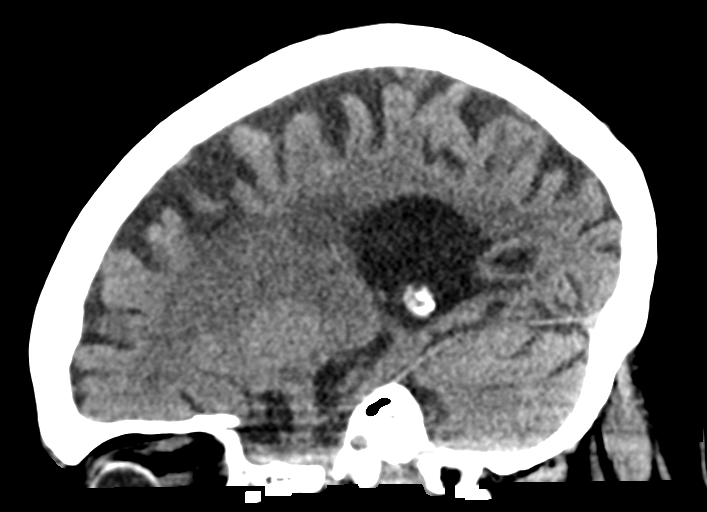

[15 of 45 positions shown; findings below may reference images not displayed]

FINDINGS: Brain: There is significant central and cortical atrophy.
Periventricular white matter changes are consistent with small
vessel disease. There is encephalomalacia in the LEFT frontal lobe
consistent with remote infarct. There is no intra or extra-axial
fluid collection or mass lesion. The basilar cisterns and ventricles
have a normal appearance. There is no CT evidence for acute
infarction or hemorrhage.

Vascular: There is atherosclerotic calcification of the internal
carotid arteries.

Skull: Normal. Negative for fracture or focal lesion.

Sinuses/Orbits: No acute finding.

Other: None.
IMPRESSION: 1.  No evidence for acute intracranial abnormality.
2. Atrophy and small vessel disease.
3. LEFT frontal lobe encephalomalacia consistent with remote
infarct.

## 2018-09-25 IMAGING — CR DG HIP (WITH OR WITHOUT PELVIS) 2-3V*R*
1 series · 3 of 3 positions shown · non-contrast
Comparison: None.

CLINICAL DATA: Per physician notes - Pt was sent in here allegedly
because her leg hurts. She denies that her leg hurts. I called the
nursing facility where she states, they stated that she seemed
unable to walk using that leg as well as normal today for reasons
unknown. Unclear time of onset.

EXAM:
DG HIP (WITH OR WITHOUT PELVIS) 2-3V RIGHT

[Series 1: dg hip unilat w or w/o pelvis 2-3 views  · non-contrast · 0.14mm/px · 3 of 3 slices shown]
[im 1/3]
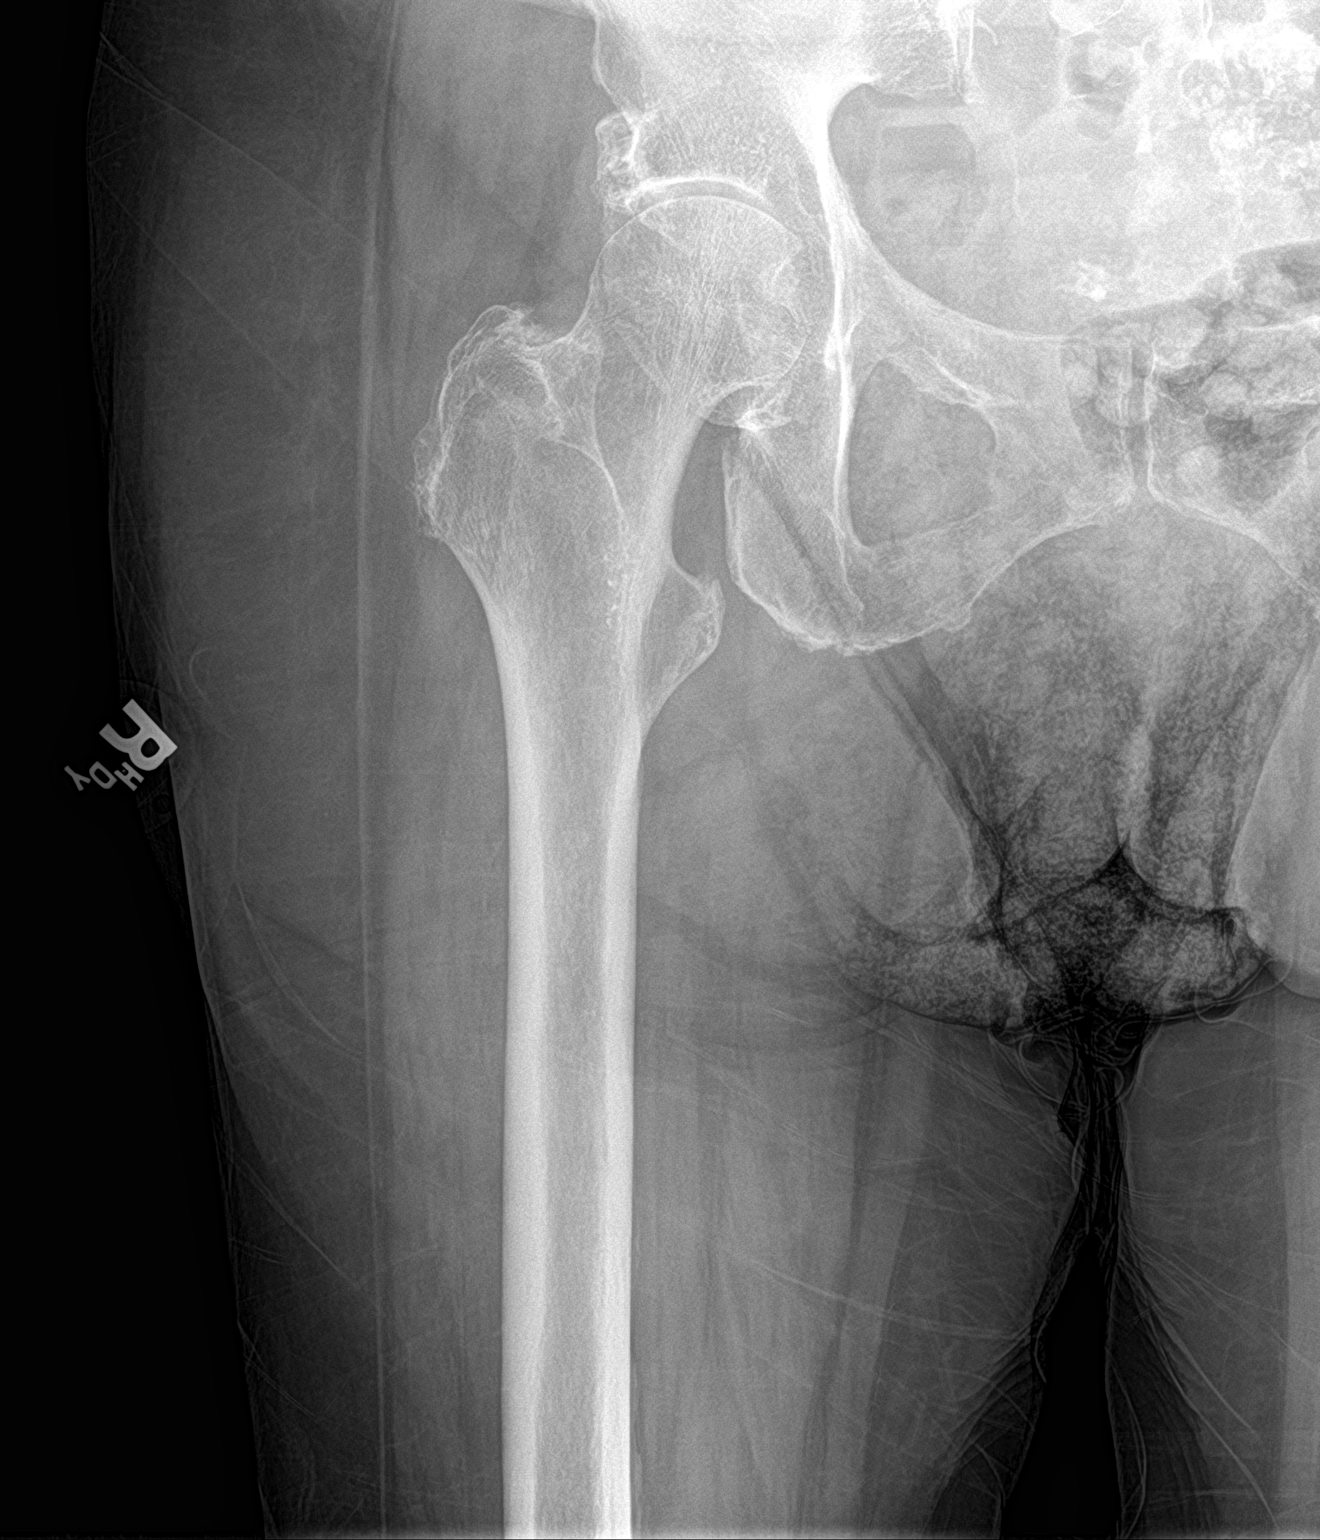
[im 2/3]
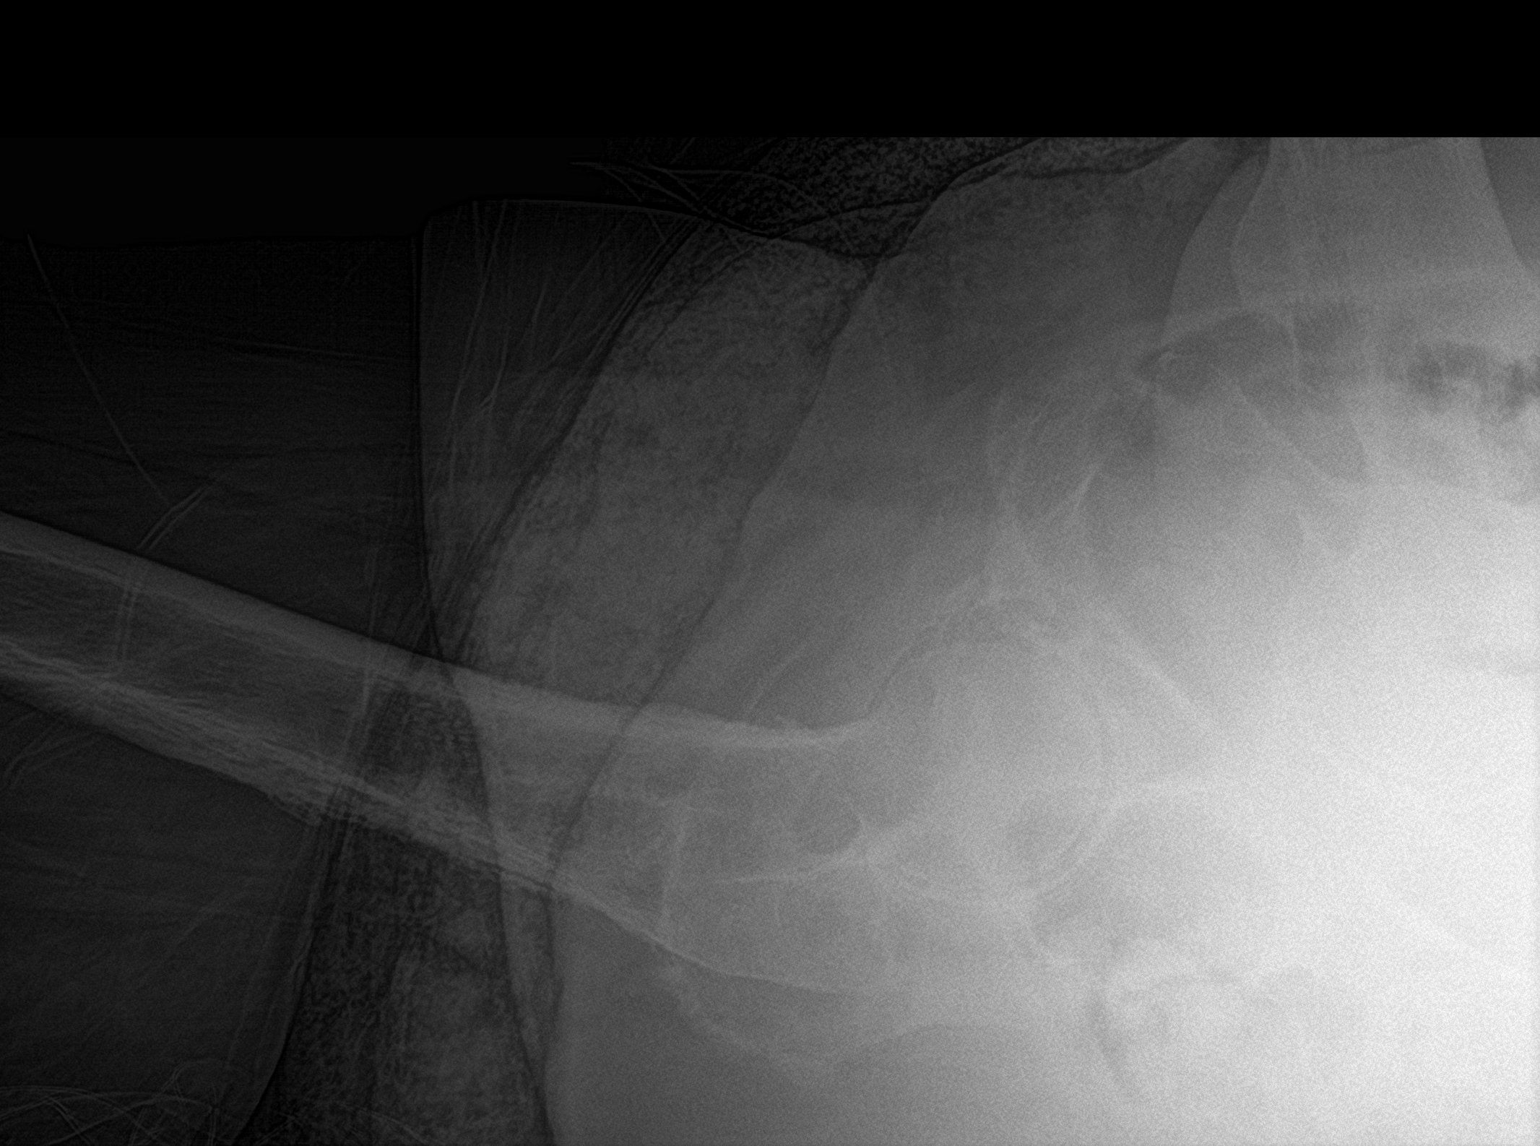
[im 3/3]
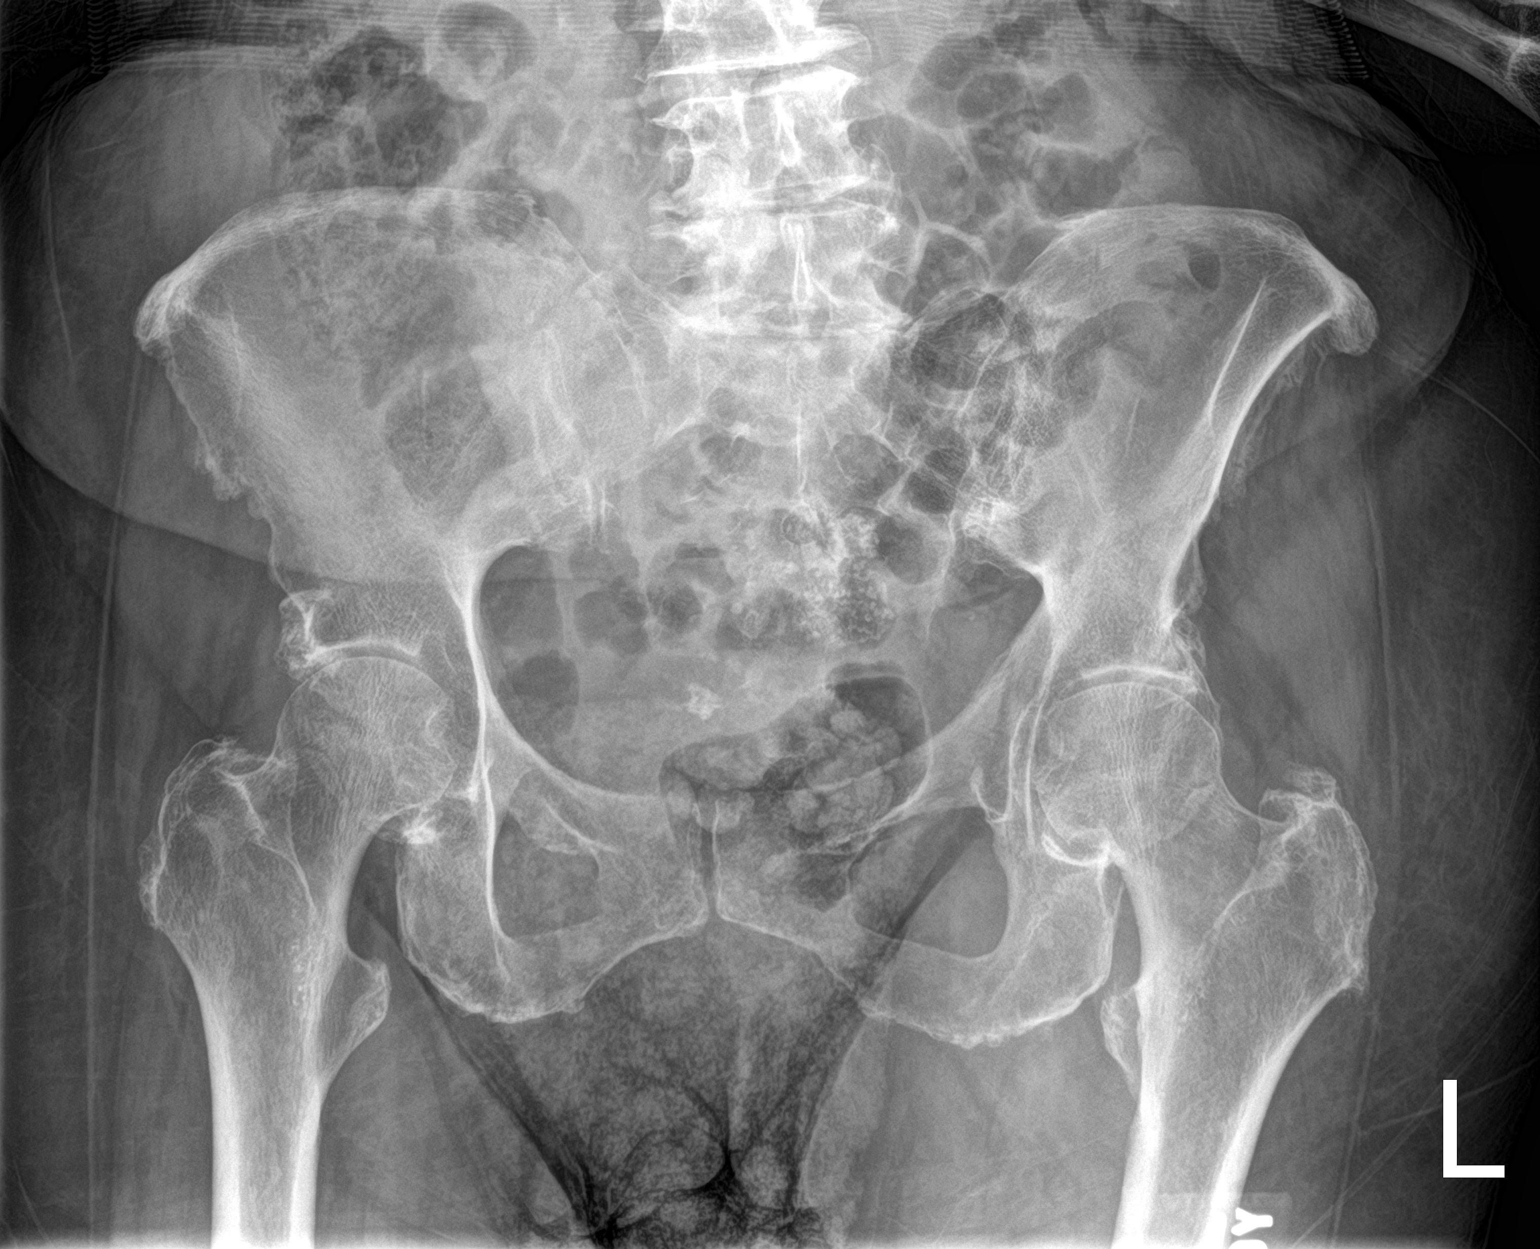

[3 of 3 positions shown; findings below may reference images not displayed]

FINDINGS: No fracture.  No bone lesion.

Hip joints are normally aligned as are the SI joints and symphysis
pubis.

Bones are diffusely demineralized.

There are disc degenerative changes of the lower lumbar spine.

Soft tissues are unremarkable.
IMPRESSION: No fracture or dislocation.

## 2018-10-26 DIAGNOSIS — G459 Transient cerebral ischemic attack, unspecified: Secondary | ICD-10-CM | POA: Diagnosis not present

## 2018-10-26 DIAGNOSIS — F0390 Unspecified dementia without behavioral disturbance: Secondary | ICD-10-CM | POA: Diagnosis not present

## 2018-10-29 ENCOUNTER — Other Ambulatory Visit: Payer: Self-pay | Admitting: Family Medicine

## 2018-10-29 DIAGNOSIS — B0229 Other postherpetic nervous system involvement: Secondary | ICD-10-CM

## 2018-10-30 NOTE — Telephone Encounter (Signed)
Lyrica is prescribed by the pain clinic

## 2018-11-01 NOTE — Telephone Encounter (Signed)
Care giver notified.

## 2018-11-01 NOTE — Telephone Encounter (Signed)
Please let her or her caregiver know that this needs to come from the pain clinic; thank you

## 2018-11-01 NOTE — Telephone Encounter (Signed)
This was routed back to me; please see my previous response

## 2018-11-02 MED ORDER — PREGABALIN 25 MG PO CAPS: ORAL_CAPSULE | ORAL | 2 refills | Status: DC

## 2018-11-02 NOTE — Addendum Note (Signed)
Addended by: Docia Furl on: 11/02/2018 12:03 PM   Modules accepted: Orders

## 2018-11-02 NOTE — Telephone Encounter (Signed)
Rx sent 

## 2018-11-02 NOTE — Telephone Encounter (Signed)
Joy needs the pregabalin (LYRICA) 25 MG capsule to be sent to [place]. Patient was discharged from pain clinic. Takes this every other night for knee pain.

## 2018-11-02 NOTE — Addendum Note (Signed)
Addended by: Kyron Schlitt, Satira Anis on: 11/02/2018 01:09 PM   Modules accepted: Orders

## 2018-11-15 ENCOUNTER — Other Ambulatory Visit: Payer: Self-pay | Admitting: Family Medicine

## 2018-11-15 NOTE — Telephone Encounter (Signed)
I do not prescribe Anastrozone (Arimidex) I do not prescribe her glaucoma medicine  I personally called the pharmacy; they don't need any of these refills; they suspect a computer glitch; she has plenty I let the pharmacy know that I'm stopping her singulair, discontinue   Melissa Combs Please just let group home know that I am STOPPING her singulair due to her age and risks; pharmacy already has the order; let us know if allergies get worse  Fax below if needed:  Discontinue montelukast as of 11/15/2018  Medications Discontinued During This Encounter  Medication Reason  . montelukast (SINGULAIR) 5 MG chewable tablet Discontinued by provider   Thank you, Loma Sousa, M.D.

## 2018-11-15 NOTE — Telephone Encounter (Signed)
Caregiver notified. Order faxed to d/c montelukast.

## 2018-12-01 ENCOUNTER — Ambulatory Visit (INDEPENDENT_AMBULATORY_CARE_PROVIDER_SITE_OTHER): Payer: Medicare Other

## 2018-12-01 DIAGNOSIS — R8290 Unspecified abnormal findings in urine: Secondary | ICD-10-CM

## 2018-12-01 DIAGNOSIS — R4182 Altered mental status, unspecified: Secondary | ICD-10-CM | POA: Diagnosis not present

## 2018-12-01 MED ORDER — SULFAMETHOXAZOLE-TRIMETHOPRIM 800-160 MG PO TABS: 1.0000 | ORAL_TABLET | Freq: Two times a day (BID) | ORAL | 0 refills | Status: DC

## 2018-12-01 NOTE — Telephone Encounter (Signed)
Pt caretaker Caryl Asp called to say that Melissa Combs was not acting like herself since Tuesday.  She states that she is not drinking well and is seeing things that are not there.  Joy states that she believes the patient has a UTI and would like to bring a urine specimen to the office. She states the patient has no fever and is eating well just not wanting to take her medications and talking to people that are not there. Pt has HX of dementia. Per office protocol. Call transferred to office for scheduling.   Reason for Disposition . [1] Longstanding confusion (e.g., dementia, stroke) AND [2] worsening  Answer Assessment - Initial Assessment Questions 1. LEVEL OF CONSCIOUSNESS: "How is he (she, the patient) acting right now?" (e.g., alert-oriented, confused, lethargic, stuporous, comatose)     Seeing tings that are not there not drinking 2. ONSET: "When did the confusion start?"  (minutes, hours, days)    Tuesday 3. PATTERN "Does this come and go, or has it been constant since it started?"  "Is it present now?"     In and out but present now 4. ALCOHOL or DRUGS: "Has he been drinking alcohol or taking any drugs?"      No 5. NARCOTIC MEDICATIONS: "Has he been receiving any narcotic medications?" (e.g., morphine, Vicodin)     no 6. CAUSE: "What do you think is causing the confusion?"      UTI urine smells 7. OTHER SYMPTOMS: "Are there any other symptoms?" (e.g., difficulty breathing, headache, fever, weakness)     No other symptom  Protocols used: CONFUSION - DELIRIUM-A-AH

## 2018-12-01 NOTE — Addendum Note (Signed)
Addended by: Jazelyn Sipe, Satira Anis on: 12/01/2018 03:48 PM   Modules accepted: Orders

## 2018-12-01 NOTE — Telephone Encounter (Signed)
Yes, please tell her: 1.  By all means get a clean catch specimen ASAP and I'll start antibiotics while we await the results and culture 2. If her condition worsens, take her to the ER 3. Urosepsis can be dangerous and even deadly, especially in the elderly 4. Start antibiotics right away and take her to ER if getting worse

## 2018-12-02 DIAGNOSIS — R4182 Altered mental status, unspecified: Secondary | ICD-10-CM | POA: Diagnosis not present

## 2018-12-02 DIAGNOSIS — R8290 Unspecified abnormal findings in urine: Secondary | ICD-10-CM | POA: Diagnosis not present

## 2018-12-02 LAB — POCT URINALYSIS DIPSTICK
Bilirubin, UA: NEGATIVE
Glucose, UA: NEGATIVE
Ketones, UA: NEGATIVE
Nitrite, UA: NEGATIVE
Protein, UA: NEGATIVE
Spec Grav, UA: 1.015 (ref 1.010–1.025)
Urobilinogen, UA: NEGATIVE E.U./dL — AB
pH, UA: 5 (ref 5.0–8.0)

## 2018-12-02 NOTE — Addendum Note (Signed)
Addended by: Sibyl Parr on: 12/02/2018 09:32 AM   Modules accepted: Orders

## 2018-12-02 NOTE — Telephone Encounter (Signed)
Caregivers notified to start antiobiotic per [facility] yesterday. Urine dip entered into chart. Micro and culture ordered and given to lab.

## 2018-12-02 NOTE — Addendum Note (Signed)
Addended by: Sibyl Parr on: 12/02/2018 11:25 AM   Modules accepted: Orders

## 2018-12-02 NOTE — Addendum Note (Signed)
Addended by: Sibyl Parr on: 12/02/2018 09:36 AM   Modules accepted: Orders

## 2018-12-05 ENCOUNTER — Telehealth: Payer: Self-pay | Admitting: Family Medicine

## 2018-12-05 ENCOUNTER — Telehealth: Payer: Self-pay | Admitting: Nurse Practitioner

## 2018-12-05 LAB — URINALYSIS, MICROSCOPIC ONLY: Hyaline Cast: NONE SEEN /LPF

## 2018-12-05 LAB — URINE CULTURE
MICRO NUMBER:: 374040
SPECIMEN QUALITY:: ADEQUATE

## 2018-12-05 NOTE — Telephone Encounter (Signed)
Computer failed... Lot Sleeping more not new She has cancer, advanced age, health concerns This is the daughter's call She does not want her mother to pick something up She is full code right now I appreciate the daughter's concern, refusing ER transport, fears of COVID-19 transmission Malignant hyperthermia runs in the family, she told the surgeon she does want to be cut, declined mastectomy Daughter decided on cancer pill rather than surgery Does not want her mother to go to the ER, would not want it on her conscience or her mother's conscience if she were to pick something up (COVID-19) and bring it back to the facility; she knows the other residents and workers in the home and does not want any one to get sick  We agreed, since patient sounds to be feeling better right now, to continue current antibiotics I'll ask staff to check on her tomorrow (April 7th); if continuing to get better, we'll continue current antibiotics and watch mer If she is getting worse, daughter is open to trying another oral antibiotic to try that rather than ER unless absolutely necessary  I called Jocelyn, gave him update about the plan They're trying to see if depakote is the cause, now BID (down from TID) per Dr. Lannie Fields recommendations; I did explain always follow his directions and don't stop cold [place] b/c of risk of seizures if held completely (unless that's his direction)  --------------------------------------------------------  Suezanne Cheshire, DNP: I am out of the office tomorrow Can you please check on patient on Tuesday April 7th and see how she's doing; switch antibiotics (consult culture results) if declining If she's continuing to get better, we'll stay the course with current antibiotics Daughter really does NOT want patient to go to the ER due to COVID-19 pandemic unless it is absolutely absolutely necessary Thank you

## 2018-12-05 NOTE — Telephone Encounter (Signed)
Yes, but I don't see the urinalysis or culture that were also ordered. It was supposed to be for an in-house POCT dip with the urine micro and culture to be sent off. Where are those?  Please let facility staff know: If she is not doing well on antibiotics, she needs to go to urgent care or the ER NOW. She could be developing sepsis which can be fatal in elderly people especially.

## 2018-12-05 NOTE — Telephone Encounter (Signed)
Lab printed results I dont know why they did not cross over? Do you want me to have elizabeth review or still send her to ER?

## 2018-12-05 NOTE — Telephone Encounter (Signed)
Dr. Sanda Klein- I have signed the urine culture so it can be scanned in for your review. It shows Klebsiella Pneumonia- it is sensitive to bactrim which you prescribed and most sensitive to cipro.

## 2018-12-05 NOTE — Telephone Encounter (Signed)
Facility notified.

## 2018-12-05 NOTE — Telephone Encounter (Signed)
With her mental status issues, I still think she needs to be evaluated in the ER; she may be septic; they likely need BP and CBC with diff and blood cultures; thank you If someone can scan those labs in, I'll look at them today; maybe EP can initial them if needed for scanning

## 2018-12-05 NOTE — Telephone Encounter (Signed)
Benjamine Mola signed and miel will scan

## 2018-12-05 NOTE — Telephone Encounter (Signed)
The Daughter [place] called in and stated that you may return the call but she has spoken with the home and told them to NOT take her to the ER that the situation does not warrant until she speaks to Dr Sanda Klein.  She may be reached at [phone]

## 2018-12-05 NOTE — Telephone Encounter (Signed)
Copied from [place] [id number]. Topic: Quick Communication - See Telephone Encounter >> Dec 05, 2018 10:28 AM Valla Leaver wrote: CRM for notification. See Telephone encounter for: 12/05/18. Jocelyn, CNA with assisted living calling to notify Dr. Sanda Klein that the patient fell today and hasn't been feeling her self lately. Moving slow, fatigued, sleepy, forgetful, less frequent to use the bathroom, loss of overall appetite. Urine specimen was sent 12/02/2018 to Dr. Sanda Klein and needs to know if this was received?

## 2018-12-05 NOTE — Telephone Encounter (Signed)
I called and spoke w/daughter She is doing much better, eating and doing much better she says Sleeping a lot, wasn't eating at first Daughter was told that she is doing better on the abx Staff contacted neurologist too She is eating better, better grasp today on what's going on Goes to the bathroom a

## 2018-12-05 NOTE — Telephone Encounter (Signed)
Called facility back, they are giving antibiotic.  Staff thinks it was because the doctor increased her depakote and they have already spoke with the doctor who is prescribing.

## 2018-12-06 NOTE — Telephone Encounter (Signed)
PCP requests this provider to call patient to check on status.  ______ Melissa Combs to [place] CNA- states is doing better today. Patient has more energy, has more strength and improved balance, more talkative, eating well with minimal assistance. She started antibiotics on Friday and is getting them as prescribed. No nausea, vomiting.   Discussed urine culture results, will continue bactrim as patient has had significant improvement. Staff will call if not improving or worsening condition- may consider switching to ciprofloxacin if needed.

## 2018-12-13 ENCOUNTER — Ambulatory Visit: Payer: Medicare Other

## 2018-12-13 ENCOUNTER — Ambulatory Visit: Payer: Medicare Other | Admitting: Family Medicine

## 2018-12-14 ENCOUNTER — Other Ambulatory Visit: Payer: Self-pay | Admitting: Family Medicine

## 2018-12-14 DIAGNOSIS — B0229 Other postherpetic nervous system involvement: Secondary | ICD-10-CM

## 2018-12-14 NOTE — Telephone Encounter (Signed)
I do not prescribe her Arimidex I do not prescribe Combigan I do not prescribe Xalatan The pharmacy needs to contact the prescribers who write those; please ask them to not send those to me again; I keep getting refill requests for those drugs and we're going to ask them to not keep sending them to me  Bactrim should not be refilled; that was a one time prescription  I just prescribed a 3 month supply of Lyrica on 11/02/2018 so that's not due until early June

## 2018-12-15 ENCOUNTER — Other Ambulatory Visit: Payer: Medicare Other

## 2018-12-15 ENCOUNTER — Ambulatory Visit: Payer: Medicare Other | Admitting: Oncology

## 2018-12-15 NOTE — Telephone Encounter (Signed)
Pharmacy said they are having issues with their system and said to disregard the refills, the patient does not need them, it was a glitch in the system.

## 2018-12-21 ENCOUNTER — Telehealth: Payer: Self-pay | Admitting: Family Medicine

## 2018-12-21 NOTE — Telephone Encounter (Signed)
Called pharmacist he will fax over a list, Called asking facility why they need crushed, they state she is spitting them out

## 2018-12-21 NOTE — Telephone Encounter (Signed)
Copied from [place] [id number]. Topic: General - Other >> Dec 21, 2018 11:50 AM Lennox Solders wrote: Reason for [id number] with [facility] is calling and would like an order to have patient medications crushed before given to patient. Fax number [phone]

## 2018-12-21 NOTE — Telephone Encounter (Signed)
Not all of her pills are crushable Please ask them why Please ask the pharmacist to review her entire list of medicines and let us know what CAN be crushed and what can NOT be crushed I will only be able to give an order after that for the pills that I prescribe

## 2018-12-22 MED ORDER — AMLODIPINE BESYLATE 5 MG PO TABS: 5.0000 mg | ORAL_TABLET | Freq: Every day | ORAL | 5 refills | Status: AC

## 2018-12-22 NOTE — Telephone Encounter (Signed)
Ok it has been faxed and sent through

## 2018-12-22 NOTE — Telephone Encounter (Signed)
Joelene Millin put it by the fax machine up front [phone]

## 2018-12-22 NOTE — Telephone Encounter (Signed)
Melissa Combs, please FAX to the living facility to serve as new orders  --------------------------------------------------------------------------------  Discontinue the following: 1.  Acetaminophen / Tylenol Arthritis 650 8 hour arthritis pain tablets 2. Aspirin EC 81 mg   New Orders: 1. Acetaminophen 325 mg tablets, give one to two pills by mouth every 8 hours if needed for pain, fever, headache; okay to crush 2.  Aspirin 81 mg chewable tablets, one by mouth daily    Okay to crush the following: 1. Amlodipine 5 mg tablets 2. Acetaminophen 325 mg tablets 3. Aspirin chewable 81 mg tablets 4. Calcium plus vitamin D 5. Carvedilol 6. Citalopram 7. Lisinopril 8. Pravastatin 9. Vitamin B12    DO NOT CRUSH THE FOLLOWING: 1. Anastrozole (Arimidex); DO NOT CRUSH; contact prescriber for other instructions or an alternate therapy (Dr. Sanda Klein does not prescribe)  2. Divalproex Dr Theora Master Sprinkles -- may open and sprinkle in applesauce to be swallowed whole, but do NOT crush or chew  3. Omeprazole -- may open and sprinkle in applesauce to be swallowed whole, but do NOT crush or chew  4. Lyrica -- may be opened and mixed with water to be swallowed; DO NOT CRUSH  Thank you, Dr. Enid Derry 12/22/2018 1623 hours

## 2018-12-22 NOTE — Telephone Encounter (Signed)
Please fax me the list or enter in the chart what the pharmacist says about which meds can be crushed Thank you

## 2018-12-22 NOTE — Telephone Encounter (Signed)
How do I fax?, what is your fax number?

## 2018-12-23 ENCOUNTER — Other Ambulatory Visit: Payer: Self-pay | Admitting: Family Medicine

## 2018-12-23 MED ORDER — ASPIRIN 81 MG PO CHEW: 81.0000 mg | CHEWABLE_TABLET | Freq: Every day | ORAL | 5 refills | Status: AC

## 2018-12-23 MED ORDER — ACETAMINOPHEN 325 MG PO TABS: 325.0000 mg | ORAL_TABLET | Freq: Three times a day (TID) | ORAL | 1 refills | Status: AC | PRN

## 2018-12-23 NOTE — Progress Notes (Signed)
Request received for aspirin and tylenol Approved

## 2018-12-23 NOTE — Telephone Encounter (Signed)
Orders faxed to [facility]

## 2018-12-27 ENCOUNTER — Telehealth: Payer: Self-pay | Admitting: *Deleted

## 2018-12-27 NOTE — Telephone Encounter (Signed)
Staff had called about if ok to crush arimidex and none of the AI can be crushed. It must be swallowed as whole pill. Called and spoke to [facility] and told her and she states this is a small pill and they have been putting it in pudding and she does ok with that. I have also called the pharmacy and let them know that it can't be crushed

## 2018-12-27 NOTE — Telephone Encounter (Signed)
Assisted living calling and states that they never received the fax about getting medications crushed. Please advise. Would like to have the orders to be able to crush the medications that can be crushed. Fax#: [phone]

## 2019-01-02 NOTE — Telephone Encounter (Signed)
Re-faxed.

## 2019-01-03 ENCOUNTER — Telehealth: Payer: Self-pay

## 2019-01-03 ENCOUNTER — Other Ambulatory Visit: Payer: Self-pay | Admitting: Nurse Practitioner

## 2019-01-03 DIAGNOSIS — R8290 Unspecified abnormal findings in urine: Secondary | ICD-10-CM

## 2019-01-03 NOTE — Telephone Encounter (Signed)
Copied from [place] [id number]. Topic: General - Inquiry >> Jan 03, 2019  2:53 PM Scherrie Gerlach wrote: Reason for CRM: Joy at [facility] assisted living called to report pt's urine is foul smelling and pt not acting herself. Requested to pick up urine cup and order for UA .

## 2019-01-03 NOTE — Telephone Encounter (Signed)
Order placed

## 2019-01-04 NOTE — Telephone Encounter (Signed)
Faxed order and called joy to notify

## 2019-01-06 DIAGNOSIS — R4182 Altered mental status, unspecified: Secondary | ICD-10-CM | POA: Diagnosis not present

## 2019-01-06 DIAGNOSIS — R8290 Unspecified abnormal findings in urine: Secondary | ICD-10-CM | POA: Diagnosis not present

## 2019-01-09 ENCOUNTER — Other Ambulatory Visit: Payer: Self-pay | Admitting: Nurse Practitioner

## 2019-01-09 DIAGNOSIS — N390 Urinary tract infection, site not specified: Secondary | ICD-10-CM

## 2019-01-09 LAB — URINE CULTURE
MICRO NUMBER:: 458702
SPECIMEN QUALITY:: ADEQUATE

## 2019-01-09 MED ORDER — SULFAMETHOXAZOLE-TRIMETHOPRIM 800-160 MG PO TABS: 1.0000 | ORAL_TABLET | Freq: Two times a day (BID) | ORAL | 0 refills | Status: DC

## 2019-01-20 ENCOUNTER — Telehealth: Payer: Self-pay

## 2019-01-20 DIAGNOSIS — N390 Urinary tract infection, site not specified: Secondary | ICD-10-CM

## 2019-01-20 NOTE — Telephone Encounter (Signed)
Please inform care giver that she can bring in the patient to give a urine sample so we can make sure the infection has cleared.

## 2019-01-20 NOTE — Telephone Encounter (Signed)
If it has been at least 3 days since they have completed antibiotics it can be retested.

## 2019-01-20 NOTE — Telephone Encounter (Signed)
Copied from [place] [id number]. Topic: General - Inquiry >> Jan 20, 2019 10:55 AM Mathis Bud wrote: Reason for CRM: Care taker [facility]) calling stating she would like to get patient urine retested. Patient was recently diagnosed with a UTI. Caregiver states pills were large and wants to make sure patient is clear of UTI. [facility]) Call back [phone]

## 2019-01-20 NOTE — Telephone Encounter (Signed)
Due to it being so late in the day, someone will drop off a urine sample on Tuesday 01/24/2019 for pt.

## 2019-01-25 ENCOUNTER — Other Ambulatory Visit: Payer: Self-pay | Admitting: Family Medicine

## 2019-01-25 DIAGNOSIS — B0229 Other postherpetic nervous system involvement: Secondary | ICD-10-CM

## 2019-01-26 ENCOUNTER — Other Ambulatory Visit: Payer: Self-pay

## 2019-01-26 ENCOUNTER — Other Ambulatory Visit (INDEPENDENT_AMBULATORY_CARE_PROVIDER_SITE_OTHER): Payer: Medicare Other

## 2019-01-26 DIAGNOSIS — N390 Urinary tract infection, site not specified: Secondary | ICD-10-CM

## 2019-01-26 LAB — POCT URINALYSIS DIPSTICK
Appearance: NORMAL
Bilirubin, UA: NEGATIVE
Blood, UA: NEGATIVE
Glucose, UA: NEGATIVE
Ketones, UA: NEGATIVE
Nitrite, UA: NEGATIVE
Protein, UA: NEGATIVE
Spec Grav, UA: 1.02 (ref 1.010–1.025)
Urobilinogen, UA: 0.2 E.U./dL
pH, UA: 6.5 (ref 5.0–8.0)

## 2019-01-27 ENCOUNTER — Ambulatory Visit: Payer: Self-pay | Admitting: Family Medicine

## 2019-01-27 LAB — URINE CULTURE
MICRO NUMBER:: 514767
SPECIMEN QUALITY:: ADEQUATE

## 2019-01-27 NOTE — Chronic Care Management (AMB) (Signed)
Chronic Care Management   Note  01/27/2019 Name: Shahira Fiske MRN: [id number] DOB: [birth year]  Devona Holmes is a 85 y.o. year old female who is a primary care patient of Lada, Satira Anis, MD. I reached out to [name] by phone today in response to a referral sent by Ms. [employer] health plan.    Ms. Slagel was given information about Chronic Care Management services today including:  1. CCM service includes personalized support from designated clinical staff supervised by her physician, including individualized plan of care and coordination with other care providers 2. 24/7 contact phone numbers for assistance for urgent and routine care needs. 3. Service will only be billed when office clinical staff spend 20 minutes or more in a month to coordinate care. 4. Only one practitioner may furnish and bill the service in a calendar month. 5. The patient may stop CCM services at any time (effective at the end of the month) by phone call to the office staff. 6. The patient will be responsible for cost sharing (co-pay) of up to 20% of the service fee (after annual deductible is met). [place] Nurse agreed to services and verbal consent obtained.   Follow up plan: Telephone appointment with CCM team member scheduled for: 01/31/2019  [place]  ??[email or web address]   ??[phone]

## 2019-01-31 ENCOUNTER — Other Ambulatory Visit: Payer: Self-pay

## 2019-01-31 ENCOUNTER — Ambulatory Visit (INDEPENDENT_AMBULATORY_CARE_PROVIDER_SITE_OTHER): Payer: Medicare Other

## 2019-01-31 DIAGNOSIS — R4182 Altered mental status, unspecified: Secondary | ICD-10-CM

## 2019-01-31 DIAGNOSIS — E785 Hyperlipidemia, unspecified: Secondary | ICD-10-CM

## 2019-01-31 DIAGNOSIS — N390 Urinary tract infection, site not specified: Secondary | ICD-10-CM

## 2019-01-31 DIAGNOSIS — I10 Essential (primary) hypertension: Secondary | ICD-10-CM

## 2019-01-31 NOTE — Patient Instructions (Addendum)
  Thank you allowing the Chronic Care Management Team to be a part of your care! It was a pleasure speaking with you today!  1. Please contact CCM RN CM if any additional needs or concerns arise.  CCM (Chronic Care Management) Team   Trish Fountain RN, BSN Nurse Care Coordinator  [phone]  Ruben Reason PharmD  Clinical Pharmacist  [phone]   Elliot Gurney, LCSW Clinical Social Worker [phone]  Goals    . caregiver stated 'We are providing care for patient based on doctors orders" (pt-stated)     Current Barriers:  . Cognitive Deficits  Nurse Case Manager Clinical Goal(s):  Marland Kitchen Over the next 30 days, caregivers will contact CCM RN CM if additional patient needs or concerns arise  Interventions:  . Completed initial health assessment, medications reconcilliation, and disease specific assessment . Provided assisted living director and patient's caregiver with CCM RN CM contact information  Patient Self Care Activities:  . Currently UNABLE TO independently perform all ADLs  Initial goal documentation          The patient's caregiver verbalized understanding of instructions provided today and declined a print copy of patient instruction materials.   The patient's caregiver has been provided with contact information for the care management team and has been advised to call with any health related questions or concerns.   SYMPTOMS OF A STROKE   You have any symptoms of stroke. "BE FAST" is an easy way to remember the main warning signs: ? B - Balance. Signs are dizziness, sudden trouble walking, or loss of balance. ? E - Eyes. Signs are trouble seeing or a sudden change in how you see. ? F - Face. Signs are sudden weakness or loss of feeling of the face, or the face or eyelid drooping on one side. ? A - Arms. Signs are weakness or loss of feeling in an arm. This happens suddenly and usually on one side of the body. ? S - Speech. Signs are sudden trouble  speaking, slurred speech, or trouble understanding what people say. ? T - Time. Time to call emergency services. Write down what time symptoms started.  You have other signs of stroke, such as: ? A sudden, very bad headache with no known cause. ? Feeling sick to your stomach (nausea). ? Throwing up (vomiting). ? Jerky movements you cannot control (seizure).  SYMPTOMS OF A HEART ATTACK  What are the signs or symptoms? Symptoms of this condition include:  Chest pain. It may feel like: ? Crushing or squeezing. ? Tightness, pressure, fullness, or heaviness.  Pain in the arm, neck, jaw, back, or upper body.  Shortness of breath.  Heartburn.  Indigestion.  Nausea.  Cold sweats.  Feeling tired.  Sudden lightheadedness.

## 2019-01-31 NOTE — Chronic Care Management (AMB) (Addendum)
  Chronic Care Management   Initial Visit Note  02/02/2019 Name: Melissa Combs MRN: [id number] DOB: 12-27-1929  Subjective: per caregiver "We have to do everything for her because of her dementia"  Objective:  BP Readings from Last 3 Encounters:  09/15/18 (!) 173/74  08/16/18 128/72  05/27/18 (!) 154/64    Assessment: Melissa Combs is a 85 year old female patient of [facility] who was referred to the CCM program by her health plan. She has a history of but not limited to HTN, Severe Dementia, TIA, CKD, and Dyslipidemia. Today RN CM followed up with Melissa Combs caregiver Waynetta Pean to complete health and needs assessment.  Melissa Combs has been living in an [place] ([place]) for several years. According to her caregiver, she is unable to complete any ADLs independently. She ambulates intermittently with a walker and assist x 1. She is able to feed herself but often needs cueing and needs feeding by end of meal. Her medications are administered to her and reconciled today. She is on a low sodium diet specifically with no bacon or sausage for breakfast. Her BP is checked weekly and SPB 160/60. She is weighed monthly and weight remains stable at 132 lbs. She has fallen within the last 3 months, but due to medication and neurologist adjusted dosage.   Review of patient status, including review of consultants reports, relevant laboratory and other test results, and collaboration with appropriate care team members and the patient's provider was performed as part of comprehensive patient evaluation and provision of chronic care management services.    0 ED/ 0 Hospitalizations in the last 6 months  Goals    . caregiver stated 'We are providing care for patient based on doctors orders" (pt-stated)     Current Barriers:  . Cognitive Deficits  Nurse Case Manager Clinical Goal(s):  Marland Kitchen Over the next 30 days, caregivers will contact CCM RN CM if additional  patient needs or concerns arise  Interventions:  . Completed initial health assessment, medications reconcilliation, and disease specific assessment . Provided assisted living director and patient's caregiver with CCM RN CM contact information  Patient Self Care Activities:  . Currently UNABLE TO independently perform all ADLs  Initial goal documentation         Follow up plan:  The patient's caregiver at [facility] has been provided with contact information for the care management team and has been advised to call with any health related questions or concerns.     Braison Snoke E. Rollene Rotunda, RN, BSN Nurse Care Coordinator [facility] Care Management  [phone]

## 2019-02-10 ENCOUNTER — Ambulatory Visit: Payer: Medicare Other | Admitting: Oncology

## 2019-02-10 ENCOUNTER — Other Ambulatory Visit: Payer: Medicare Other

## 2019-02-14 ENCOUNTER — Ambulatory Visit: Payer: Medicare Other | Admitting: Nurse Practitioner

## 2019-02-17 ENCOUNTER — Other Ambulatory Visit: Payer: Self-pay

## 2019-02-20 ENCOUNTER — Other Ambulatory Visit: Payer: Self-pay | Admitting: *Deleted

## 2019-02-20 ENCOUNTER — Inpatient Hospital Stay (HOSPITAL_BASED_OUTPATIENT_CLINIC_OR_DEPARTMENT_OTHER): Payer: Medicare Other | Admitting: Oncology

## 2019-02-20 ENCOUNTER — Inpatient Hospital Stay: Payer: Medicare Other | Attending: Oncology

## 2019-02-20 ENCOUNTER — Encounter: Payer: Self-pay | Admitting: Oncology

## 2019-02-20 ENCOUNTER — Other Ambulatory Visit: Payer: Self-pay

## 2019-02-20 VITALS — BP 138/78 | HR 78 | Temp 97.1°F | Wt 138.0 lb

## 2019-02-20 DIAGNOSIS — I129 Hypertensive chronic kidney disease with stage 1 through stage 4 chronic kidney disease, or unspecified chronic kidney disease: Secondary | ICD-10-CM | POA: Insufficient documentation

## 2019-02-20 DIAGNOSIS — Z170 Estrogen receptor positive status [ER+]: Secondary | ICD-10-CM | POA: Insufficient documentation

## 2019-02-20 DIAGNOSIS — C50412 Malignant neoplasm of upper-outer quadrant of left female breast: Secondary | ICD-10-CM | POA: Diagnosis not present

## 2019-02-20 DIAGNOSIS — C50912 Malignant neoplasm of unspecified site of left female breast: Secondary | ICD-10-CM

## 2019-02-20 DIAGNOSIS — Z780 Asymptomatic menopausal state: Secondary | ICD-10-CM

## 2019-02-20 DIAGNOSIS — N189 Chronic kidney disease, unspecified: Secondary | ICD-10-CM | POA: Diagnosis not present

## 2019-02-20 DIAGNOSIS — Z79811 Long term (current) use of aromatase inhibitors: Secondary | ICD-10-CM | POA: Diagnosis not present

## 2019-02-20 DIAGNOSIS — Z5181 Encounter for therapeutic drug level monitoring: Secondary | ICD-10-CM

## 2019-02-20 LAB — COMPREHENSIVE METABOLIC PANEL
ALT: 6 U/L (ref 0–44)
AST: 18 U/L (ref 15–41)
Albumin: 3.8 g/dL (ref 3.5–5.0)
Alkaline Phosphatase: 40 U/L (ref 38–126)
Anion gap: 9 (ref 5–15)
BUN: 23 mg/dL (ref 8–23)
CO2: 30 mmol/L (ref 22–32)
Calcium: 9.1 mg/dL (ref 8.9–10.3)
Chloride: 101 mmol/L (ref 98–111)
Creatinine, Ser: 0.74 mg/dL (ref 0.44–1.00)
GFR calc Af Amer: >60 mL/min (ref >60)
GFR calc non Af Amer: >60 mL/min (ref >60)
Glucose, Bld: 87 mg/dL (ref 70–99)
Potassium: 3.8 mmol/L (ref 3.5–5.1)
Sodium: 140 mmol/L (ref 135–145)
Total Bilirubin: 0.5 mg/dL (ref 0.3–1.2)
Total Protein: 7.6 g/dL (ref 6.5–8.1)

## 2019-02-20 NOTE — Progress Notes (Signed)
   Hematology/Oncology Consult note [facility]  [phone] [phone]  Patient Care Team: Lada, Melinda P, MD as PCP - General (Family Medicine) Crisp, Gregory, MD as Attending Physician (Pain Medicine) Potter, Zachary E, MD as Referring Physician (Neurology)   Name of the patient: Melissa Combs  [id number]  07/04/1930   Date of visit: 02/20/19  Diagnosis- invasive mammary carcinoma of the left breast clinical prognostic stage IIAT3 N0 M0 ER 90% positive PR 1% positive HER-2/neu negative   Chief complaint/ Reason for visit-routine follow-up of breast cancer on Arimidex  Heme/Onc history: patient is a 85-year-old female with a past medical history significant for dementia, hypertension, hyperlipidemia and chronic kidney disease among other medical problems. She has been a resident of group home since 2015 due to her gradually worsening dementia. Her only close family currently is her daughter who lives in [place] and is with her today along with her caregiver from group home. With regards to her dementia patient is able to ambulate with the help of a walker. She can carry out conversations and is not incontinent of her urine and stool. She is able to eat without any aspiration issues and overall she has been doing well at the nursing home without any recent hospitalizations. Her caregiver noticed a left breast mass recently which prompted a mammogram. Mammogram showed a 5.1 cm mass in the 2 o'clock position of the left breast. No evidence of axillary adenopathy. No evidence of breast masses in the right breast. Patient has had a prior breast biopsy and surgery in the 1970s which the daughter sayswas not malignancy. No other family history of breast cancer, ovarian cancer, colon or prostate cancer. There is a family history of malignant hyperthermia with nitrous oxide.  Patient is currently pleasantly confused. She is not  aware about her underlying medical condition and history is obtained as above with the help of her daughter and caregiver. Overall her appetite is good and she has not had any unintentional weight loss. She does not complain of any pain today  Patient underwent core biopsy which showed invasive mammary carcinoma, grade 2, ER greater than 90% positive, PR 1% positive and HER-2 equivocal by IHC.FISH negative  Patient was seen by Dr. Pabonfrom surgery and given family history of malignant hyperthermia along with patient age and dementia she was not deemed to be a surgical candidate.arimidex started on 04/14/18   Interval history-caregiver could not accompany patient.  Patient unable to provide any history because of her dementia.  I did speak to her caregiver as well as her daughter over the phone today.  Caregiver reports that the left breast mass appears increased in size.  Patient's oral intake has been poor.  ECOG PS- 3 Pain scale-unable to obtain   Review of systems- Review of Systems  Unable to perform ROS: Dementia      No Known Allergies   Past Medical History:  Diagnosis Date  . Allergy   . Anemia   . Arthritis   . B12 deficiency 11/06/2015   Less than 400 October 2016  . Chronic constipation   . Dementia (HCC)   . Dementia (HCC)   . Family history of adverse reaction to anesthesia    brother, daughter and grandson had malignant hyperthermia in the past  . Glaucoma   . History of fibrocystic disease of breast    biopsy done, negative  . History of hand fracture    left  . History of shingles   2014  . History of wrist fracture    left  . Hypertension   . Impacted cerumen   . Malignant hyperthermia   . Pneumonia   . Postherpetic neuralgia   . TIA (transient ischemic attack) Feb 2016     Past Surgical History:  Procedure Laterality Date  . BREAST BIOPSY Left 03/24/2018   path pending  . BREAST LUMPECTOMY     unsure which side  . CATARACT EXTRACTION  W/PHACO Right 01/12/2017   Procedure: CATARACT EXTRACTION PHACO AND INTRAOCULAR LENS PLACEMENT (IOC);  Surgeon: Birder Robson, MD;  Location: [facility];  Service: Ophthalmology;  Laterality: Right;  [place] 1:05.8 AP% 26.0 CDE 17.07 Fluid pack lot # [id number]  . ESOPHAGOGASTRODUODENOSCOPY (EGD) WITH PROPOFOL N/A 02/10/2018   Procedure: ESOPHAGOGASTRODUODENOSCOPY (EGD) WITH PROPOFOL;  Surgeon: Lin Landsman, MD;  Location: [facility];  Service: Gastroenterology;  Laterality: N/A;    Social History   Socioeconomic History  . Marital status: Single    Spouse name: Not on file  . Number of children: Not on file  . Years of education: Not on file  . Highest education level: Not on file  Occupational History  . Not on file  Social Needs  . Financial resource strain: Not on file  . Food insecurity    Worry: Not on file    Inability: Not on file  . Transportation needs    Medical: Not on file    Non-medical: Not on file  Tobacco Use  . Smoking status: Never Smoker  . Smokeless tobacco: Never Used  Substance and Sexual Activity  . Alcohol use: No    Alcohol/week: 0.0 standard drinks  . Drug use: No  . Sexual activity: Never  Lifestyle  . Physical activity    Days per week: Not on file    Minutes per session: Not on file  . Stress: Not on file  Relationships  . Social Herbalist on phone: Not on file    Gets together: Not on file    Attends religious service: Not on file    Active member of club or organization: Not on file    Attends meetings of clubs or organizations: Not on file    Relationship status: Not on file  . Intimate partner violence    Fear of current or ex partner: Not on file    Emotionally abused: Not on file    Physically abused: Not on file    Forced sexual activity: Not on file  Other Topics Concern  . Not on file  Social History Narrative  . Not on file    Family History  Family history unknown: Yes     Current Outpatient  Medications:  .  acetaminophen (TYLENOL) 325 MG tablet, Take 1-2 tablets (325-650 mg total) by mouth every 8 (eight) hours as needed. for pain, fever, headache; okay to crush, Disp: 100 tablet, Rfl: 1 .  amLODipine (NORVASC) 5 MG tablet, Take 1 tablet (5 mg total) by mouth daily. (may be crushed), Disp: 30 tablet, Rfl: 5 .  anastrozole (ARIMIDEX) 1 MG tablet, Take 1 tablet (1 mg total) by mouth daily., Disp: 30 tablet, Rfl: 3 .  aspirin 81 MG chewable tablet, Chew 1 tablet (81 mg total) by mouth daily., Disp: 30 tablet, Rfl: 5 .  Bismuth Subsalicylate 433 IR/51OA SUSP, Take 30 mLs (1,050 mg total) by mouth 2 (two) times daily as needed., Disp: , Rfl:  .  brimonidine-timolol (COMBIGAN) 0.2-0.5 % ophthalmic solution, Place  1 drop into both eyes 2 (two) times daily., Disp: , Rfl:  .  Calcium Carb-Cholecalciferol (CALCIUM-VITAMIN D) 600-400 MG-UNIT TABS, TAKE ONE TABLET BY MOUTH TWO TIMES A DAY, Disp: 58 tablet, Rfl: 0 .  carvedilol (COREG) 6.25 MG tablet, TAKE ONE TABLET BY MOUTH TWO TIMES A DAY, Disp: 60 tablet, Rfl: 5 .  citalopram (CELEXA) 10 MG tablet, TAKE ONE TABLET BY MOUTH EVERY DAY, Disp: 30 tablet, Rfl: 5 .  Diapers & Supplies (HUGGIES PULL-UPS) MISC, 1 each by Does not apply route 3 (three) times daily. Adult size medium, urinary and stool incontinence, LON 99 months, Disp: 100 each, Rfl: 5 .  diclofenac sodium (VOLTAREN) 1 % GEL, APPLY 2GM TOPICALLY TO EACH KNEE TWO TIMES A DAY WHILE AWAKE, Disp: 100 g, Rfl: 5 .  divalproex (DEPAKOTE SPRINKLE) 125 MG capsule, Take 125 mg by mouth 2 (two) times a day., Disp: , Rfl:  .  donepezil (ARICEPT) 10 MG tablet, Take 1 tablet (10 mg total) by mouth at bedtime., Disp: 30 tablet, Rfl: 11 .  Elastic Bandages & Supports (MEDICAL COMPRESSION STOCKINGS) MISC, Put stockings on every morning and remove every night; do not sleep in stockings; get the kind with open toes, Disp: 2 each, Rfl: 0 .  fluticasone (FLONASE) 50 MCG/ACT nasal spray, USE 2 SPRAYS IN BOTH  NOSTRILS ONCE DAILYFOR RHINITIS, Disp: 16 g, Rfl: 11 .  latanoprost (XALATAN) 0.005 % ophthalmic solution, Place 1 drop into both eyes at bedtime., Disp: , Rfl:  .  lisinopril (PRINIVIL,ZESTRIL) 20 MG tablet, TAKE ONE TABLET BY MOUTH EVERY DAY, Disp: 30 tablet, Rfl: 5 .  NAMENDA XR 28 MG CP24 24 hr capsule, TAKE 1 CAPSULE BY MOUTH ONCE DAILY FOR MEMORY, Disp: 30 capsule, Rfl: 11 .  omeprazole (PRILOSEC) 20 MG capsule, TAKE ONE CAPSULE BY MOUTH EVERY DAY, Disp: 30 capsule, Rfl: 2 .  polyethylene glycol powder (GLYCOLAX/MIRALAX) powder, MIX 17 GR (1 CAPFUL) IN 8 OZ WATER/JUICEAND DRINK ONCE DAILY AS NEEDED TO FOR CONSTIPATION, Disp: 527 g, Rfl: 11 .  pravastatin (PRAVACHOL) 40 MG tablet, TAKE ONE TABLET BY MOUTH EVERY DAY, Disp: 30 tablet, Rfl: 5 .  pregabalin (LYRICA) 25 MG capsule, TAKE ONE CAPSULE BY MOUTH EVERY OTHER NIGHT *MAY OPEN AND SPRINKLE INAPPLESAUCE TO BE SWALLOWED WHOLE, BUT DO NOT CRUSH OR CHEW*, Disp: 28 capsule, Rfl: 0 .  vitamin B-12 (CYANOCOBALAMIN) 500 MCG tablet, TAKE ONE TABLET BY MOUTH EVERY DAY, Disp: 30 tablet, Rfl: 5  Physical exam:  Vitals:   02/20/19 1146 02/20/19 1152  BP: 138/78   Pulse: 78   Temp: (!) 97.1 F (36.2 C)   TempSrc: Tympanic   Weight: 142 lb (64.4 kg) 138 lb (62.6 kg)   Physical Exam Constitutional:      General: She is not in acute distress. HENT:     Head: Normocephalic and atraumatic.  Eyes:     Pupils: Pupils are equal, round, and reactive to light.  Neck:     Musculoskeletal: Normal range of motion.  Cardiovascular:     Rate and Rhythm: Normal rate and regular rhythm.     Heart sounds: Normal heart sounds.  Pulmonary:     Effort: Pulmonary effort is normal.     Breath sounds: Normal breath sounds.  Abdominal:     General: Bowel sounds are normal.     Palpations: Abdomen is soft.  Skin:    General: Skin is warm and dry.  Neurological:     Mental Status: She is alert.  Comments: Patient has underlying dementia and is confused    Patient did not cooperate with breast exam today  CMP Latest Ref Rng & Units 02/20/2019  Glucose 70 - 99 mg/dL 87  BUN 8 - 23 mg/dL 23  Creatinine 0.44 - 1.00 mg/dL 0.74  Sodium 135 - 145 mmol/L 140  Potassium 3.5 - 5.1 mmol/L 3.8  Chloride 98 - 111 mmol/L 101  CO2 22 - 32 mmol/L 30  Calcium 8.9 - 10.3 mg/dL 9.1  Total Protein 6.5 - 8.1 g/dL 7.6  Total Bilirubin 0.3 - 1.2 mg/dL 0.5  Alkaline Phos 38 - 126 U/L 40  AST 15 - 41 U/L 18  ALT 0 - 44 U/L 6   CBC Latest Ref Rng & Units 05/27/2018  WBC 3.6 - 11.0 K/uL 8.3  Hemoglobin 12.0 - 16.0 g/dL 10.7(L)  Hematocrit 35.0 - 47.0 % 32.7(L)  Platelets 150 - 440 K/uL 193      Assessment and plan- Patient is a 85 y.o. female invasive mammary carcinoma of the left breast clinically prognostic stage II aT3 N0 M0 ER 90% positive, PR 1% positive and HER-2/neu negative.She was not deemed to be a surgical candidate and is currently on Arimidex.  Caregiver reports that her left breast mass appears larger to palpation.  Patient is not cooperating with breast exam today.  I discussed with the patient's caregiver as well as her daughter that we can proceed with a left breast mammogram as well as CT chest abdomen pelvis and bone scan to get a better picture of how her breast cancer is doing on Arimidex.  I will tentatively see the patient for a video visit along with her caregiver and daughter who lives in [place] after these results are back to discuss further plan patient will continue taking her Arimidex until then  Caregiver and patient's daughter verbalized understanding of the plan.   Visit Diagnosis 1. Malignant neoplasm of left breast in female, estrogen receptor negative, unspecified site of breast ([place])   2. Post-menopausal      Dr. Randa Evens, MD, MPH [facility] [phone] 02/20/2019 3:28 PM

## 2019-02-28 ENCOUNTER — Other Ambulatory Visit: Payer: Medicare Other

## 2019-03-02 ENCOUNTER — Ambulatory Visit
Admission: RE | Admit: 2019-03-02 | Discharge: 2019-03-02 | Disposition: A | Discharge status: Home / Self Care | Payer: Medicare Other | Source: Ambulatory Visit | Attending: Oncology | Admitting: Oncology

## 2019-03-02 ENCOUNTER — Encounter
Admission: RE | Admit: 2019-03-02 | Discharge: 2019-03-02 | Disposition: A | Discharge status: Home / Self Care | Payer: Medicare Other | Source: Ambulatory Visit | Attending: Oncology | Admitting: Oncology

## 2019-03-02 ENCOUNTER — Other Ambulatory Visit: Payer: Self-pay

## 2019-03-02 DIAGNOSIS — C50912 Malignant neoplasm of unspecified site of left female breast: Secondary | ICD-10-CM | POA: Diagnosis not present

## 2019-03-02 DIAGNOSIS — K7689 Other specified diseases of liver: Secondary | ICD-10-CM | POA: Diagnosis not present

## 2019-03-02 DIAGNOSIS — Z780 Asymptomatic menopausal state: Secondary | ICD-10-CM | POA: Diagnosis not present

## 2019-03-02 DIAGNOSIS — I700 Atherosclerosis of aorta: Secondary | ICD-10-CM | POA: Diagnosis not present

## 2019-03-02 DIAGNOSIS — I2510 Atherosclerotic heart disease of native coronary artery without angina pectoris: Secondary | ICD-10-CM | POA: Diagnosis not present

## 2019-03-02 DIAGNOSIS — Z853 Personal history of malignant neoplasm of breast: Secondary | ICD-10-CM | POA: Diagnosis not present

## 2019-03-02 DIAGNOSIS — C50919 Malignant neoplasm of unspecified site of unspecified female breast: Secondary | ICD-10-CM | POA: Diagnosis not present

## 2019-03-02 DIAGNOSIS — N6321 Unspecified lump in the left breast, upper outer quadrant: Secondary | ICD-10-CM | POA: Diagnosis not present

## 2019-03-02 DIAGNOSIS — Z171 Estrogen receptor negative status [ER-]: Secondary | ICD-10-CM | POA: Diagnosis not present

## 2019-03-02 MED ORDER — IOHEXOL 300 MG/ML  SOLN: 75.0000 mL | Freq: Once | INTRAMUSCULAR | Status: DC | PRN

## 2019-03-02 MED ORDER — TECHNETIUM TC 99M MEDRONATE IV KIT
23.7500 | PACK | Freq: Once | INTRAVENOUS | Status: AC | PRN
  Administered 2019-03-02: 10:00:00 23.75 via INTRAVENOUS

## 2019-03-02 MED ORDER — IOHEXOL 300 MG/ML  SOLN
100.0000 mL | Freq: Once | INTRAMUSCULAR | Status: AC | PRN
  Administered 2019-03-02: 100 mL via INTRAVENOUS

## 2019-03-06 ENCOUNTER — Telehealth: Payer: Self-pay | Admitting: *Deleted

## 2019-03-06 ENCOUNTER — Inpatient Hospital Stay: Payer: Medicare Other | Attending: Oncology | Admitting: Oncology

## 2019-03-06 ENCOUNTER — Other Ambulatory Visit: Payer: Self-pay

## 2019-03-06 ENCOUNTER — Encounter: Payer: Self-pay | Admitting: Oncology

## 2019-03-06 VITALS — Wt 132.0 lb

## 2019-03-06 DIAGNOSIS — C50412 Malignant neoplasm of upper-outer quadrant of left female breast: Secondary | ICD-10-CM | POA: Diagnosis not present

## 2019-03-06 DIAGNOSIS — Z79899 Other long term (current) drug therapy: Secondary | ICD-10-CM | POA: Diagnosis not present

## 2019-03-06 DIAGNOSIS — Z170 Estrogen receptor positive status [ER+]: Secondary | ICD-10-CM | POA: Diagnosis not present

## 2019-03-06 DIAGNOSIS — Z79811 Long term (current) use of aromatase inhibitors: Secondary | ICD-10-CM | POA: Diagnosis not present

## 2019-03-06 DIAGNOSIS — Z5181 Encounter for therapeutic drug level monitoring: Secondary | ICD-10-CM

## 2019-03-06 DIAGNOSIS — Z7982 Long term (current) use of aspirin: Secondary | ICD-10-CM | POA: Diagnosis not present

## 2019-03-06 NOTE — Telephone Encounter (Signed)
Informed Joy to contact Dr Sanda Klein her PCP. She replied "I figured that"

## 2019-03-06 NOTE — Telephone Encounter (Signed)
After Video visit today Joy spoke with Nursing Director and they are asking for orders for a hospital be, and a lift chair. Also requesting some stronger than Tylenol for her pain to be sent to pharmacy.

## 2019-03-06 NOTE — Progress Notes (Signed)
I connected with [name] on 03/06/19 at 11:00 AM EDT by video enabled telemedicine visit and verified that I am speaking with the correct person using two identifiers.   I discussed the limitations, risks, security and privacy concerns of performing an evaluation and management service by telemedicine and the availability of in-person appointments. I also discussed with the patient that there may be a patient responsible charge related to this service. The patient expressed understanding and agreed to proceed.  Other persons participating in the visit and their role in the encounter:  Patients daughter Adonis Brook and caregiver Caryl Asp  Patient's location:  Group home Provider's location:  work  Risk analyst Complaint: Discuss CT scan results  History of present illness: patient is a 85 year old female with a past medical history significant for dementia, hypertension, hyperlipidemia and chronic kidney disease among other medical problems. She has been a resident of group home since 2015 due to her gradually worsening dementia. Her only close family currently is her daughter who lives in [place] and is with her today along with her caregiver from group home. With regards to her dementia patient is able to ambulate with the help of a walker. She can carry out conversations and is not incontinent of her urine and stool. She is able to eat without any aspiration issues and overall she has been doing well at the nursing home without any recent hospitalizations. Her caregiver noticed a left breast mass recently which prompted a mammogram. Mammogram showed a 5.1 cm mass in the 2 o'clock position of the left breast. No evidence of axillary adenopathy. No evidence of breast masses in the right breast. Patient has had a prior breast biopsy and surgery in the 1970s which the daughter sayswas not malignancy. No other family history of breast cancer, ovarian cancer, colon or prostate cancer. There is a  family history of malignant hyperthermia with nitrous oxide.  Patient is currently pleasantly confused. She is not aware about her underlying medical condition and history is obtained as above with the help of her daughter and caregiver. Overall her appetite is good and she has not had any unintentional weight loss. She does not complain of any pain today  Patient underwent core biopsy which showed invasive mammary carcinoma, grade 2, ER greater than 90% positive, PR 1% positive and HER-2 equivocal by IHC.FISH negative  Patient was seen by Dr. Melburn Hake surgery and given family history of malignant hyperthermia along with patient age and dementia she was not deemed to be a surgical candidate.arimidex started on 04/14/18  Interval history patient has dementia and cannot give any history.  No acute changes since her last visit and she is doing well at her group home.   Review of Systems  Unable to perform ROS: Dementia    No Known Allergies  Past Medical History:  Diagnosis Date  . Allergy   . Anemia   . Arthritis   . B12 deficiency 11/06/2015   Less than 400 October 2016  . Chronic constipation   . Dementia ([place])   . Dementia ([place])   . Family history of adverse reaction to anesthesia    brother, daughter and grandson had malignant hyperthermia in the past  . Glaucoma   . History of fibrocystic disease of breast    biopsy done, negative  . History of hand fracture    left  . History of shingles 2014  . History of wrist fracture    left  . Hypertension   . Impacted cerumen   .  Malignant hyperthermia   . Pneumonia   . Postherpetic neuralgia   . TIA (transient ischemic attack) Feb 2016    Past Surgical History:  Procedure Laterality Date  . BREAST BIOPSY Left 03/24/2018   path pending  . BREAST LUMPECTOMY     unsure which side  . CATARACT EXTRACTION W/PHACO Right 01/12/2017   Procedure: CATARACT EXTRACTION PHACO AND INTRAOCULAR LENS PLACEMENT (IOC);  Surgeon:  Birder Robson, MD;  Location: [facility];  Service: Ophthalmology;  Laterality: Right;  [place] 1:05.8 AP% 26.0 CDE 17.07 Fluid pack lot # [id number]  . ESOPHAGOGASTRODUODENOSCOPY (EGD) WITH PROPOFOL N/A 02/10/2018   Procedure: ESOPHAGOGASTRODUODENOSCOPY (EGD) WITH PROPOFOL;  Surgeon: Lin Landsman, MD;  Location: [facility];  Service: Gastroenterology;  Laterality: N/A;    Social History   Socioeconomic History  . Marital status: Single    Spouse name: Not on file  . Number of children: Not on file  . Years of education: Not on file  . Highest education level: Not on file  Occupational History  . Not on file  Social Needs  . Financial resource strain: Not on file  . Food insecurity    Worry: Not on file    Inability: Not on file  . Transportation needs    Medical: Not on file    Non-medical: Not on file  Tobacco Use  . Smoking status: Never Smoker  . Smokeless tobacco: Never Used  Substance and Sexual Activity  . Alcohol use: No    Alcohol/week: 0.0 standard drinks  . Drug use: No  . Sexual activity: Never  Lifestyle  . Physical activity    Days per week: Not on file    Minutes per session: Not on file  . Stress: Not on file  Relationships  . Social Herbalist on phone: Not on file    Gets together: Not on file    Attends religious service: Not on file    Active member of club or organization: Not on file    Attends meetings of clubs or organizations: Not on file    Relationship status: Not on file  . Intimate partner violence    Fear of current or ex partner: Not on file    Emotionally abused: Not on file    Physically abused: Not on file    Forced sexual activity: Not on file  Other Topics Concern  . Not on file  Social History Narrative  . Not on file    Family History  Family history unknown: Yes     Current Outpatient Medications:  .  acetaminophen (TYLENOL) 325 MG tablet, Take 1-2 tablets (325-650 mg total) by mouth every 8 (eight)  hours as needed. for pain, fever, headache; okay to crush, Disp: 100 tablet, Rfl: 1 .  amLODipine (NORVASC) 5 MG tablet, Take 1 tablet (5 mg total) by mouth daily. (may be crushed), Disp: 30 tablet, Rfl: 5 .  anastrozole (ARIMIDEX) 1 MG tablet, Take 1 tablet (1 mg total) by mouth daily., Disp: 30 tablet, Rfl: 3 .  aspirin 81 MG chewable tablet, Chew 1 tablet (81 mg total) by mouth daily., Disp: 30 tablet, Rfl: 5 .  brimonidine-timolol (COMBIGAN) 0.2-0.5 % ophthalmic solution, Place 1 drop into both eyes 2 (two) times daily., Disp: , Rfl:  .  Calcium Carb-Cholecalciferol (CALCIUM-VITAMIN D) 600-400 MG-UNIT TABS, TAKE ONE TABLET BY MOUTH TWO TIMES A DAY, Disp: 58 tablet, Rfl: 0 .  carvedilol (COREG) 6.25 MG tablet, TAKE ONE TABLET BY MOUTH  TWO TIMES A DAY, Disp: 60 tablet, Rfl: 5 .  citalopram (CELEXA) 10 MG tablet, TAKE ONE TABLET BY MOUTH EVERY DAY, Disp: 30 tablet, Rfl: 5 .  Diapers & Supplies (HUGGIES PULL-UPS) MISC, 1 each by Does not apply route 3 (three) times daily. Adult size medium, urinary and stool incontinence, LON 99 months, Disp: 100 each, Rfl: 5 .  divalproex (DEPAKOTE SPRINKLE) 125 MG capsule, Take 125 mg by mouth 2 (two) times a day., Disp: , Rfl:  .  latanoprost (XALATAN) 0.005 % ophthalmic solution, Place 1 drop into both eyes at bedtime., Disp: , Rfl:  .  lisinopril (PRINIVIL,ZESTRIL) 20 MG tablet, TAKE ONE TABLET BY MOUTH EVERY DAY, Disp: 30 tablet, Rfl: 5 .  NAMENDA XR 28 MG CP24 24 hr capsule, TAKE 1 CAPSULE BY MOUTH ONCE DAILY FOR MEMORY, Disp: 30 capsule, Rfl: 11 .  omeprazole (PRILOSEC) 20 MG capsule, TAKE ONE CAPSULE BY MOUTH EVERY DAY, Disp: 30 capsule, Rfl: 2 .  pravastatin (PRAVACHOL) 40 MG tablet, TAKE ONE TABLET BY MOUTH EVERY DAY, Disp: 30 tablet, Rfl: 5 .  pregabalin (LYRICA) 25 MG capsule, TAKE ONE CAPSULE BY MOUTH EVERY OTHER NIGHT *MAY OPEN AND SPRINKLE INAPPLESAUCE TO BE SWALLOWED WHOLE, BUT DO NOT CRUSH OR CHEW*, Disp: 28 capsule, Rfl: 0 .  vitamin B-12  (CYANOCOBALAMIN) 500 MCG tablet, TAKE ONE TABLET BY MOUTH EVERY DAY, Disp: 30 tablet, Rfl: 5 .  Bismuth Subsalicylate 102 VO/53GU SUSP, Take 30 mLs (1,050 mg total) by mouth 2 (two) times daily as needed., Disp: , Rfl:  .  diclofenac sodium (VOLTAREN) 1 % GEL, APPLY 2GM TOPICALLY TO EACH KNEE TWO TIMES A DAY WHILE AWAKE (Patient not taking: Reported on 03/06/2019), Disp: 100 g, Rfl: 5 .  donepezil (ARICEPT) 10 MG tablet, Take 1 tablet (10 mg total) by mouth at bedtime., Disp: 30 tablet, Rfl: 11 .  Elastic Bandages & Supports (MEDICAL COMPRESSION STOCKINGS) MISC, Put stockings on every morning and remove every night; do not sleep in stockings; get the kind with open toes, Disp: 2 each, Rfl: 0 .  fluticasone (FLONASE) 50 MCG/ACT nasal spray, USE 2 SPRAYS IN BOTH NOSTRILS ONCE DAILYFOR RHINITIS (Patient not taking: Reported on 03/06/2019), Disp: 16 g, Rfl: 11 .  polyethylene glycol powder (GLYCOLAX/MIRALAX) powder, MIX 17 GR (1 CAPFUL) IN 8 OZ WATER/JUICEAND DRINK ONCE DAILY AS NEEDED TO FOR CONSTIPATION (Patient not taking: Reported on 03/06/2019), Disp: 527 g, Rfl: 11  Ct Chest W Contrast  Result Date: 03/02/2019 CLINICAL DATA:  8 female. Left breast cancer diagnosed 2019. arimidex started on 8/15/ EXAM: CT CHEST, ABDOMEN, AND PELVIS WITH CONTRAST TECHNIQUE: Multidetector CT imaging of the chest, abdomen and pelvis was performed following the standard protocol during bolus administration of intravenous contrast. CONTRAST:  147m OMNIPAQUE IOHEXOL 300 MG/ML  SOLN COMPARISON:  Bone scan same day FINDINGS: CT CHEST FINDINGS Cardiovascular: Coronary artery calcification and aortic atherosclerotic calcification. Mediastinum/Nodes: No axillary or supraclavicular adenopathy. No mediastinal hilar adenopathy. No pericardial effusion. Esophagus normal. Lungs/Pleura: No suspicious pulmonary nodules. . Musculoskeletal: No aggressive osseous lesion. Calcified density in the medial or LEFT breast CT ABDOMEN AND PELVIS FINDINGS  Hepatobiliary: Benign-appearing cysts within liver. No duct dilatation. No enhancing lesion. Pancreas: Pancreas is normal. No ductal dilatation. No pancreatic inflammation. Spleen: Normal spleen Adrenals/urinary tract: Adrenal glands and kidneys are normal. The ureters and bladder normal. Stomach/Bowel: Stomach, small bowel cecum normal. Appendix not identified. The ascending transverse colon normal. Descending colon rectosigmoid colon normal. Large stool ball in the rectum measures  7.9 cm. Vascular/Lymphatic: Atherosclerotic calcification of the aorta. Reproductive: A calcified leiomyoma within the uterus. Adnexa normal. Other: No peritoneal free fluid.  Omental nodularity. Musculoskeletal: No aggressive osseous lesion. Multilevel degenerate change in the spine. IMPRESSION: 1. No evidence breast cancer recurrence or metastasis on chest, abdomen, and pelvis CT. 2. Partially calcified density in the medial LEFT breast is indeterminate. 3. Coronary artery calcification and Aortic Atherosclerosis (ICD10-I70.0). Electronically Signed   By: Suzy Bouchard M.D.   On: 03/02/2019 17:21   Nm Bone Scan Whole Body  Result Date: 03/02/2019 CLINICAL DATA:  Breast cancer EXAM: NUCLEAR MEDICINE WHOLE BODY BONE SCAN TECHNIQUE: Whole body anterior and posterior images were obtained approximately 3 hours after intravenous injection of radiopharmaceutical. RADIOPHARMACEUTICALS:  23.75 mCi Technetium-37mMDP IV COMPARISON:  None Radiographic correlation: CT chest abdomen pelvis 03/02/2019 FINDINGS: Uptake at the shoulders, sternoclavicular joints, hips, and knees, typically degenerative. Uptake at the RIGHT lateral aspect of the lumbar spine at L4-L5, corresponding to degenerative disc disease changes with spur formation and endplate sclerosis on CT. No definite focal sites of abnormal osseous tracer accumulation are seen to suggest osseous metastatic disease. Expected urinary tract and soft tissue distribution of tracer with  tracer contamination at perineum. IMPRESSION: No definite scintigraphic evidence of osseous metastatic disease. Electronically Signed   By: MLavonia DanaM.D.   On: 03/02/2019 17:31   Ct Abdomen Pelvis W Contrast  Result Date: 03/02/2019 CLINICAL DATA:  89female. Left breast cancer diagnosed 2019. arimidex started on 8/15/ EXAM: CT CHEST, ABDOMEN, AND PELVIS WITH CONTRAST TECHNIQUE: Multidetector CT imaging of the chest, abdomen and pelvis was performed following the standard protocol during bolus administration of intravenous contrast. CONTRAST:  1049mOMNIPAQUE IOHEXOL 300 MG/ML  SOLN COMPARISON:  Bone scan same day FINDINGS: CT CHEST FINDINGS Cardiovascular: Coronary artery calcification and aortic atherosclerotic calcification. Mediastinum/Nodes: No axillary or supraclavicular adenopathy. No mediastinal hilar adenopathy. No pericardial effusion. Esophagus normal. Lungs/Pleura: No suspicious pulmonary nodules. . Musculoskeletal: No aggressive osseous lesion. Calcified density in the medial or LEFT breast CT ABDOMEN AND PELVIS FINDINGS Hepatobiliary: Benign-appearing cysts within liver. No duct dilatation. No enhancing lesion. Pancreas: Pancreas is normal. No ductal dilatation. No pancreatic inflammation. Spleen: Normal spleen Adrenals/urinary tract: Adrenal glands and kidneys are normal. The ureters and bladder normal. Stomach/Bowel: Stomach, small bowel cecum normal. Appendix not identified. The ascending transverse colon normal. Descending colon rectosigmoid colon normal. Large stool ball in the rectum measures 7.9 cm. Vascular/Lymphatic: Atherosclerotic calcification of the aorta. Reproductive: A calcified leiomyoma within the uterus. Adnexa normal. Other: No peritoneal free fluid.  Omental nodularity. Musculoskeletal: No aggressive osseous lesion. Multilevel degenerate change in the spine. IMPRESSION: 1. No evidence breast cancer recurrence or metastasis on chest, abdomen, and pelvis CT. 2. Partially  calcified density in the medial LEFT breast is indeterminate. 3. Coronary artery calcification and Aortic Atherosclerosis (ICD10-I70.0). Electronically Signed   By: StSuzy Bouchard.D.   On: 03/02/2019 17:21   UsKoreareast Limited Uni Left Inc Axilla  Result Date: 03/02/2019 CLINICAL DATA:  [id number] old patient with biopsy proven left breast cancer presents for evaluation for chemotherapy response. EXAM: ULTRASOUND OF THE LEFT BREAST COMPARISON:  Previous exam(s). FINDINGS: The patient was unable to physically tolerate a mammographic study. Therefore, only ultrasound was obtained at this visit. Targeted ultrasound is performed, showing an irregular hypoechoic mass within the 2 o'clock position of the left breast. Overall measurements are difficult due to the large size, but measure approximately 5.5 x 4.9 x 3.3 cm (previously 5.1 x  4.8 x 3.6 cm). Slight variations in measurements are likely due to positioning and technique. IMPRESSION: Overall stable appearance of the patient's known left breast malignancy. RECOMMENDATION: Per treatment plan. I have discussed the findings and recommendations with the patient. Results were also provided in writing at the conclusion of the visit. If applicable, a reminder letter will be sent to the patient regarding the next appointment. BI-RADS CATEGORY  6: Known biopsy-proven malignancy. Electronically Signed   By: Kristopher Oppenheim M.D.   On: 03/02/2019 15:12    No images are attached to the encounter.   CMP Latest Ref Rng & Units 02/20/2019  Glucose 70 - 99 mg/dL 87  BUN 8 - 23 mg/dL 23  Creatinine 0.44 - 1.00 mg/dL 0.74  Sodium 135 - 145 mmol/L 140  Potassium 3.5 - 5.1 mmol/L 3.8  Chloride 98 - 111 mmol/L 101  CO2 22 - 32 mmol/L 30  Calcium 8.9 - 10.3 mg/dL 9.1  Total Protein 6.5 - 8.1 g/dL 7.6  Total Bilirubin 0.3 - 1.2 mg/dL 0.5  Alkaline Phos 38 - 126 U/L 40  AST 15 - 41 U/L 18  ALT 0 - 44 U/L 6   CBC Latest Ref Rng & Units 05/27/2018  WBC 3.6 - 11.0 K/uL 8.3   Hemoglobin 12.0 - 16.0 g/dL 10.7(L)  Hematocrit 35.0 - 47.0 % 32.7(L)  Platelets 150 - 440 K/uL 193     Assessment and plan:Patient is a 85 y.o. female invasive mammary carcinoma of the left breast clinically prognostic stage II aT3 N0 M0 ER 90% positive, PR 1% positive and HER-2/neu negative.She was not deemed to be a surgical candidate and is currently on Arimidex.  This is a visit to discuss CT scan results  During her last visit on 02/20/2019.  There was a concern that patient's left breast mass was getting larger.  We did proceed with ultrasound of her left breast as well as CT chest abdomen and pelvis with contrast.  Ultrasound shows that her left breast mass is minimally larger increased from 5.1 to 5.5 cm.  There is no evidence of distant metastatic disease on CT scan as well as bone scan.  Given her overall comorbidities as well as age frailty and underlying dementia I discussed with patient's daughter that would be in the best interest of the patient to continue Arimidex at this time without any changes.  Medications like [facility] have been approved in metastatic setting and could be considered but is associated with increased risk of side effects including neutropenia and increased risk of infection which I would like to avoid especially during this covert pandemic.  Patient's daughter is in agreement with the plan.  Follow-up instructions: Continue Arimidex and patient will see me back in 6 months time with a CBC with differential and CMP  I discussed the assessment and treatment plan with the patient. The patient was provided an opportunity to ask questions and all were answered. The patient agreed with the plan and demonstrated an understanding of the instructions.   The patient was advised to call back or seek an in-person evaluation if the symptoms worsen or if the condition fails to improve as anticipated.   Visit Diagnosis: 1. Malignant neoplasm of upper-outer quadrant of left  breast in female, estrogen receptor positive ([place])   2. Visit for monitoring Arimidex therapy     Dr. Randa Evens, MD, MPH [facility] Pager- [phone] 03/06/2019 11:04 AM

## 2019-03-06 NOTE — Telephone Encounter (Signed)
This should be looked into by pcp.

## 2019-03-07 ENCOUNTER — Telehealth: Payer: Self-pay | Admitting: Family Medicine

## 2019-03-07 NOTE — Telephone Encounter (Signed)
Can provide verbal for therapies. Can we do a 40 minute virtual appointment with her for other concerns- she has not been seen by this office since 2019 and PCP is no longer available.

## 2019-03-07 NOTE — Telephone Encounter (Signed)
Home Health Verbal Orders - Caller/Agency: [facility] Number: [phone] Joy Requesting OT/PT/Skilled Nursing/Social Work/Speech Therapy: Prescription for a lift chair and a hospital ed. The patient is having difficulty get up and out of bed. She needs a different pain medication. The Tylenol is not doing enough.

## 2019-03-08 NOTE — Telephone Encounter (Signed)
Joy notified and virtual visit scheduled.

## 2019-03-16 ENCOUNTER — Encounter: Payer: Self-pay | Admitting: Nurse Practitioner

## 2019-03-16 ENCOUNTER — Other Ambulatory Visit: Payer: Self-pay

## 2019-03-16 ENCOUNTER — Ambulatory Visit (INDEPENDENT_AMBULATORY_CARE_PROVIDER_SITE_OTHER): Payer: Medicare Other | Admitting: Nurse Practitioner

## 2019-03-16 ENCOUNTER — Ambulatory Visit: Payer: Medicare Other

## 2019-03-16 VITALS — BP 144/72 | HR 67 | Temp 97.0°F

## 2019-03-16 DIAGNOSIS — G893 Neoplasm related pain (acute) (chronic): Secondary | ICD-10-CM | POA: Diagnosis not present

## 2019-03-16 DIAGNOSIS — M810 Age-related osteoporosis without current pathological fracture: Secondary | ICD-10-CM | POA: Diagnosis not present

## 2019-03-16 DIAGNOSIS — Z8673 Personal history of transient ischemic attack (TIA), and cerebral infarction without residual deficits: Secondary | ICD-10-CM | POA: Diagnosis not present

## 2019-03-16 DIAGNOSIS — C50412 Malignant neoplasm of upper-outer quadrant of left female breast: Secondary | ICD-10-CM | POA: Diagnosis not present

## 2019-03-16 DIAGNOSIS — Z9181 History of falling: Secondary | ICD-10-CM

## 2019-03-16 DIAGNOSIS — Z170 Estrogen receptor positive status [ER+]: Secondary | ICD-10-CM | POA: Diagnosis not present

## 2019-03-16 DIAGNOSIS — F0390 Unspecified dementia without behavioral disturbance: Secondary | ICD-10-CM | POA: Diagnosis not present

## 2019-03-16 DIAGNOSIS — R296 Repeated falls: Secondary | ICD-10-CM

## 2019-03-16 MED ORDER — DICLOFENAC SODIUM 75 MG PO TBEC: 75.0000 mg | DELAYED_RELEASE_TABLET | Freq: Every day | ORAL | 0 refills | Status: DC

## 2019-03-16 MED ORDER — DICLOFENAC POTASSIUM 50 MG PO TABS: 50.0000 mg | ORAL_TABLET | Freq: Every day | ORAL | 0 refills | Status: DC

## 2019-03-16 NOTE — Progress Notes (Signed)
Virtual Visit via Video Note  I connected with Melissa Combs on 03/16/19 at  3:20 PM EDT by a video enabled telemedicine application and verified that I am speaking with the correct person using two identifiers.   Staff discussed the limitations of evaluation and management by telemedicine and the availability of in person appointments. The patient expressed understanding and agreed to proceed.  Patient location: home  My location: office Other people present: Saundra Shelling- caregiver.  HPI  Patient's PCP is no longer at practice. She is currently staying [facility] assisted living.  She is being treated for malignant neoplasm of left breast by Dr. Janese Banks  She has advanced dementia. She is oriented only to self and this is intermittent. She is verbal and agitated frequently. She endorses pain all over. Her joints are stiff- when changing clothes she screams also when she has to move her limbs or even when sitting down on toilet. She is able to get up with 1-2 person assist and can use walker at time, other times she is unable to safely move. No areas of of skin break down.  Staff was assisting patient to stand but often requiring 2 person assist safe standing due to dementia, osteoporosis and chronic pain syndrome.  Staff is requesting hospital bed, wheel chair, lift chair, to assist patient to help her stand up.  She is not doing physical therapy- she is able to use walker but has difficulty getting up out of the char and at times can be aggressive to staff to pain. ( was using [facility])  Patient is a high fall risk, has had a fall in the past month without injury. Takes acetaminophen PRN for pain without much relief.   Last GFR>60   PHQ2/9: Depression screen [facility] 2/9 08/16/2018 02/03/2018 01/27/2018 11/04/2017 09/07/2017  Decreased Interest 0 0 0 0 0  Down, Depressed, Hopeless 0 0 0 0 0  PHQ - 2 Score 0 0 0 0 0  Altered sleeping 0 - - - -  Tired, decreased energy 0 - - - -  Change in  appetite 0 - - - -  Feeling bad or failure about yourself  0 - - - -  Trouble concentrating 0 - - - -  Moving slowly or fidgety/restless 0 - - - -  Suicidal thoughts 0 - - - -  PHQ-9 Score 0 - - - -  Difficult doing work/chores Not difficult at all - - - -    PHQ reviewed. Negative  Patient Active Problem List   Diagnosis Date Noted  . Malignant neoplasm of upper-outer quadrant of left breast in female, estrogen receptor positive ([place]) 04/15/2018  . Goals of care, counseling/discussion 04/15/2018  . Dysphagia 11/26/2017  . PAC (premature atrial contraction) 09/02/2017  . Chronic pain syndrome 07/08/2017  . Urinary incontinence 10/03/2016  . Allergic rhinitis 11/29/2015  . B12 deficiency 11/06/2015  . Osteoporosis screening 11/01/2015  . Hypochloremia 09/29/2015  . Hyponatremia 09/29/2015  . Medication monitoring encounter 09/27/2015  . Bilateral leg edema 09/27/2015  . Dyslipidemia 06/27/2015  . CKD (chronic kidney disease) stage 3, GFR 30-59 ml/min (HCC) 03/28/2015  . Glaucoma   . Hypertension   . Dementia ([place])   . Anemia   . Arthritis   . Chronic constipation   . Postherpetic neuralgia   . Post herpetic neuralgia head 01/13/2015  . Bilateral occipital neuralgia 01/13/2015  . DDD (degenerative disc disease), lumbar 01/13/2015  . DJD (degenerative joint disease) of knee 01/13/2015  .  TIA (transient ischemic attack) 10/01/2014    Past Medical History:  Diagnosis Date  . Allergy   . Anemia   . Arthritis   . B12 deficiency 11/06/2015   Less than 400 October 2016  . Chronic constipation   . Dementia ([place])   . Dementia ([place])   . Family history of adverse reaction to anesthesia    brother, daughter and grandson had malignant hyperthermia in the past  . Glaucoma   . History of fibrocystic disease of breast    biopsy done, negative  . History of hand fracture    left  . History of shingles 2014  . History of wrist fracture    left  . Hypertension   . Impacted  cerumen   . Malignant hyperthermia   . Pneumonia   . Postherpetic neuralgia   . TIA (transient ischemic attack) Feb 2016    Past Surgical History:  Procedure Laterality Date  . BREAST BIOPSY Left 03/24/2018   path pending  . BREAST LUMPECTOMY     unsure which side  . CATARACT EXTRACTION W/PHACO Right 01/12/2017   Procedure: CATARACT EXTRACTION PHACO AND INTRAOCULAR LENS PLACEMENT (IOC);  Surgeon: Birder Robson, MD;  Location: [facility];  Service: Ophthalmology;  Laterality: Right;  [place] 1:05.8 AP% 26.0 CDE 17.07 Fluid pack lot # [id number]  . ESOPHAGOGASTRODUODENOSCOPY (EGD) WITH PROPOFOL N/A 02/10/2018   Procedure: ESOPHAGOGASTRODUODENOSCOPY (EGD) WITH PROPOFOL;  Surgeon: Lin Landsman, MD;  Location: [facility];  Service: Gastroenterology;  Laterality: N/A;    Social History   Tobacco Use  . Smoking status: Never Smoker  . Smokeless tobacco: Never Used  Substance Use Topics  . Alcohol use: No    Alcohol/week: 0.0 standard drinks     Current Outpatient Medications:  .  acetaminophen (TYLENOL) 325 MG tablet, Take 1-2 tablets (325-650 mg total) by mouth every 8 (eight) hours as needed. for pain, fever, headache; okay to crush, Disp: 100 tablet, Rfl: 1 .  amLODipine (NORVASC) 5 MG tablet, Take 1 tablet (5 mg total) by mouth daily. (may be crushed), Disp: 30 tablet, Rfl: 5 .  anastrozole (ARIMIDEX) 1 MG tablet, Take 1 tablet (1 mg total) by mouth daily., Disp: 30 tablet, Rfl: 3 .  aspirin 81 MG chewable tablet, Chew 1 tablet (81 mg total) by mouth daily., Disp: 30 tablet, Rfl: 5 .  brimonidine-timolol (COMBIGAN) 0.2-0.5 % ophthalmic solution, Place 1 drop into both eyes 2 (two) times daily., Disp: , Rfl:  .  Calcium Carb-Cholecalciferol (CALCIUM-VITAMIN D) 600-400 MG-UNIT TABS, TAKE ONE TABLET BY MOUTH TWO TIMES A DAY, Disp: 58 tablet, Rfl: 0 .  carvedilol (COREG) 6.25 MG tablet, TAKE ONE TABLET BY MOUTH TWO TIMES A DAY, Disp: 60 tablet, Rfl: 5 .  citalopram (CELEXA) 10  MG tablet, TAKE ONE TABLET BY MOUTH EVERY DAY, Disp: 30 tablet, Rfl: 5 .  diclofenac sodium (VOLTAREN) 1 % GEL, APPLY 2GM TOPICALLY TO EACH KNEE TWO TIMES A DAY WHILE AWAKE, Disp: 100 g, Rfl: 5 .  divalproex (DEPAKOTE SPRINKLE) 125 MG capsule, Take 125 mg by mouth 2 (two) times a day., Disp: , Rfl:  .  fluticasone (FLONASE) 50 MCG/ACT nasal spray, USE 2 SPRAYS IN BOTH NOSTRILS ONCE DAILYFOR RHINITIS, Disp: 16 g, Rfl: 11 .  latanoprost (XALATAN) 0.005 % ophthalmic solution, Place 1 drop into both eyes at bedtime., Disp: , Rfl:  .  lisinopril (PRINIVIL,ZESTRIL) 20 MG tablet, TAKE ONE TABLET BY MOUTH EVERY DAY, Disp: 30 tablet, Rfl: 5 .  omeprazole (PRILOSEC)  20 MG capsule, TAKE ONE CAPSULE BY MOUTH EVERY DAY, Disp: 30 capsule, Rfl: 2 .  polyethylene glycol powder (GLYCOLAX/MIRALAX) powder, MIX 17 GR (1 CAPFUL) IN 8 OZ WATER/JUICEAND DRINK ONCE DAILY AS NEEDED TO FOR CONSTIPATION, Disp: 527 g, Rfl: 11 .  pravastatin (PRAVACHOL) 40 MG tablet, TAKE ONE TABLET BY MOUTH EVERY DAY, Disp: 30 tablet, Rfl: 5 .  pregabalin (LYRICA) 25 MG capsule, TAKE ONE CAPSULE BY MOUTH EVERY OTHER NIGHT *MAY OPEN AND SPRINKLE INAPPLESAUCE TO BE SWALLOWED WHOLE, BUT DO NOT CRUSH OR CHEW*, Disp: 28 capsule, Rfl: 0 .  vitamin B-12 (CYANOCOBALAMIN) 500 MCG tablet, TAKE ONE TABLET BY MOUTH EVERY DAY, Disp: 30 tablet, Rfl: 5 .  Bismuth Subsalicylate 643 PI/95JO SUSP, Take 30 mLs (1,050 mg total) by mouth 2 (two) times daily as needed., Disp: , Rfl:  .  Diapers & Supplies (HUGGIES PULL-UPS) MISC, 1 each by Does not apply route 3 (three) times daily. Adult size medium, urinary and stool incontinence, LON 99 months, Disp: 100 each, Rfl: 5 .  donepezil (ARICEPT) 10 MG tablet, Take 1 tablet (10 mg total) by mouth at bedtime. (Patient not taking: Reported on 03/16/2019), Disp: 30 tablet, Rfl: 11 .  Elastic Bandages & Supports (MEDICAL COMPRESSION STOCKINGS) MISC, Put stockings on every morning and remove every night; do not sleep in  stockings; get the kind with open toes, Disp: 2 each, Rfl: 0 .  NAMENDA XR 28 MG CP24 24 hr capsule, TAKE 1 CAPSULE BY MOUTH ONCE DAILY FOR MEMORY (Patient not taking: Reported on 03/16/2019), Disp: 30 capsule, Rfl: 11  No Known Allergies  ROS   No other specific complaints in a complete review of systems (except as listed in HPI above).  Objective  Vitals:   03/16/19 1436  BP: (!) 144/72  Pulse: 67  Temp: (!) 97 F (36.1 C)     There is no height or weight on file to calculate BMI.  Nursing Note and Vital Signs reviewed.  Physical Exam   Constitutional: Patient appears well-developed and well-nourished. No distress.  HENT: Head: Normocephalic and atraumatic. Cardiovascular: Normal rate Pulmonary/Chest: Effort normal  Musculoskeletal: sitting in wheelchair.  Neurological: alert, disoriented at baseline Skin: No rash noted. No erythema.  Psychiatric: Patient sitting quietly, no grimace.     Assessment & Plan  1. Chronic pain due to neoplasm Continue acetaminophen, start low dose nsaid, if worsening consider switching to cymbalta and/or low dose opioid.  - DME Wheelchair manual - For home use only DME Hospital bed - diclofenac (VOLTAREN) 75 MG EC tablet; Take 1 tablet (75 mg total) by mouth daily with breakfast.  Dispense: 90 tablet; Refill: 0  2. Dementia without behavioral disturbance, unspecified dementia type ([place]) - DME Wheelchair manual - For home use only DME Hospital bed  3. Malignant neoplasm of upper-outer quadrant of left breast in female, estrogen receptor positive ([place]) - DME Wheelchair manual - For home use only DME Hospital bed  4. Age-related osteoporosis without current pathological fracture - DME Wheelchair manual - For home use only DME Hospital bed - diclofenac (VOLTAREN) 75 MG EC tablet; Take 1 tablet (75 mg total) by mouth daily with breakfast.  Dispense: 90 tablet; Refill: 0  5. History of TIA (transient ischemic attack) - DME  Wheelchair manual - For home use only DME Hospital bed  6. Frequent falls - Ambulatory referral to Physical Therapy - DME Wheelchair manual - For home use only DME Hospital bed  7. At high risk for injury related  to fall - Ambulatory referral to Physical Therapy - DME Wheelchair manual - For home use only DME Hospital bed   Follow Up Instructions:    I discussed the assessment and treatment plan with the patient. The patient was provided an opportunity to ask questions and all were answered. The patient agreed with the plan and demonstrated an understanding of the instructions.   The patient was advised to call back or seek an in-person evaluation if the symptoms worsen or if the condition fails to improve as anticipated.  I provided 28 minutes of non-face-to-face time during this encounter.   Fredderick Severance, NP

## 2019-03-20 DIAGNOSIS — Z8673 Personal history of transient ischemic attack (TIA), and cerebral infarction without residual deficits: Secondary | ICD-10-CM | POA: Diagnosis not present

## 2019-03-20 DIAGNOSIS — C50412 Malignant neoplasm of upper-outer quadrant of left female breast: Secondary | ICD-10-CM | POA: Diagnosis not present

## 2019-03-20 DIAGNOSIS — F0391 Unspecified dementia with behavioral disturbance: Secondary | ICD-10-CM | POA: Diagnosis not present

## 2019-03-20 DIAGNOSIS — M1710 Unilateral primary osteoarthritis, unspecified knee: Secondary | ICD-10-CM | POA: Diagnosis not present

## 2019-03-20 DIAGNOSIS — R1310 Dysphagia, unspecified: Secondary | ICD-10-CM | POA: Diagnosis not present

## 2019-03-20 DIAGNOSIS — H409 Unspecified glaucoma: Secondary | ICD-10-CM | POA: Diagnosis not present

## 2019-03-20 DIAGNOSIS — B0229 Other postherpetic nervous system involvement: Secondary | ICD-10-CM | POA: Diagnosis not present

## 2019-03-20 DIAGNOSIS — M810 Age-related osteoporosis without current pathological fracture: Secondary | ICD-10-CM | POA: Diagnosis not present

## 2019-03-20 DIAGNOSIS — Z7982 Long term (current) use of aspirin: Secondary | ICD-10-CM | POA: Diagnosis not present

## 2019-03-20 DIAGNOSIS — Z170 Estrogen receptor positive status [ER+]: Secondary | ICD-10-CM | POA: Diagnosis not present

## 2019-03-20 DIAGNOSIS — I491 Atrial premature depolarization: Secondary | ICD-10-CM | POA: Diagnosis not present

## 2019-03-20 DIAGNOSIS — M5481 Occipital neuralgia: Secondary | ICD-10-CM | POA: Diagnosis not present

## 2019-03-20 DIAGNOSIS — M5135 Other intervertebral disc degeneration, thoracolumbar region: Secondary | ICD-10-CM | POA: Diagnosis not present

## 2019-03-20 DIAGNOSIS — Z9181 History of falling: Secondary | ICD-10-CM | POA: Diagnosis not present

## 2019-03-20 DIAGNOSIS — N183 Chronic kidney disease, stage 3 (moderate): Secondary | ICD-10-CM | POA: Diagnosis not present

## 2019-03-20 DIAGNOSIS — I129 Hypertensive chronic kidney disease with stage 1 through stage 4 chronic kidney disease, or unspecified chronic kidney disease: Secondary | ICD-10-CM | POA: Diagnosis not present

## 2019-03-23 DIAGNOSIS — C50412 Malignant neoplasm of upper-outer quadrant of left female breast: Secondary | ICD-10-CM | POA: Diagnosis not present

## 2019-03-23 DIAGNOSIS — N183 Chronic kidney disease, stage 3 (moderate): Secondary | ICD-10-CM | POA: Diagnosis not present

## 2019-03-23 DIAGNOSIS — I129 Hypertensive chronic kidney disease with stage 1 through stage 4 chronic kidney disease, or unspecified chronic kidney disease: Secondary | ICD-10-CM | POA: Diagnosis not present

## 2019-03-23 DIAGNOSIS — M810 Age-related osteoporosis without current pathological fracture: Secondary | ICD-10-CM | POA: Diagnosis not present

## 2019-03-23 DIAGNOSIS — M1710 Unilateral primary osteoarthritis, unspecified knee: Secondary | ICD-10-CM | POA: Diagnosis not present

## 2019-03-23 DIAGNOSIS — F0391 Unspecified dementia with behavioral disturbance: Secondary | ICD-10-CM | POA: Diagnosis not present

## 2019-03-27 ENCOUNTER — Telehealth: Payer: Self-pay

## 2019-03-27 DIAGNOSIS — F0391 Unspecified dementia with behavioral disturbance: Secondary | ICD-10-CM

## 2019-03-27 DIAGNOSIS — R1310 Dysphagia, unspecified: Secondary | ICD-10-CM

## 2019-03-27 DIAGNOSIS — G459 Transient cerebral ischemic attack, unspecified: Secondary | ICD-10-CM

## 2019-03-27 DIAGNOSIS — C50412 Malignant neoplasm of upper-outer quadrant of left female breast: Secondary | ICD-10-CM

## 2019-03-27 DIAGNOSIS — M810 Age-related osteoporosis without current pathological fracture: Secondary | ICD-10-CM

## 2019-03-27 DIAGNOSIS — R600 Localized edema: Secondary | ICD-10-CM

## 2019-03-27 DIAGNOSIS — M1990 Unspecified osteoarthritis, unspecified site: Secondary | ICD-10-CM

## 2019-03-27 DIAGNOSIS — G893 Neoplasm related pain (acute) (chronic): Secondary | ICD-10-CM

## 2019-03-27 MED ORDER — DICLOFENAC SODIUM 75 MG PO TBEC: 75.0000 mg | DELAYED_RELEASE_TABLET | Freq: Every day | ORAL | 0 refills | Status: AC

## 2019-03-27 NOTE — Telephone Encounter (Signed)
Copied from [place] [phone]. Topic: General - Other >> Mar 22, 2019 11:13 AM Leward Quan A wrote: Reason for CRM: Patient called to request orders for the diclofenac (VOLTAREN) 75 MG EC tablet to be faxed to her at fax# [phone]. She also would like to know if she was supposed to get another medication sent to the pharmacy. Per patient she thought there was to be a low dose Nsaid sent also. Please advise Ph# [phone] >> Mar 24, 2019  3:20 PM Samson Frederic wrote: Caryl Asp from [facility]: Pt was told that this information would be faxed on the day of the patient appt however they have not received anything. They are also needing a script for hospital bed/wheeler and lift chair. Please send fax to [phone]

## 2019-03-27 NOTE — Telephone Encounter (Signed)
Orders for wheel chair and hospital bed printed. diclofenac is low dose NSAID. Printed- please fax.

## 2019-03-28 NOTE — Telephone Encounter (Signed)
Faxed

## 2019-03-28 NOTE — Telephone Encounter (Signed)
Joy called back and provided new fax number Fax: [phone] Pharmacy: [phone]

## 2019-03-29 ENCOUNTER — Ambulatory Visit: Payer: Medicare Other | Admitting: Nurse Practitioner

## 2019-03-30 DIAGNOSIS — I129 Hypertensive chronic kidney disease with stage 1 through stage 4 chronic kidney disease, or unspecified chronic kidney disease: Secondary | ICD-10-CM | POA: Diagnosis not present

## 2019-03-30 DIAGNOSIS — M810 Age-related osteoporosis without current pathological fracture: Secondary | ICD-10-CM | POA: Diagnosis not present

## 2019-03-30 DIAGNOSIS — C50412 Malignant neoplasm of upper-outer quadrant of left female breast: Secondary | ICD-10-CM | POA: Diagnosis not present

## 2019-03-30 DIAGNOSIS — N183 Chronic kidney disease, stage 3 (moderate): Secondary | ICD-10-CM | POA: Diagnosis not present

## 2019-03-30 DIAGNOSIS — F0391 Unspecified dementia with behavioral disturbance: Secondary | ICD-10-CM | POA: Diagnosis not present

## 2019-03-30 DIAGNOSIS — M1710 Unilateral primary osteoarthritis, unspecified knee: Secondary | ICD-10-CM | POA: Diagnosis not present

## 2019-04-04 ENCOUNTER — Other Ambulatory Visit: Payer: Self-pay | Admitting: Family Medicine

## 2019-04-04 DIAGNOSIS — B0229 Other postherpetic nervous system involvement: Secondary | ICD-10-CM

## 2019-04-04 MED ORDER — PREGABALIN 25 MG PO CAPS: ORAL_CAPSULE | ORAL | 0 refills | Status: AC

## 2019-04-04 NOTE — Telephone Encounter (Signed)
Melissa Combs is calling and needs a refill on lyrica

## 2019-04-05 DIAGNOSIS — I129 Hypertensive chronic kidney disease with stage 1 through stage 4 chronic kidney disease, or unspecified chronic kidney disease: Secondary | ICD-10-CM | POA: Diagnosis not present

## 2019-04-05 DIAGNOSIS — M1710 Unilateral primary osteoarthritis, unspecified knee: Secondary | ICD-10-CM | POA: Diagnosis not present

## 2019-04-05 DIAGNOSIS — M810 Age-related osteoporosis without current pathological fracture: Secondary | ICD-10-CM | POA: Diagnosis not present

## 2019-04-05 DIAGNOSIS — C50412 Malignant neoplasm of upper-outer quadrant of left female breast: Secondary | ICD-10-CM | POA: Diagnosis not present

## 2019-04-05 DIAGNOSIS — N183 Chronic kidney disease, stage 3 (moderate): Secondary | ICD-10-CM | POA: Diagnosis not present

## 2019-04-05 DIAGNOSIS — F0391 Unspecified dementia with behavioral disturbance: Secondary | ICD-10-CM | POA: Diagnosis not present

## 2019-04-13 DIAGNOSIS — M1710 Unilateral primary osteoarthritis, unspecified knee: Secondary | ICD-10-CM | POA: Diagnosis not present

## 2019-04-13 DIAGNOSIS — F0391 Unspecified dementia with behavioral disturbance: Secondary | ICD-10-CM | POA: Diagnosis not present

## 2019-04-13 DIAGNOSIS — I129 Hypertensive chronic kidney disease with stage 1 through stage 4 chronic kidney disease, or unspecified chronic kidney disease: Secondary | ICD-10-CM | POA: Diagnosis not present

## 2019-04-13 DIAGNOSIS — M810 Age-related osteoporosis without current pathological fracture: Secondary | ICD-10-CM | POA: Diagnosis not present

## 2019-04-13 DIAGNOSIS — C50412 Malignant neoplasm of upper-outer quadrant of left female breast: Secondary | ICD-10-CM | POA: Diagnosis not present

## 2019-04-13 DIAGNOSIS — N183 Chronic kidney disease, stage 3 (moderate): Secondary | ICD-10-CM | POA: Diagnosis not present

## 2019-04-18 ENCOUNTER — Telehealth: Payer: Self-pay | Admitting: Family Medicine

## 2019-04-18 DIAGNOSIS — G459 Transient cerebral ischemic attack, unspecified: Secondary | ICD-10-CM

## 2019-04-18 DIAGNOSIS — F0391 Unspecified dementia with behavioral disturbance: Secondary | ICD-10-CM

## 2019-04-18 DIAGNOSIS — M810 Age-related osteoporosis without current pathological fracture: Secondary | ICD-10-CM | POA: Diagnosis not present

## 2019-04-18 DIAGNOSIS — I129 Hypertensive chronic kidney disease with stage 1 through stage 4 chronic kidney disease, or unspecified chronic kidney disease: Secondary | ICD-10-CM | POA: Diagnosis not present

## 2019-04-18 DIAGNOSIS — N183 Chronic kidney disease, stage 3 (moderate): Secondary | ICD-10-CM | POA: Diagnosis not present

## 2019-04-18 DIAGNOSIS — R600 Localized edema: Secondary | ICD-10-CM

## 2019-04-18 DIAGNOSIS — C50412 Malignant neoplasm of upper-outer quadrant of left female breast: Secondary | ICD-10-CM | POA: Diagnosis not present

## 2019-04-18 DIAGNOSIS — G893 Neoplasm related pain (acute) (chronic): Secondary | ICD-10-CM

## 2019-04-18 DIAGNOSIS — M1710 Unilateral primary osteoarthritis, unspecified knee: Secondary | ICD-10-CM | POA: Diagnosis not present

## 2019-04-18 DIAGNOSIS — R1310 Dysphagia, unspecified: Secondary | ICD-10-CM

## 2019-04-18 DIAGNOSIS — R296 Repeated falls: Secondary | ICD-10-CM

## 2019-04-18 NOTE — Telephone Encounter (Signed)
Copied from [place] [phone]. Topic: General - Other >> Apr 18, 2019  1:47 PM Leward Quan A wrote: Reason for CRM: [place] with [facility] called to request orders for patient to get a raised toilet seat, hospital bed  and a wheel chair can be sent to [facility] or to the [facility] home health office. State that patients state of dementia has increased and she is not able to follow instructions as she did Ph# [phone] [facility] Fax# [phone]

## 2019-04-19 MED ORDER — RAISED TOILET SEAT/LOCK & ARMS MISC: 1.0000 | Freq: Once | 0 refills | Status: AC

## 2019-04-19 NOTE — Telephone Encounter (Signed)
I just printed these will you fax them over. Thanks

## 2019-04-25 ENCOUNTER — Other Ambulatory Visit: Payer: Self-pay

## 2019-04-25 MED ORDER — CARVEDILOL 6.25 MG PO TABS: 6.2500 mg | ORAL_TABLET | Freq: Two times a day (BID) | ORAL | 1 refills | Status: DC

## 2019-04-25 NOTE — Telephone Encounter (Signed)
depakote appears to be prescribed by another provider, she will need levels checked if prescribed by [place]

## 2019-05-02 ENCOUNTER — Telehealth: Payer: Self-pay

## 2019-05-02 NOTE — Telephone Encounter (Signed)
Ok to discontinue 

## 2019-05-02 NOTE — Telephone Encounter (Signed)
Copied from [place] [phone]. Topic: General - Other >> May 02, 2019  2:28 PM Pauline Good wrote: Reason for CRM: need an order that the Voltaren gel was discontinued. Please fax to [phone]

## 2019-05-03 ENCOUNTER — Telehealth: Payer: Self-pay | Admitting: Family Medicine

## 2019-05-03 NOTE — Telephone Encounter (Signed)
Orders for equipment should have been faxed on 7/16 and on 7/27 and again on 04/18/2019 will you ensure they got them. Verbal for OT, PT & speech therapy and social work- if she is in assisted living part of nursing home can be moved to memory care, skilled nursing.

## 2019-05-03 NOTE — Telephone Encounter (Signed)
Home Health Verbal Orders - Caller/Agency: Midge Aver Number: [phone] Requesting OT/PT/Skilled Nursing/Social Work/Speech Therapy: [facility]  Fax: [phone]

## 2019-05-04 ENCOUNTER — Telehealth: Payer: Self-pay | Admitting: Family Medicine

## 2019-05-04 NOTE — Telephone Encounter (Signed)
divalproex (DEPAKOTE SPRINKLE) 125 MG capsule  Need refill

## 2019-05-04 NOTE — Telephone Encounter (Signed)
Left detailed voicemail

## 2019-05-04 NOTE — Telephone Encounter (Signed)
Called pharmacy told them we are not prescriber

## 2019-05-22 ENCOUNTER — Other Ambulatory Visit: Payer: Self-pay

## 2019-05-22 ENCOUNTER — Emergency Department: Payer: Medicare Other

## 2019-05-22 ENCOUNTER — Inpatient Hospital Stay
Admission: EM | Admit: 2019-05-22 | Discharge: 2019-05-25 | DRG: 689 | Disposition: A | Discharge status: Skilled Nursing Facility | Payer: Medicare Other | Attending: Internal Medicine | Admitting: Internal Medicine

## 2019-05-22 DIAGNOSIS — R001 Bradycardia, unspecified: Secondary | ICD-10-CM | POA: Diagnosis present

## 2019-05-22 DIAGNOSIS — Z7951 Long term (current) use of inhaled steroids: Secondary | ICD-10-CM

## 2019-05-22 DIAGNOSIS — R404 Transient alteration of awareness: Secondary | ICD-10-CM | POA: Diagnosis not present

## 2019-05-22 DIAGNOSIS — G9340 Encephalopathy, unspecified: Secondary | ICD-10-CM

## 2019-05-22 DIAGNOSIS — B961 Klebsiella pneumoniae [K. pneumoniae] as the cause of diseases classified elsewhere: Secondary | ICD-10-CM | POA: Diagnosis present

## 2019-05-22 DIAGNOSIS — F0390 Unspecified dementia without behavioral disturbance: Secondary | ICD-10-CM | POA: Diagnosis present

## 2019-05-22 DIAGNOSIS — Z03818 Encounter for observation for suspected exposure to other biological agents ruled out: Secondary | ICD-10-CM | POA: Diagnosis not present

## 2019-05-22 DIAGNOSIS — Z7982 Long term (current) use of aspirin: Secondary | ICD-10-CM

## 2019-05-22 DIAGNOSIS — R4020 Unspecified coma: Secondary | ICD-10-CM | POA: Diagnosis not present

## 2019-05-22 DIAGNOSIS — G9341 Metabolic encephalopathy: Secondary | ICD-10-CM | POA: Diagnosis present

## 2019-05-22 DIAGNOSIS — R5381 Other malaise: Secondary | ICD-10-CM | POA: Diagnosis not present

## 2019-05-22 DIAGNOSIS — Z66 Do not resuscitate: Secondary | ICD-10-CM | POA: Diagnosis present

## 2019-05-22 DIAGNOSIS — Z7401 Bed confinement status: Secondary | ICD-10-CM | POA: Diagnosis not present

## 2019-05-22 DIAGNOSIS — R410 Disorientation, unspecified: Secondary | ICD-10-CM | POA: Diagnosis not present

## 2019-05-22 DIAGNOSIS — I10 Essential (primary) hypertension: Secondary | ICD-10-CM | POA: Diagnosis present

## 2019-05-22 DIAGNOSIS — Z20828 Contact with and (suspected) exposure to other viral communicable diseases: Secondary | ICD-10-CM | POA: Diagnosis present

## 2019-05-22 DIAGNOSIS — R918 Other nonspecific abnormal finding of lung field: Secondary | ICD-10-CM | POA: Diagnosis not present

## 2019-05-22 DIAGNOSIS — E860 Dehydration: Secondary | ICD-10-CM | POA: Diagnosis present

## 2019-05-22 DIAGNOSIS — R1312 Dysphagia, oropharyngeal phase: Secondary | ICD-10-CM | POA: Diagnosis present

## 2019-05-22 DIAGNOSIS — M2550 Pain in unspecified joint: Secondary | ICD-10-CM | POA: Diagnosis not present

## 2019-05-22 DIAGNOSIS — M6281 Muscle weakness (generalized): Secondary | ICD-10-CM | POA: Diagnosis present

## 2019-05-22 DIAGNOSIS — Z741 Need for assistance with personal care: Secondary | ICD-10-CM | POA: Diagnosis present

## 2019-05-22 DIAGNOSIS — Z8673 Personal history of transient ischemic attack (TIA), and cerebral infarction without residual deficits: Secondary | ICD-10-CM | POA: Diagnosis not present

## 2019-05-22 DIAGNOSIS — N390 Urinary tract infection, site not specified: Secondary | ICD-10-CM | POA: Diagnosis not present

## 2019-05-22 DIAGNOSIS — N179 Acute kidney failure, unspecified: Secondary | ICD-10-CM | POA: Diagnosis present

## 2019-05-22 DIAGNOSIS — R41841 Cognitive communication deficit: Secondary | ICD-10-CM | POA: Diagnosis present

## 2019-05-22 DIAGNOSIS — N3001 Acute cystitis with hematuria: Principal | ICD-10-CM | POA: Diagnosis present

## 2019-05-22 DIAGNOSIS — R2689 Other abnormalities of gait and mobility: Secondary | ICD-10-CM | POA: Diagnosis present

## 2019-05-22 DIAGNOSIS — R4182 Altered mental status, unspecified: Secondary | ICD-10-CM | POA: Diagnosis present

## 2019-05-22 DIAGNOSIS — Z79899 Other long term (current) drug therapy: Secondary | ICD-10-CM | POA: Diagnosis not present

## 2019-05-22 DIAGNOSIS — I959 Hypotension, unspecified: Secondary | ICD-10-CM | POA: Diagnosis not present

## 2019-05-22 LAB — URINALYSIS, COMPLETE (UACMP) WITH MICROSCOPIC
Bacteria, UA: NONE SEEN
Bilirubin Urine: NEGATIVE
Glucose, UA: NEGATIVE mg/dL
Ketones, ur: NEGATIVE mg/dL
Nitrite: NEGATIVE
Protein, ur: NEGATIVE mg/dL
RBC / HPF: >50 RBC/hpf — ABNORMAL HIGH (ref 0–5)
Specific Gravity, Urine: 1.012 (ref 1.005–1.030)
pH: 7 (ref 5.0–8.0)

## 2019-05-22 LAB — CBC WITH DIFFERENTIAL/PLATELET
Abs Immature Granulocytes: 0.03 10*3/uL (ref 0.00–0.07)
Basophils Absolute: 0 10*3/uL (ref 0.0–0.1)
Basophils Relative: 0 %
Eosinophils Absolute: 0.3 10*3/uL (ref 0.0–0.5)
Eosinophils Relative: 3 %
HCT: 32.1 % — ABNORMAL LOW (ref 36.0–46.0)
Hemoglobin: 9.9 g/dL — ABNORMAL LOW (ref 12.0–15.0)
Immature Granulocytes: 0 %
Lymphocytes Relative: 21 %
Lymphs Abs: 2.2 10*3/uL (ref 0.7–4.0)
MCH: 29.1 pg (ref 26.0–34.0)
MCHC: 30.8 g/dL (ref 30.0–36.0)
MCV: 94.4 fL (ref 80.0–100.0)
Monocytes Absolute: 1 10*3/uL (ref 0.1–1.0)
Monocytes Relative: 10 %
Neutro Abs: 7 10*3/uL (ref 1.7–7.7)
Neutrophils Relative %: 66 %
Platelets: 183 10*3/uL (ref 150–400)
RBC: 3.4 MIL/uL — ABNORMAL LOW (ref 3.87–5.11)
RDW: 12.8 % (ref 11.5–15.5)
WBC: 10.6 10*3/uL — ABNORMAL HIGH (ref 4.0–10.5)
nRBC: 0 % (ref 0.0–0.2)

## 2019-05-22 LAB — COMPREHENSIVE METABOLIC PANEL
ALT: <5 U/L (ref 0–44)
AST: 22 U/L (ref 15–41)
Albumin: 3.4 g/dL — ABNORMAL LOW (ref 3.5–5.0)
Alkaline Phosphatase: 35 U/L — ABNORMAL LOW (ref 38–126)
Anion gap: 7 (ref 5–15)
BUN: 32 mg/dL — ABNORMAL HIGH (ref 8–23)
CO2: 31 mmol/L (ref 22–32)
Calcium: 8.7 mg/dL — ABNORMAL LOW (ref 8.9–10.3)
Chloride: 103 mmol/L (ref 98–111)
Creatinine, Ser: 1.11 mg/dL — ABNORMAL HIGH (ref 0.44–1.00)
GFR calc Af Amer: 52 mL/min — ABNORMAL LOW (ref >60)
GFR calc non Af Amer: 45 mL/min — ABNORMAL LOW (ref >60)
Glucose, Bld: 87 mg/dL (ref 70–99)
Potassium: 4.8 mmol/L (ref 3.5–5.1)
Sodium: 141 mmol/L (ref 135–145)
Total Bilirubin: 0.7 mg/dL (ref 0.3–1.2)
Total Protein: 6.7 g/dL (ref 6.5–8.1)

## 2019-05-22 LAB — LIPASE, BLOOD: Lipase: 29 U/L (ref 11–51)

## 2019-05-22 MED ORDER — LATANOPROST 0.005 % OP SOLN
1.0000 [drp] | Freq: Every day | OPHTHALMIC | Status: DC
  Administered 2019-05-23 (×2): 1 [drp] via OPHTHALMIC
  Filled 2019-05-22: qty 2.5

## 2019-05-22 MED ORDER — LISINOPRIL 20 MG PO TABS
20.0000 mg | ORAL_TABLET | Freq: Every day | ORAL | Status: DC
  Administered 2019-05-23 – 2019-05-25 (×3): 20 mg via ORAL
  Filled 2019-05-22 (×3): qty 1

## 2019-05-22 MED ORDER — CITALOPRAM HYDROBROMIDE 20 MG PO TABS
10.0000 mg | ORAL_TABLET | Freq: Every day | ORAL | Status: DC
  Administered 2019-05-23 – 2019-05-25 (×3): 10 mg via ORAL
  Filled 2019-05-22 (×3): qty 1

## 2019-05-22 MED ORDER — SENNOSIDES-DOCUSATE SODIUM 8.6-50 MG PO TABS
1.0000 | ORAL_TABLET | Freq: Every evening | ORAL | Status: DC | PRN
  Administered 2019-05-25: 11:00:00 1 via ORAL
  Filled 2019-05-22: qty 1

## 2019-05-22 MED ORDER — ALBUTEROL SULFATE (2.5 MG/3ML) 0.083% IN NEBU: 2.5000 mg | INHALATION_SOLUTION | RESPIRATORY_TRACT | Status: DC | PRN

## 2019-05-22 MED ORDER — SODIUM CHLORIDE 0.9 % IV SOLN: Freq: Once | INTRAVENOUS | Status: AC

## 2019-05-22 MED ORDER — DIVALPROEX SODIUM 125 MG PO CSDR
125.0000 mg | DELAYED_RELEASE_CAPSULE | Freq: Two times a day (BID) | ORAL | Status: DC
  Administered 2019-05-23 – 2019-05-25 (×6): 125 mg via ORAL
  Filled 2019-05-22 (×7): qty 1

## 2019-05-22 MED ORDER — ONDANSETRON HCL 4 MG/2ML IJ SOLN: 4.0000 mg | Freq: Four times a day (QID) | INTRAMUSCULAR | Status: DC | PRN

## 2019-05-22 MED ORDER — VITAMIN B-12 1000 MCG PO TABS
500.0000 ug | ORAL_TABLET | Freq: Every day | ORAL | Status: DC
  Administered 2019-05-23 – 2019-05-25 (×3): 500 ug via ORAL
  Filled 2019-05-22 (×3): qty 1

## 2019-05-22 MED ORDER — SODIUM CHLORIDE 0.9 % IV SOLN
1.0000 g | Freq: Once | INTRAVENOUS | Status: AC
  Administered 2019-05-22: 1 g via INTRAVENOUS
  Filled 2019-05-22: qty 10

## 2019-05-22 MED ORDER — BRIMONIDINE TARTRATE-TIMOLOL 0.2-0.5 % OP SOLN
1.0000 [drp] | Freq: Two times a day (BID) | OPHTHALMIC | Status: DC
  Filled 2019-05-22: qty 5

## 2019-05-22 MED ORDER — SODIUM CHLORIDE 0.9 % IV SOLN
1.0000 g | INTRAVENOUS | Status: DC
  Administered 2019-05-23 – 2019-05-24 (×2): 1 g via INTRAVENOUS
  Filled 2019-05-22: qty 10
  Filled 2019-05-22: qty 1
  Filled 2019-05-22: qty 10

## 2019-05-22 MED ORDER — PRAVASTATIN SODIUM 20 MG PO TABS
40.0000 mg | ORAL_TABLET | Freq: Every day | ORAL | Status: DC
  Administered 2019-05-23 – 2019-05-25 (×3): 40 mg via ORAL
  Filled 2019-05-22 (×3): qty 2

## 2019-05-22 MED ORDER — AMLODIPINE BESYLATE 5 MG PO TABS
5.0000 mg | ORAL_TABLET | Freq: Every day | ORAL | Status: DC
  Administered 2019-05-23 – 2019-05-25 (×3): 5 mg via ORAL
  Filled 2019-05-22 (×3): qty 1

## 2019-05-22 MED ORDER — HYDROCODONE-ACETAMINOPHEN 5-325 MG PO TABS: 1.0000 | ORAL_TABLET | ORAL | Status: DC | PRN

## 2019-05-22 MED ORDER — BISACODYL 5 MG PO TBEC
5.0000 mg | DELAYED_RELEASE_TABLET | Freq: Every day | ORAL | Status: DC | PRN
  Administered 2019-05-25: 11:00:00 5 mg via ORAL
  Filled 2019-05-22: qty 1

## 2019-05-22 MED ORDER — PANTOPRAZOLE SODIUM 40 MG PO TBEC
40.0000 mg | DELAYED_RELEASE_TABLET | Freq: Every day | ORAL | Status: DC
  Administered 2019-05-23 – 2019-05-25 (×3): 40 mg via ORAL
  Filled 2019-05-22 (×3): qty 1

## 2019-05-22 MED ORDER — FLUTICASONE PROPIONATE 50 MCG/ACT NA SUSP
2.0000 | Freq: Every day | NASAL | Status: DC | PRN
  Filled 2019-05-22: qty 16

## 2019-05-22 MED ORDER — SODIUM CHLORIDE 0.9 % IV SOLN
INTRAVENOUS | Status: DC
  Administered 2019-05-22 – 2019-05-25 (×5): via INTRAVENOUS

## 2019-05-22 MED ORDER — BISMUTH SUBSALICYLATE 262 MG/15ML PO SUSP
15.0000 mL | ORAL | Status: DC | PRN
  Filled 2019-05-22: qty 118

## 2019-05-22 MED ORDER — ASPIRIN 81 MG PO CHEW
81.0000 mg | CHEWABLE_TABLET | Freq: Every day | ORAL | Status: DC
  Administered 2019-05-23 – 2019-05-25 (×3): 81 mg via ORAL
  Filled 2019-05-22 (×3): qty 1

## 2019-05-22 MED ORDER — ACETAMINOPHEN 650 MG RE SUPP: 650.0000 mg | Freq: Four times a day (QID) | RECTAL | Status: DC | PRN

## 2019-05-22 MED ORDER — ACETAMINOPHEN 325 MG PO TABS: 650.0000 mg | ORAL_TABLET | Freq: Four times a day (QID) | ORAL | Status: DC | PRN

## 2019-05-22 MED ORDER — ANASTROZOLE 1 MG PO TABS
1.0000 mg | ORAL_TABLET | Freq: Every day | ORAL | Status: DC
  Administered 2019-05-23 – 2019-05-25 (×3): 1 mg via ORAL
  Filled 2019-05-22 (×3): qty 1

## 2019-05-22 MED ORDER — ENOXAPARIN SODIUM 40 MG/0.4ML ~~LOC~~ SOLN
40.0000 mg | SUBCUTANEOUS | Status: DC
  Administered 2019-05-23 – 2019-05-24 (×3): 40 mg via SUBCUTANEOUS
  Filled 2019-05-22 (×3): qty 0.4

## 2019-05-22 MED ORDER — ONDANSETRON HCL 4 MG PO TABS: 4.0000 mg | ORAL_TABLET | Freq: Four times a day (QID) | ORAL | Status: DC | PRN

## 2019-05-22 MED ORDER — CALCIUM CARBONATE-VITAMIN D 500-200 MG-UNIT PO TABS
1.0000 | ORAL_TABLET | Freq: Two times a day (BID) | ORAL | Status: DC
  Administered 2019-05-23 – 2019-05-25 (×6): 1 via ORAL
  Filled 2019-05-22 (×6): qty 1

## 2019-05-22 NOTE — H&P (Signed)
[facility] NAME: Melissa Combs    MR#:  [id number]  DATE OF BIRTH:  19-Sep-1929  DATE OF ADMISSION:  05/22/2019  PRIMARY CARE PHYSICIAN: [name], MD   REQUESTING/REFERRING PHYSICIAN: Dr. Quentin Cornwall.  CHIEF COMPLAINT:   Chief Complaint  Patient presents with   Altered Mental Status   Altered mental status. HISTORY OF PRESENT ILLNESS:  Melissa Combs  is a 85 y.o. female with a known history of multiple medical problems as below.  The patient is sent to ED due to unresponsiveness.  She was found bradycardia at the 30s, given atropine with improvement in her heart rate.  She is confused, unable to provide information.  She is found UTI and dehydration, treated with Rocephin and IV fluids in the ED. Per Dr. Quentin Cornwall, family member agreeable to IV fluid and antibiotics treatment no escalation of care.  Dr. Quentin Cornwall request admission. PAST MEDICAL HISTORY:   Past Medical History:  Diagnosis Date   Allergy    Anemia    Arthritis    B12 deficiency 11/06/2015   Less than 400 October 2016   Chronic constipation    Dementia (HCC)    Dementia (HCC)    Family history of adverse reaction to anesthesia    brother, daughter and grandson had malignant hyperthermia in the past   Glaucoma    History of fibrocystic disease of breast    biopsy done, negative   History of hand fracture    left   History of shingles 2014   History of wrist fracture    left   Hypertension    Impacted cerumen    Malignant hyperthermia    Pneumonia    Postherpetic neuralgia    TIA (transient ischemic attack) Feb 2016    PAST SURGICAL HISTORY:   Past Surgical History:  Procedure Laterality Date   BREAST BIOPSY Left 03/24/2018   path pending   BREAST LUMPECTOMY     unsure which side   CATARACT EXTRACTION W/PHACO Right 01/12/2017   Procedure: CATARACT EXTRACTION PHACO AND INTRAOCULAR LENS PLACEMENT ([facility]);  Surgeon:  Birder Robson, MD;  Location: [facility];  Service: Ophthalmology;  Laterality: Right;  [place] 1:05.8 AP% 26.0 CDE 17.07 Fluid pack lot # [id number]   ESOPHAGOGASTRODUODENOSCOPY (EGD) WITH PROPOFOL N/A 02/10/2018   Procedure: ESOPHAGOGASTRODUODENOSCOPY (EGD) WITH PROPOFOL;  Surgeon: Lin Landsman, MD;  Location: [facility];  Service: Gastroenterology;  Laterality: N/A;    SOCIAL HISTORY:   Social History   Tobacco Use   Smoking status: Never Smoker   Smokeless tobacco: Never Used  Substance Use Topics   Alcohol use: No    Alcohol/week: 0.0 standard drinks    FAMILY HISTORY:   Family History  Family history unknown: Yes  Unable to obtain.  DRUG ALLERGIES:  No Known Allergies  REVIEW OF SYSTEMS:   Review of Systems  Unable to perform ROS: Mental status change    MEDICATIONS AT HOME:   Prior to Admission medications   Medication Sig Start Date End Date Taking? Authorizing Provider  amLODipine (NORVASC) 5 MG tablet Take 1 tablet (5 mg total) by mouth daily. (may be crushed) 12/22/18  Yes Lada, Satira Anis, MD  anastrozole (ARIMIDEX) 1 MG tablet Take 1 tablet (1 mg total) by mouth daily. 04/14/18  Yes Sindy Guadeloupe, MD  aspirin 81 MG chewable tablet Chew 1 tablet (81 mg total) by mouth daily. 12/23/18  Yes Lada, Satira Anis, MD  Bismuth Subsalicylate  525 MG/15ML SUSP Take 15 mLs by mouth every 2 (two) hours as needed (nausea).  09/02/17  Yes Lada, Satira Anis, MD  brimonidine-timolol (COMBIGAN) 0.2-0.5 % ophthalmic solution Place 1 drop into both eyes 2 (two) times daily.   Yes [provider]  Calcium Carb-Cholecalciferol (CALCIUM-VITAMIN D) 600-400 MG-UNIT TABS TAKE ONE TABLET BY MOUTH TWO TIMES A DAY 11/16/18  Yes Sindy Guadeloupe, MD  carvedilol (COREG) 6.25 MG tablet Take 1 tablet (6.25 mg total) by mouth 2 (two) times daily. 04/25/19 07/24/19 Yes Poulose, Bethel Born, NP  citalopram (CELEXA) 10 MG tablet TAKE ONE TABLET BY MOUTH EVERY DAY 12/14/18  Yes Lada, Satira Anis, MD  diclofenac (VOLTAREN) 75 MG EC tablet Take 1 tablet (75 mg total) by mouth daily with breakfast. 03/27/19  Yes Poulose, Bethel Born, NP  divalproex (DEPAKOTE SPRINKLE) 125 MG capsule Take 125 mg by mouth 2 (two) times a day. 12/19/18  Yes [provider]  Elastic Bandages & Supports (MEDICAL COMPRESSION STOCKINGS) MISC Put stockings on every morning and remove every night; do not sleep in stockings; get the kind with open toes 02/03/18  Yes Lada, Satira Anis, MD  fluticasone (FLONASE) 50 MCG/ACT nasal spray USE 2 SPRAYS IN BOTH NOSTRILS ONCE DAILYFOR RHINITIS Patient taking differently: Place 2 sprays into both nostrils daily as needed for allergies.  04/24/16  Yes Lada, Satira Anis, MD  latanoprost (XALATAN) 0.005 % ophthalmic solution Place 1 drop into both eyes at bedtime.   Yes [provider]  lisinopril (PRINIVIL,ZESTRIL) 20 MG tablet TAKE ONE TABLET BY MOUTH EVERY DAY 12/14/18  Yes Lada, Satira Anis, MD  omeprazole (PRILOSEC) 20 MG capsule TAKE ONE CAPSULE BY MOUTH EVERY DAY 12/14/18  Yes Lada, Satira Anis, MD  polyethylene glycol powder (GLYCOLAX/MIRALAX) powder MIX 17 GR (1 CAPFUL) IN 8 OZ WATER/JUICEAND DRINK ONCE DAILY AS NEEDED TO FOR CONSTIPATION 04/14/16  Yes Lada, Satira Anis, MD  pravastatin (PRAVACHOL) 40 MG tablet TAKE ONE TABLET BY MOUTH EVERY DAY 12/14/18  Yes Lada, Satira Anis, MD  pregabalin (LYRICA) 25 MG capsule TAKE ONE CAPSULE BY MOUTH EVERY OTHER NIGHT *MAY OPEN AND SPRINKLE INAPPLESAUCE TO BE SWALLOWED WHOLE, BUT DO NOT CRUSH OR CHEW* 04/04/19  Yes Poulose, Bethel Born, NP  vitamin B-12 (CYANOCOBALAMIN) 500 MCG tablet TAKE ONE TABLET BY MOUTH EVERY DAY 12/14/18  Yes Lada, Satira Anis, MD  acetaminophen (TYLENOL) 325 MG tablet Take 1-2 tablets (325-650 mg total) by mouth every 8 (eight) hours as needed. for pain, fever, headache; okay to crush 12/23/18   Lada, Satira Anis, MD      VITAL SIGNS:  Blood pressure (!) 142/53, pulse 71, temperature 97.9 F (36.6 C), temperature  source Axillary, resp. rate 15, height 5\' 6"  (1.676 m), weight 54.4 kg, SpO2 99 %.  PHYSICAL EXAMINATION:  Physical Exam  GENERAL:  85 y.o.-year-old patient lying in the bed with no acute distress.  EYES: Pupils equal, round, reactive to light and accommodation. No scleral icterus. Extraocular muscles intact.  HEENT: Head atraumatic, normocephalic. NECK:  Supple, no jugular venous distention. No thyroid enlargement, no tenderness.  LUNGS: Normal breath sounds bilaterally, no wheezing, rales,rhonchi or crepitation. No use of accessory muscles of respiration.  CARDIOVASCULAR: S1, S2 normal. No murmurs, rubs, or gallops.  ABDOMEN: Soft, nontender, nondistended. Bowel sounds present. No organomegaly or mass.  EXTREMITIES: No pedal edema, cyanosis, or clubbing.  NEUROLOGIC: Unable to exam. PSYCHIATRIC: The patient is confused and demented. SKIN: No obvious rash, lesion, or ulcer.   LABORATORY  PANEL:   CBC Recent Labs  Lab 05/22/19 1418  WBC 10.6*  HGB 9.9*  HCT 32.1*  PLT 183   ------------------------------------------------------------------------------------------------------------------  Chemistries  Recent Labs  Lab 05/22/19 1418  NA 141  K 4.8  CL 103  CO2 31  GLUCOSE 87  BUN 32*  CREATININE 1.11*  CALCIUM 8.7*  AST 22  ALT <5  ALKPHOS 35*  BILITOT 0.7   ------------------------------------------------------------------------------------------------------------------  Cardiac Enzymes No results for input(s): TROPONINI in the last 168 hours. ------------------------------------------------------------------------------------------------------------------  RADIOLOGY:  Ct Head Wo Contrast  Result Date: 05/22/2019 CLINICAL DATA:  Acute altered level of consciousness. EXAM: CT HEAD WITHOUT CONTRAST TECHNIQUE: Contiguous axial images were obtained from the base of the skull through the vertex without intravenous contrast. COMPARISON:  03/12/2018 FINDINGS: Brain: No  evidence of acute infarction, hemorrhage, hydrocephalus, extra-axial collection or mass lesion/mass effect. There is diffuse cerebral cortical atrophy with secondary ventricular dilatation, progressed since the prior study. The atrophy is most severe in the temporal lobes. 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 Vascular: No hyperdense vessel or unexpected calcification. Skull: Normal. Negative for fracture or focal lesion. Sinuses/Orbits: Normal. Other: None IMPRESSION: No acute intracranial abnormality. Progressive atrophy, most severe in the temporal lobes. 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 Electronically Signed   By: Lorriane Shire M.D.   On: 05/22/2019 15:41   Dg Chest Portable 1 View  Result Date: 05/22/2019 CLINICAL DATA:  Altered mental status. Evaluate for pneumonia, CHF. EXAM: PORTABLE CHEST 1 VIEW COMPARISON:  Chest x-ray dated 09/17/2017. FINDINGS: Study is hypoinspiratory with crowding of the perihilar bronchovascular markings. Subtle opacities within the bilateral upper lobes may also be attributable to the low lung volumes in combination with superimposed osseous structures. No confluent opacity within the lower lungs to suggest a developing pneumonia or pulmonary edema. No pleural effusion or pneumothorax is seen. Osseous structures about the chest are unremarkable. IMPRESSION: Low lung volumes. Subtle opacities within the bilateral upper lobes may be crowded bronchovascular markings secondary to the low lung volumes, with superimposed osseous structures, but early developing pneumonia cannot be excluded. If possible, would consider repeat chest x-ray with better inspiratory effort. No evidence of pneumonia or pulmonary edema within the mid or lower lung zones. Electronically Signed   By: Franki Cabot M.D.   On: 05/22/2019 14:52      IMPRESSION AND PLAN:   Acute metabolic encephalopathy due to UTI and dehydration. The patient will be admitted to  medical floor. Continue Rocephin, IV fluid support, follow-up CBC and urine culture.  Follow-up BMP. Aspiration the fall precaution.  Bradycardia.  Improved with atropine.  Hold Coreg, telemetry monitor.  Hypertension.  Continue home hypertension medication. Dementia.  Aspiration fall precaution. All the records are reviewed and case discussed with ED provider. Management plans discussed with the patient, family and they are in agreement.  CODE STATUS: DNR.  TOTAL TIME TAKING CARE OF THIS PATIENT:  minutes.    Demetrios Loll M.D on 05/22/2019 at 3:43 PM  Between 7am to 6pm - Pager - [phone]  After 6pm go to [email or web address] - Proofreader  [facility]  Office  [phone]  CC: Primary care physician; [name], MD   Note: This dictation was prepared with Dragon dictation along with smaller phrase technology. Any transcriptional errors that result from this process are unin

## 2019-05-22 NOTE — ED Notes (Signed)
Pt otf for imaging 

## 2019-05-22 NOTE — ED Notes (Signed)
Daughter Blythe [facility] informed of patient's status and plan of care

## 2019-05-22 NOTE — ED Notes (Signed)
Report given to Molly, RN

## 2019-05-22 NOTE — ED Triage Notes (Signed)
Pt arrives via [facility] from [facility] for altered mental status. Per nursing home staff pt was sitting in wheelchair and became unresponsive and would not answer questions. Pt's HR found to be in the [id number] and systolic BP in the [id number]. Pt given 0.5 mg atropine in route and HR went up to the 80s and BP up to systolic in the [id number]. Pt responds to me when I ask her questions but is not oriented to situation or time.

## 2019-05-22 NOTE — ED Provider Notes (Signed)
[facility] Emergency Department Provider Note    First MD Initiated Contact with Patient 05/22/19 1335     (approximate)  I have reviewed the triage vital signs and the nursing notes.   HISTORY  Chief Complaint Altered Mental Status  Level V Caveat: AMS  HPI [place] Schild is a 85 y.o. female the below listed past medical history presents the ER for evaluation of unresponsiveness.  When evaluated by first responders he is found to be bradycardic.  She was given atropine in route via EMS with improvement in her heart rate.  Heart rate was initially 30s.  Unable to provide any additional history.    Past Medical History:  Diagnosis Date  . Allergy   . Anemia   . Arthritis   . B12 deficiency 11/06/2015   Less than 400 October 2016  . Chronic constipation   . Dementia ([place])   . Dementia ([place])   . Family history of adverse reaction to anesthesia    brother, daughter and grandson had malignant hyperthermia in the past  . Glaucoma   . History of fibrocystic disease of breast    biopsy done, negative  . History of hand fracture    left  . History of shingles 2014  . History of wrist fracture    left  . Hypertension   . Impacted cerumen   . Malignant hyperthermia   . Pneumonia   . Postherpetic neuralgia   . TIA (transient ischemic attack) Feb 2016   Family History  Family history unknown: Yes   Past Surgical History:  Procedure Laterality Date  . BREAST BIOPSY Left 03/24/2018   path pending  . BREAST LUMPECTOMY     unsure which side  . CATARACT EXTRACTION W/PHACO Right 01/12/2017   Procedure: CATARACT EXTRACTION PHACO AND INTRAOCULAR LENS PLACEMENT (IOC);  Surgeon: Birder Robson, MD;  Location: [facility] ORS;  Service: Ophthalmology;  Laterality: Right;  [place] 1:05.8 AP% 26.0 CDE 17.07 Fluid pack lot # [id number]  . ESOPHAGOGASTRODUODENOSCOPY (EGD) WITH PROPOFOL N/A 02/10/2018   Procedure: ESOPHAGOGASTRODUODENOSCOPY (EGD) WITH PROPOFOL;  Surgeon:  Lin Landsman, MD;  Location: [place];  Service: Gastroenterology;  Laterality: N/A;   Patient Active Problem List   Diagnosis Date Noted  . Malignant neoplasm of upper-outer quadrant of left breast in female, estrogen receptor positive ([place]) 04/15/2018  . Goals of care, counseling/discussion 04/15/2018  . Dysphagia 11/26/2017  . PAC (premature atrial contraction) 09/02/2017  . Chronic pain syndrome 07/08/2017  . Urinary incontinence 10/03/2016  . Allergic rhinitis 11/29/2015  . B12 deficiency 11/06/2015  . Osteoporosis screening 11/01/2015  . Hypochloremia 09/29/2015  . Hyponatremia 09/29/2015  . Medication monitoring encounter 09/27/2015  . Bilateral leg edema 09/27/2015  . Dyslipidemia 06/27/2015  . CKD (chronic kidney disease) stage 3, GFR 30-59 ml/min (HCC) 03/28/2015  . Glaucoma   . Hypertension   . Dementia ([place])   . Anemia   . Arthritis   . Chronic constipation   . Postherpetic neuralgia   . Post herpetic neuralgia head 01/13/2015  . Bilateral occipital neuralgia 01/13/2015  . DDD (degenerative disc disease), lumbar 01/13/2015  . DJD (degenerative joint disease) of knee 01/13/2015  . TIA (transient ischemic attack) 10/01/2014      Prior to Admission medications   Medication Sig Start Date End Date Taking? Authorizing Provider  acetaminophen (TYLENOL) 325 MG tablet Take 1-2 tablets (325-650 mg total) by mouth every 8 (eight) hours as needed. for pain, fever, headache; okay to crush 12/23/18  [name], MD  amLODipine (NORVASC) 5 MG tablet Take 1 tablet (5 mg total) by mouth daily. (may be crushed) 12/22/18   Lada, Satira Anis, MD  anastrozole (ARIMIDEX) 1 MG tablet Take 1 tablet (1 mg total) by mouth daily. 04/14/18   Sindy Guadeloupe, MD  aspirin 81 MG chewable tablet Chew 1 tablet (81 mg total) by mouth daily. 12/23/18   [name], MD  Bismuth Subsalicylate [id number] SUSP Take 30 mLs (1,050 mg total) by mouth 2 (two) times daily as needed. 09/02/17    Lada, Satira Anis, MD  brimonidine-timolol (COMBIGAN) 0.2-0.5 % ophthalmic solution Place 1 drop into both eyes 2 (two) times daily.    [provider]  Calcium Carb-Cholecalciferol (CALCIUM-VITAMIN D) 600-400 MG-UNIT TABS TAKE ONE TABLET BY MOUTH TWO TIMES A DAY 11/16/18   Sindy Guadeloupe, MD  carvedilol (COREG) 6.25 MG tablet Take 1 tablet (6.25 mg total) by mouth 2 (two) times daily. 04/25/19 07/24/19  Poulose, Bethel Born, NP  citalopram (CELEXA) 10 MG tablet TAKE ONE TABLET BY MOUTH EVERY DAY 12/14/18   Lada, Satira Anis, MD  Diapers & Supplies (HUGGIES PULL-UPS) MISC 1 each by Does not apply route 3 (three) times daily. Adult size medium, urinary and stool incontinence, LON 99 months 09/22/16   [name], MD  diclofenac (VOLTAREN) 75 MG EC tablet Take 1 tablet (75 mg total) by mouth daily with breakfast. 03/27/19   Poulose, Bethel Born, NP  divalproex (DEPAKOTE SPRINKLE) 125 MG capsule Take 125 mg by mouth 2 (two) times a day. 12/19/18   [provider]  donepezil (ARICEPT) 10 MG tablet Take 1 tablet (10 mg total) by mouth at bedtime. Patient not taking: Reported on 03/16/2019 10/02/16   [name], MD  Elastic Bandages & Supports (MEDICAL COMPRESSION STOCKINGS) MISC Put stockings on every morning and remove every night; do not sleep in stockings; get the kind with open toes 02/03/18   Lada, Satira Anis, MD  fluticasone (FLONASE) 50 MCG/ACT nasal spray USE 2 SPRAYS IN BOTH NOSTRILS ONCE DAILYFOR RHINITIS 04/24/16   Lada, Satira Anis, MD  latanoprost (XALATAN) 0.005 % ophthalmic solution Place 1 drop into both eyes at bedtime.    [provider]  lisinopril (PRINIVIL,ZESTRIL) 20 MG tablet TAKE ONE TABLET BY MOUTH EVERY DAY 12/14/18   Lada, Satira Anis, MD  NAMENDA XR 28 MG CP24 24 hr capsule TAKE 1 CAPSULE BY MOUTH ONCE DAILY FOR MEMORY Patient not taking: Reported on 03/16/2019 05/13/16   [name], MD  omeprazole (PRILOSEC) 20 MG capsule TAKE ONE CAPSULE BY MOUTH EVERY DAY  12/14/18   Lada, Satira Anis, MD  polyethylene glycol powder (GLYCOLAX/MIRALAX) powder MIX 17 GR (1 CAPFUL) IN 8 OZ WATER/JUICEAND DRINK ONCE DAILY AS NEEDED TO FOR CONSTIPATION 04/14/16   Lada, Satira Anis, MD  pravastatin (PRAVACHOL) 40 MG tablet TAKE ONE TABLET BY MOUTH EVERY DAY 12/14/18   Lada, Satira Anis, MD  pregabalin (LYRICA) 25 MG capsule TAKE ONE CAPSULE BY MOUTH EVERY OTHER NIGHT *MAY OPEN AND SPRINKLE INAPPLESAUCE TO BE SWALLOWED WHOLE, BUT DO NOT CRUSH OR CHEW* 04/04/19   Poulose, Bethel Born, NP  vitamin B-12 (CYANOCOBALAMIN) 500 MCG tablet TAKE ONE TABLET BY MOUTH EVERY DAY 12/14/18   [name], MD    Allergies Patient has no known allergies.    Social History Social History   Tobacco Use  . Smoking status: Never Smoker  . Smokeless tobacco: Never Used  Substance Use Topics  .  Alcohol use: No    Alcohol/week: 0.0 standard drinks  . Drug use: No    Review of Systems Patient denies headaches, rhinorrhea, blurry vision, numbness, shortness of breath, chest pain, edema, cough, abdominal pain, nausea, vomiting, diarrhea, dysuria, fevers, rashes or hallucinations unless otherwise stated above in HPI. ____________________________________________   PHYSICAL EXAM:  VITAL SIGNS: Vitals:   05/22/19 1445 05/22/19 1500  BP:  (!) 142/53  Pulse: 70 71  Resp:    Temp:    SpO2: 98% 99%    Constitutional: Alert and in NAD.  Eyes: Conjunctivae are normal.  Head: Atraumatic. Nose: No congestion/rhinnorhea. Mouth/Throat: Mucous membranes are moist.   Neck: No stridor. Painless ROM.  Cardiovascular: Normal rate, regular rhythm. Grossly normal heart sounds.  Good peripheral circulation. Respiratory: Normal respiratory effort.  No retractions. Lungs CTAB. Gastrointestinal: Soft and nontender. No distention. No abdominal bruits. No CVA tenderness. Genitourinary:  Musculoskeletal: No lower extremity tenderness nor edema.  No joint effusions. Neurologic:  Normal speech and  language. No gross focal neurologic deficits are appreciated. No facial droop Skin:  Skin is warm, dry and intact. No rash noted. Psychiatric: calm and cooperative  ____________________________________________   LABS (all labs ordered are listed, but only abnormal results are displayed)  Results for orders placed or performed during the hospital encounter of 05/22/19 (from the past 24 hour(s))  CBC with Differential/Platelet     Status: Abnormal   Collection Time: 05/22/19  2:18 PM  Result Value Ref Range   WBC 10.6 (H) 4.0 - 10.5 K/uL   RBC 3.40 (L) 3.87 - 5.11 MIL/uL   Hemoglobin 9.9 (L) 12.0 - 15.0 g/dL   HCT 32.1 (L) 36.0 - 46.0 %   MCV 94.4 80.0 - 100.0 fL   MCH 29.1 26.0 - 34.0 pg   MCHC 30.8 30.0 - 36.0 g/dL   RDW 12.8 11.5 - 15.5 %   Platelets 183 150 - 400 K/uL   nRBC 0.0 0.0 - 0.2 %   Neutrophils Relative % 66 %   Neutro Abs 7.0 1.7 - 7.7 K/uL   Lymphocytes Relative 21 %   Lymphs Abs 2.2 0.7 - 4.0 K/uL   Monocytes Relative 10 %   Monocytes Absolute 1.0 0.1 - 1.0 K/uL   Eosinophils Relative 3 %   Eosinophils Absolute 0.3 0.0 - 0.5 K/uL   Basophils Relative 0 %   Basophils Absolute 0.0 0.0 - 0.1 K/uL   Immature Granulocytes 0 %   Abs Immature Granulocytes 0.03 0.00 - 0.07 K/uL  Comprehensive metabolic panel     Status: Abnormal   Collection Time: 05/22/19  2:18 PM  Result Value Ref Range   Sodium 141 135 - 145 mmol/L   Potassium 4.8 3.5 - 5.1 mmol/L   Chloride 103 98 - 111 mmol/L   CO2 31 22 - 32 mmol/L   Glucose, Bld 87 70 - 99 mg/dL   BUN 32 (H) 8 - 23 mg/dL   Creatinine, Ser 1.11 (H) 0.44 - 1.00 mg/dL   Calcium 8.7 (L) 8.9 - 10.3 mg/dL   Total Protein 6.7 6.5 - 8.1 g/dL   Albumin 3.4 (L) 3.5 - 5.0 g/dL   AST 22 15 - 41 U/L   ALT <5 0 - 44 U/L   Alkaline Phosphatase 35 (L) 38 - 126 U/L   Total Bilirubin 0.7 0.3 - 1.2 mg/dL   GFR calc non Af Amer 45 (L) >60 mL/min   GFR calc Af Amer 52 (L) >60 mL/min   Anion  gap 7 5 - 15  Lipase, blood     Status: None    Collection Time: 05/22/19  2:18 PM  Result Value Ref Range   Lipase 29 11 - 51 U/L  Urinalysis, Complete w Microscopic     Status: Abnormal   Collection Time: 05/22/19  2:18 PM  Result Value Ref Range   Color, Urine YELLOW (A) YELLOW   APPearance CLOUDY (A) CLEAR   Specific Gravity, Urine 1.012 1.005 - 1.030   pH 7.0 5.0 - 8.0   Glucose, UA NEGATIVE NEGATIVE mg/dL   Hgb urine dipstick SMALL (A) NEGATIVE   Bilirubin Urine NEGATIVE NEGATIVE   Ketones, ur NEGATIVE NEGATIVE mg/dL   Protein, ur NEGATIVE NEGATIVE mg/dL   Nitrite NEGATIVE NEGATIVE   Leukocytes,Ua MODERATE (A) NEGATIVE   RBC / HPF >50 (H) 0 - 5 RBC/hpf   WBC, UA 21-50 0 - 5 WBC/hpf   Bacteria, UA NONE SEEN NONE SEEN   Squamous Epithelial / LPF 21-50 0 - 5   ____________________________________________  EKG My review and personal interpretation at Time: 13:37   Indication: syncope  Rate: 80  Rhythm: sinus Axis: normal Other: no stemi, poor rwave progression ____________________________________________  RADIOLOGY  I personally reviewed all radiographic images ordered to evaluate for the above acute complaints and reviewed radiology reports and findings.  These findings were personally discussed with the patient.  Please see medical record for radiology report.  ____________________________________________   PROCEDURES  Procedure(s) performed:  Procedures    Critical Care performed: no ____________________________________________   INITIAL IMPRESSION / ASSESSMENT AND PLAN / ED COURSE  Pertinent labs & imaging results that were available during my care of the patient were reviewed by me and considered in my medical decision making (see chart for details).   DDX: dysrhytnmia, aki, electrolyte abn, chf  Milanie Fest is a 85 y.o. who presents to the ED with reported episode of bradycardia hypotension and altered mental status.  Patient was given atropine with improvement in heart rate.  She is now  normotensive no signs of dysrhythmia or block on EKG.  She denies any pain but does have some underlying cephalopathy.  Blood will be sent for by differential.  Clinical Course as of May 21 1530  Mon May 22, 2019  1515 I discussed case with patient's daughter who is her POA.  States that patient would not want to be placed on mechanical ventilation or CPR for performed in the event of cardiac arrest.  Does agree to IV antibiotics and IV fluids.  Will discuss with hospitalist for admission for IV antibiotics IV fluids and observation.   [PR]    Clinical Course User Index [PR] Merlyn Lot, MD    The patient was evaluated in Emergency Department today for the symptoms described in the history of present illness. He/she was evaluated in the context of the global COVID-19 pandemic, which necessitated consideration that the patient might be at risk for infection with the SARS-CoV-2 virus that causes COVID-19. Institutional protocols and algorithms that pertain to the evaluation of patients at risk for COVID-19 are in a state of rapid change based on information released by regulatory bodies including the CDC and federal and state organizations. These policies and algorithms were followed during the patient's care in the ED.  As part of my medical decision making, I reviewed the following data within the [place] notes reviewed and incorporated, Labs reviewed, notes from prior ED visits and [place] Controlled Substance Database  ____________________________________________   FINAL CLINICAL IMPRESSION(S) / ED DIAGNOSES  Final diagnoses:  Acute encephalopathy  AKI (acute kidney injury) ([place])  Acute cystitis with hematuria      NEW MEDICATIONS STARTED DURING THIS VISIT:  New Prescriptions   No medications on file     Note:  This document was prepared using Dragon voice recognition software and may include unintentional dictation errors.    Merlyn Lot,  MD 05/22/19 1531

## 2019-05-23 LAB — BASIC METABOLIC PANEL
Anion gap: 7 (ref 5–15)
BUN: 29 mg/dL — ABNORMAL HIGH (ref 8–23)
CO2: 29 mmol/L (ref 22–32)
Calcium: 8.7 mg/dL — ABNORMAL LOW (ref 8.9–10.3)
Chloride: 105 mmol/L (ref 98–111)
Creatinine, Ser: 0.66 mg/dL (ref 0.44–1.00)
GFR calc Af Amer: >60 mL/min (ref >60)
GFR calc non Af Amer: >60 mL/min (ref >60)
Glucose, Bld: 84 mg/dL (ref 70–99)
Potassium: 3.9 mmol/L (ref 3.5–5.1)
Sodium: 141 mmol/L (ref 135–145)

## 2019-05-23 LAB — CBC
HCT: 29.7 % — ABNORMAL LOW (ref 36.0–46.0)
Hemoglobin: 9.3 g/dL — ABNORMAL LOW (ref 12.0–15.0)
MCH: 29.1 pg (ref 26.0–34.0)
MCHC: 31.3 g/dL (ref 30.0–36.0)
MCV: 92.8 fL (ref 80.0–100.0)
Platelets: 156 10*3/uL (ref 150–400)
RBC: 3.2 MIL/uL — ABNORMAL LOW (ref 3.87–5.11)
RDW: 12.7 % (ref 11.5–15.5)
WBC: 10.2 10*3/uL (ref 4.0–10.5)
nRBC: 0 % (ref 0.0–0.2)

## 2019-05-23 LAB — SARS CORONAVIRUS 2 (TAT 6-24 HRS): SARS Coronavirus 2: NEGATIVE

## 2019-05-23 MED ORDER — BRIMONIDINE TARTRATE 0.2 % OP SOLN
1.0000 [drp] | Freq: Two times a day (BID) | OPHTHALMIC | Status: DC
  Administered 2019-05-23 – 2019-05-24 (×4): 1 [drp] via OPHTHALMIC
  Filled 2019-05-23: qty 5

## 2019-05-23 MED ORDER — TIMOLOL MALEATE 0.5 % OP SOLN
1.0000 [drp] | Freq: Two times a day (BID) | OPHTHALMIC | Status: DC
  Administered 2019-05-23 – 2019-05-24 (×5): 1 [drp] via OPHTHALMIC
  Filled 2019-05-23: qty 5

## 2019-05-23 NOTE — Progress Notes (Signed)
Advanced care plan.  Purpose of the Encounter: CODE STATUS  Parties in Attendance: Discussed with patient's daughter over the phone  Patient's Decision Capacity: None  Subjective/Patient's story: Melissa Combs  is a 85 y.o. female with a known history of multiple medical problems as below.  The patient is sent to ED due to unresponsiveness.  She was found bradycardia at the 30s.  Patient was admitted with UTI.  She has advanced dementia.   Objective/Medical story I discussed with the patient's daughter regarding her overall poor prognosis.   Goals of care determination:   She would like to keep her mother DNR.  Also states that if things were to progress she is willing to consider comfort measures to prevent any type of pain in her mother.  CODE STATUS:  DNR  Time spent discussing advanced care planning: 16 minutes

## 2019-05-23 NOTE — Progress Notes (Signed)
[place] at [facility]                                                                                                                                                                                  Patient Demographics   Melissa Combs, is a 85 y.o. female, DOB - June 21, 1930, [id number]  Admit date - 05/22/2019   Admitting Physician Demetrios Loll, MD  Outpatient Primary MD for the patient is Lada, Satira Anis, MD   LOS - 1  Subjective: Patient is confused which appears to be her baseline.    Review of Systems:   CONSTITUTIONAL: Dementia  Vitals:   Vitals:   05/22/19 1654 05/22/19 1952 05/23/19 0433 05/23/19 1201  BP: (!) 171/73 (!) 164/96 (!) 124/52 139/83  Pulse: 72 74 60 78  Resp: 16 18 18 18   Temp: 97.8 F (36.6 C) 97.6 F (36.4 C) 98.9 F (37.2 C) (!) 97.4 F (36.3 C)  TempSrc: Axillary Oral Oral Oral  SpO2: 97% 100% 100% 100%  Weight:      Height:        Wt Readings from Last 3 Encounters:  05/22/19 54.4 kg  03/06/19 59.9 kg  02/20/19 62.6 kg     Intake/Output Summary (Last 24 hours) at 05/23/2019 1347 Last data filed at 05/23/2019 0558 Gross per 24 hour  Intake 246.25 ml  Output 0 ml  Net 246.25 ml    Physical Exam:   GENERAL: Pleasant-appearing in no apparent distress.  HEAD, EYES, EARS, NOSE AND THROAT: Atraumatic, normocephalic. Extraocular muscles are intact. Pupils equal and reactive to light. Sclerae anicteric. No conjunctival injection. No oro-pharyngeal erythema.  NECK: Supple. There is no jugular venous distention. No bruits, no lymphadenopathy, no thyromegaly.  HEART: Regular rate and rhythm,. No murmurs, no rubs, no clicks.  LUNGS: Clear to auscultation bilaterally. No rales or rhonchi. No wheezes.  ABDOMEN: Soft, flat, nontender, nondistended. Has good bowel sounds. No hepatosplenomegaly appreciated.  EXTREMITIES: No evidence of any cyanosis, clubbing, or peripheral edema.  +2 pedal and radial pulses bilaterally.   NEUROLOGIC: The patient is alert, not oriented  SKIN: Moist and warm with no rashes appreciated.  Psych: Not anxious, depressed LN: No inguinal LN enlargement    Antibiotics   Anti-infectives (From admission, onward)   Start     Dose/Rate Route Frequency Ordered Stop   05/23/19 1500  cefTRIAXone (ROCEPHIN) 1 g in sodium chloride 0.9 % 100 mL IVPB     1 g 200 mL/hr over 30 Minutes Intravenous Every 24 hours 05/22/19 1654     05/22/19 1515  cefTRIAXone (ROCEPHIN) 1 g in sodium chloride 0.9 % 100 mL IVPB  1 g 200 mL/hr over 30 Minutes Intravenous  Once 05/22/19 1508 05/22/19 1542      Medications   Scheduled Meds: . amLODipine  5 mg Oral Daily  . anastrozole  1 mg Oral Daily  . aspirin  81 mg Oral Daily  . brimonidine  1 drop Both Eyes BID   And  . timolol  1 drop Both Eyes BID  . calcium-vitamin D  1 tablet Oral BID  . citalopram  10 mg Oral Daily  . divalproex  125 mg Oral Q12H  . enoxaparin (LOVENOX) injection  40 mg Subcutaneous Q24H  . latanoprost  1 drop Both Eyes QHS  . lisinopril  20 mg Oral Daily  . pantoprazole  40 mg Oral Daily  . pravastatin  40 mg Oral Daily  . vitamin B-12  500 mcg Oral Daily   Continuous Infusions: . sodium chloride 75 mL/hr at 05/23/19 0614  . cefTRIAXone (ROCEPHIN)  IV     PRN Meds:.acetaminophen **OR** acetaminophen, albuterol, bisacodyl, bismuth subsalicylate, fluticasone, HYDROcodone-acetaminophen, ondansetron **OR** ondansetron (ZOFRAN) IV, senna-docusate   Data Review:   Micro Results Recent Results (from the past 240 hour(s))  SARS CORONAVIRUS 2 (TAT 6-24 HRS) Nasopharyngeal Nasopharyngeal Swab     Status: None   Collection Time: 05/22/19  2:30 PM   Specimen: Nasopharyngeal Swab  Result Value Ref Range Status   SARS Coronavirus 2 NEGATIVE NEGATIVE Final    Comment: (NOTE) SARS-CoV-2 target nucleic acids are NOT DETECTED. The SARS-CoV-2 RNA is generally detectable in upper and lower respiratory specimens during the  acute phase of infection. Negative results do not preclude SARS-CoV-2 infection, do not rule out co-infections with other pathogens, and should not be used as the sole basis for treatment or other patient management decisions. Negative results must be combined with clinical observations, patient history, and epidemiological information. The expected result is Negative. Fact Sheet for Patients: SugarRoll.be Fact Sheet for Healthcare Providers: https://www.woods-mathews.com/ This test is not yet approved or cleared by the [place] FDA and  has been authorized for detection and/or diagnosis of SARS-CoV-2 by FDA under an Emergency Use Authorization (EUA). This EUA will remain  in effect (meaning this test can be used) for the duration of the COVID-19 declaration under Section 56 4(b)(1) of the Act, 21 U.S.C. section 360bbb-3(b)(1), unless the authorization is terminated or revoked sooner. Performed at [facility], [place] [address], [place], [place] [address]     Radiology Reports Ct Head Wo Contrast  Result Date: 05/22/2019 CLINICAL DATA:  Acute altered level of consciousness. EXAM: CT HEAD WITHOUT CONTRAST TECHNIQUE: Contiguous axial images were obtained from the base of the skull through the vertex without intravenous contrast. COMPARISON:  03/12/2018 FINDINGS: Brain: No evidence of acute infarction, hemorrhage, hydrocephalus, extra-axial collection or mass lesion/mass effect. There is diffuse cerebral cortical atrophy with secondary ventricular dilatation, progressed since the prior study. The atrophy is most severe in the temporal lobes. 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 Vascular: No hyperdense vessel or unexpected calcification. Skull: Normal. Negative for fracture or focal lesion. Sinuses/Orbits: Normal. Other: None IMPRESSION: No acute intracranial abnormality. Progressive atrophy, most severe in the temporal  lobes. 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 Electronically Signed   By: Lorriane Shire M.D.   On: 05/22/2019 15:41   Dg Chest Portable 1 View  Result Date: 05/22/2019 CLINICAL DATA:  Altered mental status. Evaluate for pneumonia, CHF. EXAM: PORTABLE CHEST 1 VIEW COMPARISON:  Chest x-ray dated  09/17/2017. FINDINGS: Study is hypoinspiratory with crowding of the perihilar bronchovascular markings. Subtle opacities within the bilateral upper lobes may also be attributable to the low lung volumes in combination with superimposed osseous structures. No confluent opacity within the lower lungs to suggest a developing pneumonia or pulmonary edema. No pleural effusion or pneumothorax is seen. Osseous structures about the chest are unremarkable. IMPRESSION: Low lung volumes. Subtle opacities within the bilateral upper lobes may be crowded bronchovascular markings secondary to the low lung volumes, with superimposed osseous structures, but early developing pneumonia cannot be excluded. If possible, would consider repeat chest x-ray with better inspiratory effort. No evidence of pneumonia or pulmonary edema within the mid or lower lung zones. Electronically Signed   By: Franki Cabot M.D.   On: 05/22/2019 14:52     CBC Recent Labs  Lab 05/22/19 1418 05/23/19 0455  WBC 10.6* 10.2  HGB 9.9* 9.3*  HCT 32.1* 29.7*  PLT 183 156  MCV 94.4 92.8  MCH 29.1 29.1  MCHC 30.8 31.3  RDW 12.8 12.7  LYMPHSABS 2.2  --   MONOABS 1.0  --   EOSABS 0.3  --   BASOSABS 0.0  --     Chemistries  Recent Labs  Lab 05/22/19 1418 05/23/19 0455  NA 141 141  K 4.8 3.9  CL 103 105  CO2 31 29  GLUCOSE 87 84  BUN 32* 29*  CREATININE 1.11* 0.66  CALCIUM 8.7* 8.7*  AST 22  --   ALT <5  --   ALKPHOS 35*  --   BILITOT 0.7  --    ------------------------------------------------------------------------------------------------------------------ estimated creatinine clearance is 42.5 mL/min (by C-G formula based  on SCr of 0.66 mg/dL). ------------------------------------------------------------------------------------------------------------------ No results for input(s): HGBA1C in the last 72 hours. ------------------------------------------------------------------------------------------------------------------ No results for input(s): CHOL, HDL, LDLCALC, TRIG, CHOLHDL, LDLDIRECT in the last 72 hours. ------------------------------------------------------------------------------------------------------------------ No results for input(s): TSH, T4TOTAL, T3FREE, THYROIDAB in the last 72 hours.  Invalid input(s): FREET3 ------------------------------------------------------------------------------------------------------------------ No results for input(s): VITAMINB12, FOLATE, FERRITIN, TIBC, IRON, RETICCTPCT in the last 72 hours.  Coagulation profile No results for input(s): INR, PROTIME in the last 168 hours.  No results for input(s): DDIMER in the last 72 hours.  Cardiac Enzymes No results for input(s): CKMB, TROPONINI, MYOGLOBIN in the last 168 hours.  Invalid input(s): CK ------------------------------------------------------------------------------------------------------------------ Invalid input(s): [place]  Patient is 85 year old presenting with altered mental status   Acute metabolic encephalopathy due to UTI and dehydration. The patient will be admitted to medical floor. Continue Rocephin   Bradycardia.  Improved with atropine.  Hold Coreg, telemetry monitor.  Hypertension.  Continue home hypertension medication.   Dementia.  Aspiration fall precaution.  All the records are reviewed and case discussed with ED provider. Management plans discussed with the patient, family and they are in agreement.      Code Status Orders  (From admission, onward)         Start     Ordered   05/22/19 1654  Do not attempt resuscitation (DNR)  Continuous     Question Answer Comment  In the event of cardiac or respiratory ARREST Do not call a "code blue"   In the event of cardiac or respiratory ARREST Do not perform Intubation, CPR, defibrillation or ACLS   In the event of cardiac or respiratory ARREST Use medication by any route, position, wound care, and other measures to relive pain and suffering. May use oxygen, suction and manual treatment of airway obstruction  as needed for comfort.      05/22/19 1654        Code Status History    Date Active Date Inactive Code Status Order ID Comments User Context   05/22/2019 [id number] 05/22/2019 1654 DNR [id number]  Merlyn Lot, MD ED   Advance Care Planning Activity           Consults  none  DVT Prophylaxis  Lovenox   Lab Results  Component Value Date   PLT 156 05/23/2019     Time Spent in minutes   58min Greater than 50% of time spent in care coordination and counseling patient regarding the condition and plan of care.   Dustin Flock M.D on 05/23/2019 at 1:47 PM  Between 7am to 6pm - Pager - [phone]  After 6pm go to [email or web address] - Proofreader  [employer]   Office  [phone]

## 2019-05-24 MED ORDER — CARVEDILOL 6.25 MG PO TABS
6.2500 mg | ORAL_TABLET | Freq: Two times a day (BID) | ORAL | Status: DC
  Administered 2019-05-24: 6.25 mg via ORAL
  Filled 2019-05-24: qty 1

## 2019-05-24 NOTE — Progress Notes (Signed)
[place] at [facility]                                                                                                                                                                                  Patient Demographics   Melissa Combs, is a 85 y.o. female, DOB - August 14, 1930, [id number]  Admit date - 05/22/2019   Admitting Physician Demetrios Loll, MD  Outpatient Primary MD for the patient is Lada, Satira Anis, MD   LOS - 2  Subjective: Continues to confuse confused which is her baseline    Review of Systems:   CONSTITUTIONAL: Dementia  Vitals:   Vitals:   05/23/19 1201 05/23/19 2002 05/24/19 0512 05/24/19 1203  BP: 139/83 (!) 151/66 (!) 171/75 (!) 156/65  Pulse: 78 70 68 63  Resp: 18 20 18    Temp: (!) 97.4 F (36.3 C) 98.2 F (36.8 C) (!) 97.5 F (36.4 C) 98.3 F (36.8 C)  TempSrc: Oral Oral Oral Oral  SpO2: 100% 98% 100% 100%  Weight:      Height:        Wt Readings from Last 3 Encounters:  05/22/19 54.4 kg  03/06/19 59.9 kg  02/20/19 62.6 kg     Intake/Output Summary (Last 24 hours) at 05/24/2019 1345 Last data filed at 05/24/2019 1315 Gross per 24 hour  Intake 187.2 ml  Output 1200 ml  Net -1012.8 ml    Physical Exam:   GENERAL: Pleasant-appearing in no apparent distress.  HEAD, EYES, EARS, NOSE AND THROAT: Atraumatic, normocephalic. Extraocular muscles are intact. Pupils equal and reactive to light. Sclerae anicteric. No conjunctival injection. No oro-pharyngeal erythema.  NECK: Supple. There is no jugular venous distention. No bruits, no lymphadenopathy, no thyromegaly.  HEART: Regular rate and rhythm,. No murmurs, no rubs, no clicks.  LUNGS: Clear to auscultation bilaterally. No rales or rhonchi. No wheezes.  ABDOMEN: Soft, flat, nontender, nondistended. Has good bowel sounds. No hepatosplenomegaly appreciated.  EXTREMITIES: No evidence of any cyanosis, clubbing, or peripheral edema.  +2 pedal and radial pulses bilaterally.   NEUROLOGIC: The patient is alert, not oriented  SKIN: Moist and warm with no rashes appreciated.  Psych: Not anxious, depressed confused at baseline LN: No inguinal LN enlargement    Antibiotics   Anti-infectives (From admission, onward)   Start     Dose/Rate Route Frequency Ordered Stop   05/23/19 1500  cefTRIAXone (ROCEPHIN) 1 g in sodium chloride 0.9 % 100 mL IVPB     1 g 200 mL/hr over 30 Minutes Intravenous Every 24 hours 05/22/19 1654     05/22/19 1515  cefTRIAXone (ROCEPHIN) 1 g in sodium chloride 0.9 % 100 mL IVPB  1 g 200 mL/hr over 30 Minutes Intravenous  Once 05/22/19 1508 05/22/19 1542      Medications   Scheduled Meds: . amLODipine  5 mg Oral Daily  . anastrozole  1 mg Oral Daily  . aspirin  81 mg Oral Daily  . brimonidine  1 drop Both Eyes BID   And  . timolol  1 drop Both Eyes BID  . calcium-vitamin D  1 tablet Oral BID  . carvedilol  6.25 mg Oral BID WC  . citalopram  10 mg Oral Daily  . divalproex  125 mg Oral Q12H  . enoxaparin (LOVENOX) injection  40 mg Subcutaneous Q24H  . latanoprost  1 drop Both Eyes QHS  . lisinopril  20 mg Oral Daily  . pantoprazole  40 mg Oral Daily  . pravastatin  40 mg Oral Daily  . vitamin B-12  500 mcg Oral Daily   Continuous Infusions: . sodium chloride 75 mL/hr at 05/24/19 0143  . cefTRIAXone (ROCEPHIN)  IV 200 mL/hr at 05/23/19 1547   PRN Meds:.acetaminophen **OR** acetaminophen, albuterol, bisacodyl, bismuth subsalicylate, fluticasone, HYDROcodone-acetaminophen, ondansetron **OR** ondansetron (ZOFRAN) IV, senna-docusate   Data Review:   Micro Results Recent Results (from the past 240 hour(s))  Urine Culture     Status: Abnormal (Preliminary result)   Collection Time: 05/22/19  1:55 PM   Specimen: Urine, Random  Result Value Ref Range Status   Specimen Description   Final    URINE, RANDOM Performed at [facility], [address], [place], [place] [address]    Special Requests   Final     NONE Performed at [facility], [place] [place], [place] [address]    Culture (A)  Final    >=100,000 COLONIES/mL KLEBSIELLA PNEUMONIAE 20,000 COLONIES/mL ESCHERICHIA COLI CULTURE REINCUBATED FOR BETTER GROWTH Performed at [facility], [place] [address] [place], [place] [address]    Report Status PENDING  Incomplete  SARS CORONAVIRUS 2 (TAT 6-24 HRS) Nasopharyngeal Nasopharyngeal Swab     Status: None   Collection Time: 05/22/19  2:30 PM   Specimen: Nasopharyngeal Swab  Result Value Ref Range Status   SARS Coronavirus 2 NEGATIVE NEGATIVE Final    Comment: (NOTE) SARS-CoV-2 target nucleic acids are NOT DETECTED. The SARS-CoV-2 RNA is generally detectable in upper and lower respiratory specimens during the acute phase of infection. Negative results do not preclude SARS-CoV-2 infection, do not rule out co-infections with other pathogens, and should not be used as the sole basis for treatment or other patient management decisions. Negative results must be combined with clinical observations, patient history, and epidemiological information. The expected result is Negative. Fact Sheet for Patients: SugarRoll.be Fact Sheet for Healthcare Providers: https://www.woods-mathews.com/ This test is not yet approved or cleared by the [place] FDA and  has been authorized for detection and/or diagnosis of SARS-CoV-2 by FDA under an Emergency Use Authorization (EUA). This EUA will remain  in effect (meaning this test can be used) for the duration of the COVID-19 declaration under Section 56 4(b)(1) of the Act, 21 U.S.C. section 360bbb-3(b)(1), unless the authorization is terminated or revoked sooner. Performed at [facility], [place] [address] [place], [place] [address]     Radiology Reports Ct Head Wo Contrast  Result Date: 05/22/2019 CLINICAL DATA:  Acute altered level of consciousness. EXAM: CT HEAD WITHOUT  CONTRAST TECHNIQUE: Contiguous axial images were obtained from the base of the skull through the vertex without intravenous contrast. COMPARISON:  03/12/2018 FINDINGS:  Brain: No evidence of acute infarction, hemorrhage, hydrocephalus, extra-axial collection or mass lesion/mass effect. There is diffuse cerebral cortical atrophy with secondary ventricular dilatation, progressed since the prior study. The atrophy is most severe in the temporal lobes. 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 Vascular: No hyperdense vessel or unexpected calcification. Skull: Normal. Negative for fracture or focal lesion. Sinuses/Orbits: Normal. Other: None IMPRESSION: No acute intracranial abnormality. Progressive atrophy, most severe in the temporal lobes. 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 Electronically Signed   By: Lorriane Shire M.D.   On: 05/22/2019 15:41   Dg Chest Portable 1 View  Result Date: 05/22/2019 CLINICAL DATA:  Altered mental status. Evaluate for pneumonia, CHF. EXAM: PORTABLE CHEST 1 VIEW COMPARISON:  Chest x-ray dated 09/17/2017. FINDINGS: Study is hypoinspiratory with crowding of the perihilar bronchovascular markings. Subtle opacities within the bilateral upper lobes may also be attributable to the low lung volumes in combination with superimposed osseous structures. No confluent opacity within the lower lungs to suggest a developing pneumonia or pulmonary edema. No pleural effusion or pneumothorax is seen. Osseous structures about the chest are unremarkable. IMPRESSION: Low lung volumes. Subtle opacities within the bilateral upper lobes may be crowded bronchovascular markings secondary to the low lung volumes, with superimposed osseous structures, but early developing pneumonia cannot be excluded. If possible, would consider repeat chest x-ray with better inspiratory effort. No evidence of pneumonia or pulmonary edema within the mid or lower lung zones. Electronically Signed    By: Franki Cabot M.D.   On: 05/22/2019 14:52     CBC Recent Labs  Lab 05/22/19 1418 05/23/19 0455  WBC 10.6* 10.2  HGB 9.9* 9.3*  HCT 32.1* 29.7*  PLT 183 156  MCV 94.4 92.8  MCH 29.1 29.1  MCHC 30.8 31.3  RDW 12.8 12.7  LYMPHSABS 2.2  --   MONOABS 1.0  --   EOSABS 0.3  --   BASOSABS 0.0  --     Chemistries  Recent Labs  Lab 05/22/19 1418 05/23/19 0455  NA 141 141  K 4.8 3.9  CL 103 105  CO2 31 29  GLUCOSE 87 84  BUN 32* 29*  CREATININE 1.11* 0.66  CALCIUM 8.7* 8.7*  AST 22  --   ALT <5  --   ALKPHOS 35*  --   BILITOT 0.7  --    ------------------------------------------------------------------------------------------------------------------ estimated creatinine clearance is 42.5 mL/min (by C-G formula based on SCr of 0.66 mg/dL). ------------------------------------------------------------------------------------------------------------------ No results for input(s): HGBA1C in the last 72 hours. ------------------------------------------------------------------------------------------------------------------ No results for input(s): CHOL, HDL, LDLCALC, TRIG, CHOLHDL, LDLDIRECT in the last 72 hours. ------------------------------------------------------------------------------------------------------------------ No results for input(s): TSH, T4TOTAL, T3FREE, THYROIDAB in the last 72 hours.  Invalid input(s): FREET3 ------------------------------------------------------------------------------------------------------------------ No results for input(s): VITAMINB12, FOLATE, FERRITIN, TIBC, IRON, RETICCTPCT in the last 72 hours.  Coagulation profile No results for input(s): INR, PROTIME in the last 168 hours.  No results for input(s): DDIMER in the last 72 hours.  Cardiac Enzymes No results for input(s): CKMB, TROPONINI, MYOGLOBIN in the last 168 hours.  Invalid input(s):  CK ------------------------------------------------------------------------------------------------------------------ Invalid input(s): [place]  Patient is 85 year old presenting with altered mental status   Acute metabolic encephalopathy due to UTI and dehydration. Continue Rocephin  Urine cultures greater than 100,000 Klebsiella pneumonia await sensitivities  Bradycardia.  Improved with atropine.  Hold Coreg, telemetry monitor.  Hypertension.  Continue home hypertension medication.  Dementia.  Continue supportive care  All the  records are reviewed and case discussed with ED provider. Management plans discussed with the patient, family and they are in agreement.      Code Status Orders  (From admission, onward)         Start     Ordered   05/22/19 1654  Do not attempt resuscitation (DNR)  Continuous    Question Answer Comment  In the event of cardiac or respiratory ARREST Do not call a "code blue"   In the event of cardiac or respiratory ARREST Do not perform Intubation, CPR, defibrillation or ACLS   In the event of cardiac or respiratory ARREST Use medication by any route, position, wound care, and other measures to relive pain and suffering. May use oxygen, suction and manual treatment of airway obstruction as needed for comfort.      05/22/19 1654        Code Status History    Date Active Date Inactive Code Status Order ID Comments User Context   05/22/2019 [id number] 05/22/2019 1654 DNR [id number]  Merlyn Lot, MD ED   Advance Care Planning Activity           Consults  none  DVT Prophylaxis  Lovenox   Lab Results  Component Value Date   PLT 156 05/23/2019     Time Spent in minutes   30min Greater than 50% of time spent in care coordination and counseling patient regarding the condition and plan of care.   Dustin Flock M.D on 05/24/2019 at 1:45 PM  Between 7am to 6pm - Pager - [phone]  After 6pm go to [email or web address] -  Proofreader  [employer]   Office  [phone]

## 2019-05-24 NOTE — Progress Notes (Signed)
PT Cancellation Note  Patient Details Name: Melissa Combs MRN: [id number] DOB: [birth year]   Cancelled Treatment:    Reason Eval/Treat Not Completed: Other (comment)  PT entered room, pt awake and speaks to PT but unable to answer any questions appropriately, unable to state her name. PT attempted visual/tactile cueing to assess pt's mobility. Initially pt unwilling to let PT remove covers, but with re-direction pt agreeable.  Supine to sit maxA with pt actively attempting to return to supine despite encouragement/multimodal cues. Pt unable to follow simple commands at this time.   Lieutenant Diego PT, DPT 10:32 AM,05/24/19 [phone]

## 2019-05-24 NOTE — NC FL2 (Signed)
[place] LEVEL OF CARE SCREENING TOOL     IDENTIFICATION  Patient Name: Melissa Combs Birthdate: [birth year] Sex: female Admission Date (Current Location): 05/22/2019  [facility] and [place] Number:  Engineering geologist and Address:         Provider Number: [id number]  Attending Physician Name and Address:  Dustin Flock, MD  Relative Name and Phone Number:       Current Level of Care: Hospital Recommended Level of Care: Nursing Facility Prior Approval Number:    Date Approved/Denied:   PASRR Number: [id number]  Discharge Plan: SNF(Long term care)    Current Diagnoses: Patient Active Problem List   Diagnosis Date Noted  . AMS (altered mental status) 05/22/2019  . Malignant neoplasm of upper-outer quadrant of left breast in female, estrogen receptor positive ([place]) 04/15/2018  . Goals of care, counseling/discussion 04/15/2018  . Dysphagia 11/26/2017  . PAC (premature atrial contraction) 09/02/2017  . Chronic pain syndrome 07/08/2017  . Urinary incontinence 10/03/2016  . Allergic rhinitis 11/29/2015  . B12 deficiency 11/06/2015  . Osteoporosis screening 11/01/2015  . Hypochloremia 09/29/2015  . Hyponatremia 09/29/2015  . Medication monitoring encounter 09/27/2015  . Bilateral leg edema 09/27/2015  . Dyslipidemia 06/27/2015  . CKD (chronic kidney disease) stage 3, GFR 30-59 ml/min (HCC) 03/28/2015  . Glaucoma   . Hypertension   . Dementia ([place])   . Anemia   . Arthritis   . Chronic constipation   . Postherpetic neuralgia   . Post herpetic neuralgia head 01/13/2015  . Bilateral occipital neuralgia 01/13/2015  . DDD (degenerative disc disease), lumbar 01/13/2015  . DJD (degenerative joint disease) of knee 01/13/2015  . TIA (transient ischemic attack) 10/01/2014    Orientation RESPIRATION BLADDER Height & Weight     Self  Normal Incontinent Weight: 54.4 kg Height:  5\' 6"  (167.6 cm)  BEHAVIORAL SYMPTOMS/MOOD NEUROLOGICAL BOWEL NUTRITION  STATUS      Incontinent Diet(Regular)  AMBULATORY STATUS COMMUNICATION OF NEEDS Skin   Extensive Assist Verbally(with difficulity) Normal                       Personal Care Assistance Level of Assistance  Bathing, Feeding, Dressing Bathing Assistance: Maximum assistance Feeding assistance: Limited assistance Dressing Assistance: Maximum assistance     Functional Limitations Info  Hearing, Speech   Hearing Info: Impaired Speech Info: Impaired    SPECIAL CARE FACTORS FREQUENCY                       Contractures Contractures Info: Not present    Additional Factors Info  Code Status, Allergies Code Status Info: DNR Allergies Info: NKDA           Current Medications (05/24/2019):  This is the current hospital active medication list Current Facility-Administered Medications  Medication Dose Route Frequency Provider Last Rate Last Dose  . 0.9 %  sodium chloride infusion   Intravenous Continuous Demetrios Loll, MD 75 mL/hr at 05/24/19 0143    . acetaminophen (TYLENOL) tablet 650 mg  650 mg Oral Q6H PRN Demetrios Loll, MD       Or  . acetaminophen (TYLENOL) suppository 650 mg  650 mg Rectal Q6H PRN Demetrios Loll, MD      . albuterol (PROVENTIL) (2.5 MG/3ML) 0.083% nebulizer solution 2.5 mg  2.5 mg Nebulization Q2H PRN Demetrios Loll, MD      . amLODipine (NORVASC) tablet 5 mg  5 mg Oral Daily Demetrios Loll, MD   5  mg at 05/24/19 1138  . anastrozole (ARIMIDEX) tablet 1 mg  1 mg Oral Daily Demetrios Loll, MD   1 mg at 05/24/19 1139  . aspirin chewable tablet 81 mg  81 mg Oral Daily Demetrios Loll, MD   81 mg at 05/24/19 1139  . bisacodyl (DULCOLAX) EC tablet 5 mg  5 mg Oral Daily PRN Demetrios Loll, MD      . bismuth subsalicylate (PEPTO BISMOL) 262 MG/15ML suspension 15 mL  15 mL Oral Q2H PRN Demetrios Loll, MD      . brimonidine (ALPHAGAN) 0.2 % ophthalmic solution 1 drop  1 drop Both Eyes BID Dustin Flock, MD   1 drop at 05/24/19 1140   And  . timolol (TIMOPTIC) 0.5 % ophthalmic solution 1 drop   1 drop Both Eyes BID Dustin Flock, MD   1 drop at 05/24/19 1140  . calcium-vitamin D (OSCAL WITH D) 500-200 MG-UNIT per tablet 1 tablet  1 tablet Oral BID Demetrios Loll, MD   1 tablet at 05/24/19 1139  . carvedilol (COREG) tablet 6.25 mg  6.25 mg Oral BID WC Dustin Flock, MD      . cefTRIAXone (ROCEPHIN) 1 g in sodium chloride 0.9 % 100 mL IVPB  1 g Intravenous Q24H Demetrios Loll, MD 200 mL/hr at 05/23/19 1547    . citalopram (CELEXA) tablet 10 mg  10 mg Oral Daily Demetrios Loll, MD   10 mg at 05/24/19 1139  . divalproex (DEPAKOTE SPRINKLE) capsule 125 mg  125 mg Oral Q12H Demetrios Loll, MD   125 mg at 05/24/19 1140  . enoxaparin (LOVENOX) injection 40 mg  40 mg Subcutaneous Q24H Demetrios Loll, MD   40 mg at 05/23/19 2151  . fluticasone (FLONASE) 50 MCG/ACT nasal spray 2 spray  2 spray Each Nare Daily PRN Demetrios Loll, MD      . HYDROcodone-acetaminophen (NORCO/VICODIN) 5-325 MG per tablet 1-2 tablet  1-2 tablet Oral Q4H PRN Demetrios Loll, MD      . latanoprost (XALATAN) 0.005 % ophthalmic solution 1 drop  1 drop Both Eyes QHS Demetrios Loll, MD   1 drop at 05/23/19 2154  . lisinopril (ZESTRIL) tablet 20 mg  20 mg Oral Daily Demetrios Loll, MD   20 mg at 05/24/19 1138  . ondansetron (ZOFRAN) tablet 4 mg  4 mg Oral Q6H PRN Demetrios Loll, MD       Or  . ondansetron [facility]) injection 4 mg  4 mg Intravenous Q6H PRN Demetrios Loll, MD      . pantoprazole (PROTONIX) EC tablet 40 mg  40 mg Oral Daily Demetrios Loll, MD   40 mg at 05/24/19 1139  . pravastatin (PRAVACHOL) tablet 40 mg  40 mg Oral Daily Demetrios Loll, MD   40 mg at 05/24/19 1138  . senna-docusate (Senokot-S) tablet 1 tablet  1 tablet Oral QHS PRN Demetrios Loll, MD      . vitamin B-12 (CYANOCOBALAMIN) tablet 500 mcg  500 mcg Oral Daily Demetrios Loll, MD   500 mcg at 05/24/19 1139     Discharge Medications: Please see discharge summary for a list of discharge medications.  Relevant Imaging Results:  Relevant Lab Results:   Additional Information ss [id number]  Beverly Sessions, RN

## 2019-05-24 NOTE — TOC Initial Note (Signed)
Transition of Care [facility]) - Initial/Assessment Note    Patient Details  Name: Melissa Combs MRN: [id number] Date of Birth: 1929/10/07  [facility]) CM/SW Contact:    Beverly Sessions, RN Phone Number: 05/24/2019, 3:37 PM  Clinical Narrative:                  Patient admitted with AMS Patient with history of dementia  Patient alert to self Assessment completed with daughter via phone  Daughter resides in [place].   Patient has lived at [place] since 2018.  RNCM spoke with care giver named Joy at [facility].  She states they will be unable to accept patient back at discharge.  States that patient has been a 2 person assist for all care, and they do not have the staffing means to support that.  RNCM left message with director of facility [place] at [phone]  Daughter confirms that the director has spoken with her and already informed her they would be unable to meet her needs at discharge  PT has assessed patient.  Patient was unable to follow commands, and not appropriate for SNF at this time.   Daughter in agreement for bed search to be extended to [employer] and [employer] for long term care Medicaid bed. PASRR obtained FL2 sent for signature Bed search initiated  RNCM and daughter discussed what disposition would be if there are no bed offers available.   Daughter states the only option would be for the patient to move to [place] with her, as there are no other family members that are available to care for her.    RNCM to follow up with daughter tomorrow         Patient Goals and CMS Choice        Expected Discharge Plan and Services                                                Prior Living Arrangements/Services                    Criminal Activity/Legal Involvement Pertinent to Current Situation/Hospitalization: No - Comment as needed  Activities of Daily Living Home Assistive Devices/Equipment:  None ADL Screening (condition at time of admission) Patient's cognitive ability adequate to safely complete daily activities?: No Is the patient deaf or have difficulty hearing?: No Does the patient have difficulty seeing, even when wearing glasses/contacts?: No Does the patient have difficulty concentrating, remembering, or making decisions?: Yes Patient able to express need for assistance with ADLs?: No Does the patient have difficulty dressing or bathing?: Yes Independently performs ADLs?: No Communication: Independent Dressing (OT): Needs assistance Is this a change from baseline?: Pre-admission baseline Grooming: Needs assistance Is this a change from baseline?: Pre-admission baseline Feeding: Needs assistance Is this a change from baseline?: Pre-admission baseline Bathing: Needs assistance Is this a change from baseline?: Pre-admission baseline Toileting: Needs assistance Is this a change from baseline?: Pre-admission baseline In/Out Bed: Dependent Is this a change from baseline?: Pre-admission baseline Walks in Home: Dependent Is this a change from baseline?: Pre-admission baseline Does the patient have difficulty walking or climbing stairs?: Yes Weakness of Legs: Both Weakness of Arms/Hands: Both  Permission Sought/Granted                  Emotional Assessment  Orientation: : Oriented to Self      Admission diagnosis:  Acute cystitis with hematuria [N30.01] Acute encephalopathy [G93.40] AKI (acute kidney injury) ([place]) [N17.9] Patient Active Problem List   Diagnosis Date Noted  . AMS (altered mental status) 05/22/2019  . Malignant neoplasm of upper-outer quadrant of left breast in female, estrogen receptor positive ([place]) 04/15/2018  . Goals of care, counseling/discussion 04/15/2018  . Dysphagia 11/26/2017  . PAC (premature atrial contraction) 09/02/2017  . Chronic pain syndrome 07/08/2017  . Urinary incontinence 10/03/2016  . Allergic rhinitis  11/29/2015  . B12 deficiency 11/06/2015  . Osteoporosis screening 11/01/2015  . Hypochloremia 09/29/2015  . Hyponatremia 09/29/2015  . Medication monitoring encounter 09/27/2015  . Bilateral leg edema 09/27/2015  . Dyslipidemia 06/27/2015  . CKD (chronic kidney disease) stage 3, GFR 30-59 ml/min (HCC) 03/28/2015  . Glaucoma   . Hypertension   . Dementia ([place])   . Anemia   . Arthritis   . Chronic constipation   . Postherpetic neuralgia   . Post herpetic neuralgia head 01/13/2015  . Bilateral occipital neuralgia 01/13/2015  . DDD (degenerative disc disease), lumbar 01/13/2015  . DJD (degenerative joint disease) of knee 01/13/2015  . TIA (transient ischemic attack) 10/01/2014   PCP:  [name], MD Pharmacy:   [facility], [address] [place] [address] Phone: [phone] Fax: [phone]     Social Determinants of Health (SDOH) Interventions    Readmission Risk Interventions No flowsheet data found.

## 2019-05-25 DIAGNOSIS — G9340 Encephalopathy, unspecified: Secondary | ICD-10-CM | POA: Diagnosis present

## 2019-05-25 DIAGNOSIS — Z7401 Bed confinement status: Secondary | ICD-10-CM | POA: Diagnosis not present

## 2019-05-25 DIAGNOSIS — R1312 Dysphagia, oropharyngeal phase: Secondary | ICD-10-CM | POA: Diagnosis present

## 2019-05-25 DIAGNOSIS — N179 Acute kidney failure, unspecified: Secondary | ICD-10-CM | POA: Diagnosis present

## 2019-05-25 DIAGNOSIS — R2689 Other abnormalities of gait and mobility: Secondary | ICD-10-CM | POA: Diagnosis present

## 2019-05-25 DIAGNOSIS — Z741 Need for assistance with personal care: Secondary | ICD-10-CM | POA: Diagnosis present

## 2019-05-25 DIAGNOSIS — I679 Cerebrovascular disease, unspecified: Secondary | ICD-10-CM | POA: Diagnosis not present

## 2019-05-25 DIAGNOSIS — R404 Transient alteration of awareness: Secondary | ICD-10-CM | POA: Diagnosis not present

## 2019-05-25 DIAGNOSIS — M6281 Muscle weakness (generalized): Secondary | ICD-10-CM | POA: Diagnosis present

## 2019-05-25 DIAGNOSIS — N3001 Acute cystitis with hematuria: Secondary | ICD-10-CM | POA: Diagnosis present

## 2019-05-25 DIAGNOSIS — G9341 Metabolic encephalopathy: Secondary | ICD-10-CM | POA: Diagnosis not present

## 2019-05-25 DIAGNOSIS — R41841 Cognitive communication deficit: Secondary | ICD-10-CM | POA: Diagnosis present

## 2019-05-25 DIAGNOSIS — F0390 Unspecified dementia without behavioral disturbance: Secondary | ICD-10-CM | POA: Diagnosis not present

## 2019-05-25 DIAGNOSIS — R410 Disorientation, unspecified: Secondary | ICD-10-CM | POA: Diagnosis not present

## 2019-05-25 DIAGNOSIS — E860 Dehydration: Secondary | ICD-10-CM | POA: Diagnosis not present

## 2019-05-25 DIAGNOSIS — I10 Essential (primary) hypertension: Secondary | ICD-10-CM | POA: Diagnosis not present

## 2019-05-25 DIAGNOSIS — F0150 Vascular dementia without behavioral disturbance: Secondary | ICD-10-CM | POA: Diagnosis not present

## 2019-05-25 DIAGNOSIS — M2550 Pain in unspecified joint: Secondary | ICD-10-CM | POA: Diagnosis not present

## 2019-05-25 DIAGNOSIS — N390 Urinary tract infection, site not specified: Secondary | ICD-10-CM | POA: Diagnosis not present

## 2019-05-25 LAB — MRSA PCR SCREENING: MRSA by PCR: NEGATIVE

## 2019-05-25 MED ORDER — CEFDINIR 300 MG PO CAPS: 300.0000 mg | ORAL_CAPSULE | Freq: Two times a day (BID) | ORAL | 0 refills | Status: AC

## 2019-05-25 MED ORDER — CEFDINIR 300 MG PO CAPS
300.0000 mg | ORAL_CAPSULE | Freq: Two times a day (BID) | ORAL | Status: DC
  Administered 2019-05-25: 300 mg via ORAL
  Filled 2019-05-25 (×2): qty 1

## 2019-05-25 NOTE — Care Management Important Message (Signed)
Important Message  Patient Details  Name: Melissa Combs MRN: [id number] Date of Birth: [birth year]   Medicare Important Message Given:  Yes     Beverly Sessions, RN 05/25/2019, 12:04 PM

## 2019-05-25 NOTE — TOC Transition Note (Signed)
Transition of Care [facility]) - CM/SW Discharge Note   Patient Details  Name: Melissa Combs MRN: [id number] Date of Birth: June 25, 1930  [facility] Care [facility]) CM/SW Contact:  Beverly Sessions, RN Phone Number: 05/25/2019, 4:36 PM   Clinical Narrative:    Daughter selected [place] at [facility]  Patient to discharge today to [place] at [facility] info sent in hub  Discharge packet on chart.  Bedside RN notified   Final next level of care: Long Term Nursing Home Barriers to Discharge: No Barriers Identified   Patient Goals and CMS Choice        Discharge Placement              Patient chooses bed at: Other - please specify in the comment section below:([facility])   Name of family member notified: Daughter Patient and family notified of of transfer: 05/25/19  Discharge Plan and Services                                     Social Determinants of Health (SDOH) Interventions     Readmission Risk Interventions No flowsheet data found.

## 2019-05-25 NOTE — Progress Notes (Addendum)
PT Cancellation Note  Patient Details Name: Melissa Combs MRN: [id number] DOB: Oct 31, 1929   Cancelled Treatment:    Reason Eval/Treat Not Completed: (Pt with improved ability to follow simple commands this morning, but still unable to participate in meaningful PT evaluation. PT to sign off, please re-consult if pt mental status improves/any acute needs arise.)    Lieutenant Diego PT, DPT 11:11 AM,05/25/19 [phone]

## 2019-05-25 NOTE — Discharge Summary (Addendum)
[employer] - Melissa Combs at [facility], 85 y.o., DOB 02-Jan-1930, MRN [id number]. Admission date: 05/22/2019 Discharge Date 05/25/2019 Primary MD [name], MD Admitting Physician Demetrios Loll, MD  Admission Diagnosis  Acute cystitis with hematuria [N30.01] Acute encephalopathy [G93.40] AKI (acute kidney injury) [facility]) [N17.9]  Discharge Diagnosis   Active Problems: Acute metabolic encephalopathy due to UTI has a result of Klebsiella pneumonia And bradycardia Essential hypertension Dementia  History of TIA Glaucoma Melissa Combs  is a 85 y.o. female with a known history of multiple medical problems as below.  The patient is sent to ED due to unresponsiveness.  She was found bradycardia at the 30s, given atropine with improvement in her heart rate.  She is confused, unable to provide information.  She is found UTI and dehydration, treated with Rocephin and IV fluids in the ED. patient's was treated with antibiotics.  She has baseline confusion and that is unchanged.  She is currently stable heart rate is been stable.  Will discontinue Coreg on discharge.  Her urine culture showed Klebsiella pneumonia sensitive to cephalosporins.   [place] for 3 more days          Consults  None  Significant Tests:  See full reports for all details     Ct Head Wo Contrast  Result Date: 05/22/2019 CLINICAL DATA:  Acute altered level of consciousness. EXAM: CT HEAD WITHOUT CONTRAST TECHNIQUE: Contiguous axial images were obtained from the base of the skull through the vertex without intravenous contrast. COMPARISON:  03/12/2018 FINDINGS: Brain: No evidence of acute infarction, hemorrhage, hydrocephalus, extra-axial collection or mass lesion/mass effect. There is diffuse cerebral cortical atrophy with secondary ventricular dilatation, progressed since the prior study. The atrophy is most severe in the temporal lobes. There is an old  subcortical infarct in the left frontal lobe. Vascular: No hyperdense vessel or unexpected calcification. Skull: Normal. Negative for fracture or focal lesion. Sinuses/Orbits: Normal. Other: None IMPRESSION: No acute intracranial abnormality. Progressive atrophy, most severe in the temporal lobes. 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 Electronically Signed   By: Lorriane Shire M.D.   On: 05/22/2019 15:41   Dg Chest Portable 1 View  Result Date: 05/22/2019 CLINICAL DATA:  Altered mental status. Evaluate for pneumonia, CHF. EXAM: PORTABLE CHEST 1 VIEW COMPARISON:  Chest x-ray dated 09/17/2017. FINDINGS: Study is hypoinspiratory with crowding of the perihilar bronchovascular markings. Subtle opacities within the bilateral upper lobes may also be attributable to the low lung volumes in combination with superimposed osseous structures. No confluent opacity within the lower lungs to suggest a developing pneumonia or pulmonary edema. No pleural effusion or pneumothorax is seen. Osseous structures about the chest are unremarkable. IMPRESSION: Low lung volumes. Subtle opacities within the bilateral upper lobes may be crowded bronchovascular markings secondary to the low lung volumes, with superimposed osseous structures, but early developing pneumonia cannot be excluded. If possible, would consider repeat chest x-ray with better inspiratory effort. No evidence of pneumonia or pulmonary edema within the mid or lower lung zones. Electronically Signed   By: Franki Cabot M.D.   On: 05/22/2019 14:52       Today   Subjective:   [name] patient confused at baseline  Objective:   Blood pressure (!) 166/72, pulse 67, temperature 98.8 F (37.1 C), temperature source Axillary, resp. rate 20, height 5\' 6"  (1.676 m), weight 54.4 kg, SpO2 100 %.  .  Intake/Output Summary (Last 24 hours) at 05/25/2019 1155  Last data filed at 05/25/2019 0815 Gross per 24 hour  Intake 240 ml  Output 2650 ml  Net  -2410 ml    Exam VITAL SIGNS: Blood pressure (!) 166/72, pulse 67, temperature 98.8 F (37.1 C), temperature source Axillary, resp. rate 20, height 5\' 6"  (1.676 m), weight 54.4 kg, SpO2 100 %.  GENERAL:  85 y.o.-year-old patient lying in the bed with no acute distress.  EYES: Pupils equal, round, reactive to light and accommodation. No scleral icterus. Extraocular muscles intact.  HEENT: Head atraumatic, normocephalic. Oropharynx and nasopharynx clear.  NECK:  Supple, no jugular venous distention. No thyroid enlargement, no tenderness.  LUNGS: Normal breath sounds bilaterally, no wheezing, rales,rhonchi or crepitation. No use of accessory muscles of respiration.  CARDIOVASCULAR: S1, S2 normal. No murmurs, rubs, or gallops.  ABDOMEN: Soft, nontender, nondistended. Bowel sounds present. No organomegaly or mass.  EXTREMITIES: No pedal edema, cyanosis, or clubbing.  NEUROLOGIC: Confused PSYCHIATRIC: T confused  SKIN: No obvious rash, lesion, or ulcer.   Data Review     CBC w Diff:  Lab Results  Component Value Date   WBC 10.2 05/23/2019   HGB 9.3 (L) 05/23/2019   HGB 9.8 (L) 09/27/2015   HCT 29.7 (L) 05/23/2019   HCT 30.3 (L) 09/27/2015   PLT 156 05/23/2019   PLT 252 09/27/2015   LYMPHOPCT 21 05/22/2019   LYMPHOPCT 21.0 02/12/2012   MONOPCT 10 05/22/2019   MONOPCT 9.4 02/12/2012   EOSPCT 3 05/22/2019   EOSPCT 1.5 02/12/2012   BASOPCT 0 05/22/2019   BASOPCT 0.4 02/12/2012   CMP:  Lab Results  Component Value Date   NA 141 05/23/2019   NA 141 11/01/2015   NA 137 02/01/2013   K 3.9 05/23/2019   K 3.4 (L) 02/01/2013   CL 105 05/23/2019   CL 99 02/01/2013   CO2 29 05/23/2019   CO2 34 (H) 02/01/2013   BUN 29 (H) 05/23/2019   BUN 13 11/01/2015   BUN 23 (H) 02/01/2013   CREATININE 0.66 05/23/2019   CREATININE 0.79 02/03/2018   PROT 6.7 05/22/2019   PROT 7.1 09/27/2015   PROT 8.6 (H) 02/01/2013   ALBUMIN 3.4 (L) 05/22/2019   ALBUMIN 4.0 09/27/2015   ALBUMIN 4.1  02/01/2013   BILITOT 0.7 05/22/2019   BILITOT 0.2 09/27/2015   BILITOT 0.3 02/01/2013   ALKPHOS 35 (L) 05/22/2019   ALKPHOS 55 02/01/2013   AST 22 05/22/2019   AST 22 02/01/2013   ALT <5 05/22/2019   ALT 10 (L) 02/01/2013  .  Micro Results Recent Results (from the past 240 hour(s))  Urine Culture     Status: Abnormal (Preliminary result)   Collection Time: 05/22/19  1:55 PM   Specimen: Urine, Random  Result Value Ref Range Status   Specimen Description URINE, RANDOM  Final   Special Requests NONE  Final   Culture (A)  Final    >=100,000 COLONIES/mL KLEBSIELLA PNEUMONIAE 20,000 COLONIES/mL ESCHERICHIA COLI CULTURE REINCUBATED FOR BETTER GROWTH    Report Status PENDING  Incomplete   Organism ID, Bacteria KLEBSIELLA PNEUMONIAE (A)  Final      Susceptibility   Klebsiella pneumoniae - MIC*    AMPICILLIN RESISTANT Resistant     CEFAZOLIN <=4 SENSITIVE Sensitive     CEFTRIAXONE <=1 SENSITIVE Sensitive     CIPROFLOXACIN <=0.25 SENSITIVE Sensitive     GENTAMICIN <=1 SENSITIVE Sensitive     IMIPENEM <=0.25 SENSITIVE Sensitive     NITROFURANTOIN 64 INTERMEDIATE Intermediate     TRIMETH/SULFA <=  20 SENSITIVE Sensitive     AMPICILLIN/SULBACTAM 4 SENSITIVE Sensitive     PIP/TAZO <=4 SENSITIVE Sensitive     Extended ESBL Value in next row Sensitive      NEGATIVEPerformed at [facility] [address], [place], [place] [address]    * >=100,000 COLONIES/mL KLEBSIELLA PNEUMONIAE  SARS CORONAVIRUS 2 (TAT 6-24 HRS) Nasopharyngeal Nasopharyngeal Swab     Status: None   Collection Time: 05/22/19  2:30 PM   Specimen: Nasopharyngeal Swab  Result Value Ref Range Status   SARS Coronavirus 2 NEGATIVE NEGATIVE Final    Comment: (NOTE) SARS-CoV-2 target nucleic acids are NOT DETECTED. The SARS-CoV-2 RNA is generally detectable in upper and lower respiratory specimens during the acute phase of infection. Negative results do not preclude SARS-CoV-2 infection, do not rule out co-infections  with other pathogens, and should not be used as the sole basis for treatment or other patient management decisions. Negative results must be combined with clinical observations, patient history, and epidemiological information. The expected result is Negative. Fact Sheet for Patients: SugarRoll.be Fact Sheet for Healthcare Providers: https://www.woods-mathews.com/ This test is not yet approved or cleared by the [place] FDA and  has been authorized for detection and/or diagnosis of SARS-CoV-2 by FDA under an Emergency Use Authorization (EUA). This EUA will remain  in effect (meaning this test can be used) for the duration of the COVID-19 declaration under Section 56 4(b)(1) of the Act, 21 U.S.C. section 360bbb-3(b)(1), unless the authorization is terminated or revoked sooner. Performed at [facility], [place] [address], [place], [place] [address]   MRSA PCR Screening     Status: None   Collection Time: 05/25/19  8:04 AM   Specimen: Nasopharyngeal  Result Value Ref Range Status   MRSA by PCR NEGATIVE NEGATIVE Final    Comment:        The GeneXpert MRSA Assay (FDA approved for NASAL specimens only), is one component of a comprehensive MRSA colonization surveillance program. It is not intended to diagnose MRSA infection nor to guide or monitor treatment for MRSA infections. Performed at [facility], [address] [place],  [id number]         Code Status Orders  (From admission, onward)         Start     Ordered   05/22/19 1654  Do not attempt resuscitation (DNR)  Continuous    Question Answer Comment  In the event of cardiac or respiratory ARREST Do not call a "code blue"   In the event of cardiac or respiratory ARREST Do not perform Intubation, CPR, defibrillation or ACLS   In the event of cardiac or respiratory ARREST Use medication by any route, position, wound care, and other measures to relive  pain and suffering. May use oxygen, suction and manual treatment of airway obstruction as needed for comfort.      05/22/19 1654        Code Status History    Date Active Date Inactive Code Status Order ID Comments User Context   05/22/2019 [id number] 05/22/2019 1654 DNR [id number]  Merlyn Lot, MD ED   Advance Care Planning Activity          Contact information for after-discharge care    Destination    [facility] .   Service: Skilled Nursing Contact information: [address] [place] [phone]              Discharge Medications  Allergies as of 05/25/2019   No Known Allergies     Medication List    STOP taking these medications   carvedilol 6.25 MG tablet Commonly known as: COREG     TAKE these medications   acetaminophen 325 MG tablet Commonly known as: TYLENOL Take 1-2 tablets (325-650 mg total) by mouth every 8 (eight) hours as needed. for pain, fever, headache; okay to crush   amLODipine 5 MG tablet Commonly known as: NORVASC Take 1 tablet (5 mg total) by mouth daily. (may be crushed)   anastrozole 1 MG tablet Commonly known as: ARIMIDEX Take 1 tablet (1 mg total) by mouth daily.   aspirin 81 MG chewable tablet Chew 1 tablet (81 mg total) by mouth daily.   Bismuth Subsalicylate [id number] Susp Take 15 mLs by mouth every 2 (two) hours as needed (nausea).   Calcium-Vitamin D 600-400 MG-UNIT Tabs TAKE ONE TABLET BY MOUTH TWO TIMES A DAY   cefdinir 300 MG capsule Commonly known as: OMNICEF Take 1 capsule (300 mg total) by mouth every 12 (twelve) hours.   citalopram 10 MG tablet Commonly known as: CELEXA TAKE ONE TABLET BY MOUTH EVERY DAY   Combigan 0.2-0.5 % ophthalmic solution Generic drug: brimonidine-timolol Place 1 drop into both eyes 2 (two) times daily.   diclofenac 75 MG EC tablet Commonly known as: VOLTAREN Take 1 tablet (75 mg total) by mouth daily with breakfast.    divalproex 125 MG capsule Commonly known as: DEPAKOTE SPRINKLE Take 125 mg by mouth 2 (two) times a day.   fluticasone 50 MCG/ACT nasal spray Commonly known as: FLONASE USE 2 SPRAYS IN BOTH NOSTRILS ONCE DAILYFOR RHINITIS What changed: See the new instructions.   latanoprost 0.005 % ophthalmic solution Commonly known as: XALATAN Place 1 drop into both eyes at bedtime.   lisinopril 20 MG tablet Commonly known as: ZESTRIL TAKE ONE TABLET BY MOUTH EVERY DAY   Medical Compression Stockings Misc Put stockings on every morning and remove every night; do not sleep in stockings; get the kind with open toes   omeprazole 20 MG capsule Commonly known as: PRILOSEC TAKE ONE CAPSULE BY MOUTH EVERY DAY   polyethylene glycol powder 17 GM/SCOOP powder Commonly known as: GLYCOLAX/MIRALAX MIX 17 GR (1 CAPFUL) IN 8 OZ WATER/JUICEAND DRINK ONCE DAILY AS NEEDED TO FOR CONSTIPATION   pravastatin 40 MG tablet Commonly known as: PRAVACHOL TAKE ONE TABLET BY MOUTH EVERY DAY   pregabalin 25 MG capsule Commonly known as: LYRICA TAKE ONE CAPSULE BY MOUTH EVERY OTHER NIGHT *MAY OPEN AND SPRINKLE INAPPLESAUCE TO BE SWALLOWED WHOLE, BUT DO NOT CRUSH OR CHEW*   vitamin B-12 500 MCG tablet Commonly known as: CYANOCOBALAMIN TAKE ONE TABLET BY MOUTH EVERY DAY          Total Time in preparing paper work, data evaluation and todays exam - 49 minutes  Dustin Flock M.D on 05/25/2019 at 11:55 AM [place]  [phone]

## 2019-05-26 DIAGNOSIS — G9340 Encephalopathy, unspecified: Secondary | ICD-10-CM | POA: Diagnosis not present

## 2019-05-26 DIAGNOSIS — I10 Essential (primary) hypertension: Secondary | ICD-10-CM | POA: Diagnosis not present

## 2019-05-26 DIAGNOSIS — N179 Acute kidney failure, unspecified: Secondary | ICD-10-CM | POA: Diagnosis not present

## 2019-05-26 DIAGNOSIS — N390 Urinary tract infection, site not specified: Secondary | ICD-10-CM | POA: Diagnosis not present

## 2019-05-30 LAB — SUSCEPTIBILITY, AER + ANAEROB

## 2019-05-30 LAB — SUSCEPTIBILITY RESULT

## 2019-05-31 DIAGNOSIS — I10 Essential (primary) hypertension: Secondary | ICD-10-CM | POA: Diagnosis not present

## 2019-05-31 DIAGNOSIS — N179 Acute kidney failure, unspecified: Secondary | ICD-10-CM | POA: Diagnosis not present

## 2019-05-31 DIAGNOSIS — F0150 Vascular dementia without behavioral disturbance: Secondary | ICD-10-CM | POA: Diagnosis not present

## 2019-05-31 DIAGNOSIS — G9340 Encephalopathy, unspecified: Secondary | ICD-10-CM | POA: Diagnosis not present

## 2019-06-05 DIAGNOSIS — I10 Essential (primary) hypertension: Secondary | ICD-10-CM | POA: Diagnosis not present

## 2019-06-05 DIAGNOSIS — N179 Acute kidney failure, unspecified: Secondary | ICD-10-CM | POA: Diagnosis not present

## 2019-06-05 DIAGNOSIS — F0150 Vascular dementia without behavioral disturbance: Secondary | ICD-10-CM | POA: Diagnosis not present

## 2019-06-05 DIAGNOSIS — G9340 Encephalopathy, unspecified: Secondary | ICD-10-CM | POA: Diagnosis not present

## 2019-06-10 LAB — URINE CULTURE: Culture: >=100000 — AB

## 2019-06-22 DIAGNOSIS — G9340 Encephalopathy, unspecified: Secondary | ICD-10-CM | POA: Diagnosis not present

## 2019-06-22 DIAGNOSIS — I10 Essential (primary) hypertension: Secondary | ICD-10-CM | POA: Diagnosis not present

## 2019-06-22 DIAGNOSIS — I679 Cerebrovascular disease, unspecified: Secondary | ICD-10-CM | POA: Diagnosis not present

## 2019-06-29 DIAGNOSIS — Z20828 Contact with and (suspected) exposure to other viral communicable diseases: Secondary | ICD-10-CM | POA: Diagnosis not present

## 2019-07-06 DIAGNOSIS — Z20828 Contact with and (suspected) exposure to other viral communicable diseases: Secondary | ICD-10-CM | POA: Diagnosis not present

## 2019-07-13 DIAGNOSIS — Z20828 Contact with and (suspected) exposure to other viral communicable diseases: Secondary | ICD-10-CM | POA: Diagnosis not present

## 2019-07-20 DIAGNOSIS — F0150 Vascular dementia without behavioral disturbance: Secondary | ICD-10-CM | POA: Diagnosis not present

## 2019-07-20 DIAGNOSIS — I10 Essential (primary) hypertension: Secondary | ICD-10-CM | POA: Diagnosis not present

## 2019-07-20 DIAGNOSIS — Z20828 Contact with and (suspected) exposure to other viral communicable diseases: Secondary | ICD-10-CM | POA: Diagnosis not present

## 2019-07-20 DIAGNOSIS — E785 Hyperlipidemia, unspecified: Secondary | ICD-10-CM | POA: Diagnosis not present

## 2019-07-24 DIAGNOSIS — F0150 Vascular dementia without behavioral disturbance: Secondary | ICD-10-CM | POA: Diagnosis not present

## 2019-07-24 DIAGNOSIS — S21002A Unspecified open wound of left breast, initial encounter: Secondary | ICD-10-CM | POA: Diagnosis not present

## 2019-07-24 DIAGNOSIS — C50412 Malignant neoplasm of upper-outer quadrant of left female breast: Secondary | ICD-10-CM | POA: Diagnosis not present

## 2019-07-25 DIAGNOSIS — Z20828 Contact with and (suspected) exposure to other viral communicable diseases: Secondary | ICD-10-CM | POA: Diagnosis not present

## 2019-07-31 DIAGNOSIS — S21002A Unspecified open wound of left breast, initial encounter: Secondary | ICD-10-CM | POA: Diagnosis not present

## 2019-07-31 DIAGNOSIS — C50412 Malignant neoplasm of upper-outer quadrant of left female breast: Secondary | ICD-10-CM | POA: Diagnosis not present

## 2019-07-31 DIAGNOSIS — F0150 Vascular dementia without behavioral disturbance: Secondary | ICD-10-CM | POA: Diagnosis not present

## 2019-08-01 DIAGNOSIS — Z20828 Contact with and (suspected) exposure to other viral communicable diseases: Secondary | ICD-10-CM | POA: Diagnosis not present

## 2019-08-07 DIAGNOSIS — G479 Sleep disorder, unspecified: Secondary | ICD-10-CM | POA: Diagnosis not present

## 2019-08-07 DIAGNOSIS — R451 Restlessness and agitation: Secondary | ICD-10-CM | POA: Diagnosis not present

## 2019-08-07 DIAGNOSIS — S21002A Unspecified open wound of left breast, initial encounter: Secondary | ICD-10-CM | POA: Diagnosis not present

## 2019-08-08 DIAGNOSIS — Z20828 Contact with and (suspected) exposure to other viral communicable diseases: Secondary | ICD-10-CM | POA: Diagnosis not present

## 2019-09-07 ENCOUNTER — Inpatient Hospital Stay: Payer: Medicare Other | Admitting: Oncology

## 2019-09-07 ENCOUNTER — Inpatient Hospital Stay: Payer: Medicare Other

## 2020-02-27 ENCOUNTER — Inpatient Hospital Stay: Payer: Medicare Other | Attending: Oncology | Admitting: Oncology

## 2020-02-27 ENCOUNTER — Other Ambulatory Visit: Payer: Self-pay

## 2020-02-27 ENCOUNTER — Encounter (INDEPENDENT_AMBULATORY_CARE_PROVIDER_SITE_OTHER): Payer: Self-pay

## 2020-02-27 ENCOUNTER — Ambulatory Visit: Payer: Medicare Other | Admitting: Oncology

## 2020-02-27 VITALS — BP 178/117 | HR 65 | Resp 18

## 2020-02-27 DIAGNOSIS — Z170 Estrogen receptor positive status [ER+]: Secondary | ICD-10-CM | POA: Diagnosis not present

## 2020-02-27 DIAGNOSIS — Z5181 Encounter for therapeutic drug level monitoring: Secondary | ICD-10-CM | POA: Diagnosis not present

## 2020-02-27 DIAGNOSIS — Z79811 Long term (current) use of aromatase inhibitors: Secondary | ICD-10-CM | POA: Insufficient documentation

## 2020-02-27 DIAGNOSIS — C50412 Malignant neoplasm of upper-outer quadrant of left female breast: Secondary | ICD-10-CM | POA: Diagnosis present

## 2020-02-27 NOTE — Progress Notes (Signed)
Hematology/Oncology Consult note [facility]  [phone] [phone]  Patient Care Team: Mohammed Kindle, MD as Attending Physician (Pain Medicine) Anabel Bene, MD as Referring Physician (Neurology)   Name of the patient: Melissa Combs  [id number]  1929-11-21   Date of visit: 02/27/20  Diagnosis-left breast cancer ER/PR positive HER-2 negative on Arimidex.  No surgery at  Chief complaint/ Reason for visit-acute visit for concerns of enlarging left breast mass  Heme/Onc history: patient is a 86 year old female with a past medical history significant for dementia, hypertension, hyperlipidemia and chronic kidney disease among other medical problems. She has been a resident of group home since 2015 due to her gradually worsening dementia. Her only close family currently is her daughter who lives in [place] and is with her today along with her caregiver from group home. With regards to her dementia patient is able to ambulate with the help of a walker. She can carry out conversations and is not incontinent of her urine and stool. She is able to eat without any aspiration issues and overall she has been doing well at the nursing home without any recent hospitalizations. Her caregiver noticed a left breast mass recently which prompted a mammogram. Mammogram showed a 5.1 cm mass in the 2 o'clock position of the left breast. No evidence of axillary adenopathy. No evidence of breast masses in the right breast. Patient has had a prior breast biopsy and surgery in the 1970s which the daughter sayswas not malignancy. No other family history of breast cancer, ovarian cancer, colon or prostate cancer. There is a family history of malignant hyperthermia with nitrous oxide.  Patient is currently pleasantly confused. She is not aware about her underlying medical condition and history is obtained as above with the help of her daughter and  caregiver. Overall her appetite is good and she has not had any unintentional weight loss. She does not complain of any pain today  Patient underwent core biopsy which showed invasive mammary carcinoma, grade 2, ER greater than 90% positive, PR 1% positive and HER-2 equivocal by IHC.FISH negative  Patient was seen by Dr. Melburn Hake surgery and given family history of malignant hyperthermia along with patient age and dementia she was not deemed to be a surgical candidate.arimidex started on 04/14/18   Interval history- she is pleasantly confused. Denies any complaints at this time. Caregiver from nursing home reports occasional discharge from left breast mass  ECOG PS- 3   Review of systems- Review of Systems  Unable to perform ROS: Dementia      No Known Allergies   Past Medical History:  Diagnosis Date   Allergy    Anemia    Arthritis    B12 deficiency 11/06/2015   Less than 400 October 2016   Chronic constipation    Dementia ([place])    Dementia (HCC)    Family history of adverse reaction to anesthesia    brother, daughter and grandson had malignant hyperthermia in the past   Glaucoma    History of fibrocystic disease of breast    biopsy done, negative   History of hand fracture    left   History of shingles 2014   History of wrist fracture    left   Hypertension    Impacted cerumen    Malignant hyperthermia    Pneumonia    Postherpetic neuralgia    TIA (transient ischemic attack) Feb 2016     Past Surgical History:  Procedure  Laterality Date   BREAST BIOPSY Left 03/24/2018   path pending   BREAST LUMPECTOMY     unsure which side   CATARACT EXTRACTION W/PHACO Right 01/12/2017   Procedure: CATARACT EXTRACTION PHACO AND INTRAOCULAR LENS PLACEMENT (IOC);  Surgeon: Birder Robson, MD;  Location: [facility] ORS;  Service: Ophthalmology;  Laterality: Right;  [place] 1:05.8 AP% 26.0 CDE 17.07 Fluid pack lot # [id number]    ESOPHAGOGASTRODUODENOSCOPY (EGD) WITH PROPOFOL N/A 02/10/2018   Procedure: ESOPHAGOGASTRODUODENOSCOPY (EGD) WITH PROPOFOL;  Surgeon: Lin Landsman, MD;  Location: [facility];  Service: Gastroenterology;  Laterality: N/A;    Social History   Socioeconomic History   Marital status: Single    Spouse name: Not on file   Number of children: Not on file   Years of education: Not on file   Highest education level: Not on file  Occupational History   Not on file  Tobacco Use   Smoking status: Never Smoker   Smokeless tobacco: Never Used  Vaping Use   Vaping Use: Never used  Substance and Sexual Activity   Alcohol use: No    Alcohol/week: 0.0 standard drinks   Drug use: No   Sexual activity: Never  Other Topics Concern   Not on file  Social History Narrative   Not on file   Social Determinants of Health   Financial Resource Strain:    Difficulty of Paying Living Expenses:   Food Insecurity:    Worried About Charity fundraiser in the Last Year:    Arboriculturist in the Last Year:   Transportation Needs:    Film/video editor (Medical):    Lack of Transportation (Non-Medical):   Physical Activity:    Days of Exercise per Week:    Minutes of Exercise per Session:   Stress:    Feeling of Stress :   Social Connections:    Frequency of Communication with Friends and Family:    Frequency of Social Gatherings with Friends and Family:    Attends Religious Services:    Active Member of Clubs or Organizations:    Attends Music therapist:    Marital Status:   Intimate Partner Violence:    Fear of Current or Ex-Partner:    Emotionally Abused:    Physically Abused:    Sexually Abused:     Family History  Family history unknown: Yes     Current Outpatient Medications:    acetaminophen (TYLENOL) 325 MG tablet, Take 1-2 tablets (325-650 mg total) by mouth every 8 (eight) hours as needed. for pain, fever, headache; okay  to crush, Disp: 100 tablet, Rfl: 1   amLODipine (NORVASC) 5 MG tablet, Take 1 tablet (5 mg total) by mouth daily. (may be crushed), Disp: 30 tablet, Rfl: 5   anastrozole (ARIMIDEX) 1 MG tablet, Take 1 tablet (1 mg total) by mouth daily., Disp: 30 tablet, Rfl: 3   aspirin 81 MG chewable tablet, Chew 1 tablet (81 mg total) by mouth daily., Disp: 30 tablet, Rfl: 5   Bismuth Subsalicylate 371 GG/26RS SUSP, Take 15 mLs by mouth every 2 (two) hours as needed (nausea). , Disp: , Rfl:    brimonidine-timolol (COMBIGAN) 0.2-0.5 % ophthalmic solution, Place 1 drop into both eyes 2 (two) times daily., Disp: , Rfl:    Calcium Carb-Cholecalciferol (CALCIUM-VITAMIN D) 600-400 MG-UNIT TABS, TAKE ONE TABLET BY MOUTH TWO TIMES A DAY, Disp: 58 tablet, Rfl: 0   cefdinir (OMNICEF) 300 MG capsule, Take 1 capsule (300 mg total)  by mouth every 12 (twelve) hours., Disp: 5 capsule, Rfl: 0   citalopram (CELEXA) 10 MG tablet, TAKE ONE TABLET BY MOUTH EVERY DAY, Disp: 30 tablet, Rfl: 5   diclofenac (VOLTAREN) 75 MG EC tablet, Take 1 tablet (75 mg total) by mouth daily with breakfast., Disp: 90 tablet, Rfl: 0   divalproex (DEPAKOTE SPRINKLE) 125 MG capsule, Take 125 mg by mouth 2 (two) times a day., Disp: , Rfl:    Elastic Bandages & Supports (MEDICAL COMPRESSION STOCKINGS) MISC, Put stockings on every morning and remove every night; do not sleep in stockings; get the kind with open toes, Disp: 2 each, Rfl: 0   fluticasone (FLONASE) 50 MCG/ACT nasal spray, USE 2 SPRAYS IN BOTH NOSTRILS ONCE DAILYFOR RHINITIS (Patient taking differently: Place 2 sprays into both nostrils daily as needed for allergies. ), Disp: 16 g, Rfl: 11   latanoprost (XALATAN) 0.005 % ophthalmic solution, Place 1 drop into both eyes at bedtime., Disp: , Rfl:    lisinopril (PRINIVIL,ZESTRIL) 20 MG tablet, TAKE ONE TABLET BY MOUTH EVERY DAY, Disp: 30 tablet, Rfl: 5   omeprazole (PRILOSEC) 20 MG capsule, TAKE ONE CAPSULE BY MOUTH EVERY DAY, Disp: 30  capsule, Rfl: 2   polyethylene glycol powder (GLYCOLAX/MIRALAX) powder, MIX 17 GR (1 CAPFUL) IN 8 OZ WATER/JUICEAND DRINK ONCE DAILY AS NEEDED TO FOR CONSTIPATION, Disp: 527 g, Rfl: 11   pravastatin (PRAVACHOL) 40 MG tablet, TAKE ONE TABLET BY MOUTH EVERY DAY, Disp: 30 tablet, Rfl: 5   pregabalin (LYRICA) 25 MG capsule, TAKE ONE CAPSULE BY MOUTH EVERY OTHER NIGHT *MAY OPEN AND SPRINKLE INAPPLESAUCE TO BE SWALLOWED WHOLE, BUT DO NOT CRUSH OR CHEW*, Disp: 28 capsule, Rfl: 0   vitamin B-12 (CYANOCOBALAMIN) 500 MCG tablet, TAKE ONE TABLET BY MOUTH EVERY DAY, Disp: 30 tablet, Rfl: 5  Physical exam: There were no vitals filed for this visit. Physical Exam Constitutional:      Comments: Sitting in a wheelchair. Resists exam  Cardiovascular:     Rate and Rhythm: Normal rate.  Pulmonary:     Effort: Pulmonary effort is normal.  Abdominal:     General: Bowel sounds are normal.     Palpations: Abdomen is soft.  Skin:    General: Skin is warm and dry.  Neurological:     Mental Status: She is alert and oriented to person, place, and time.    palpable left breast mass about 5 cm in size. However exam is limited as patient cannot sit or lay down on examination table. Adhesive dressing over skin opening   CMP Latest Ref Rng & Units 05/23/2019  Glucose 70 - 99 mg/dL 84  BUN 8 - 23 mg/dL 29(H)  Creatinine 0.44 - 1.00 mg/dL 0.66  Sodium 135 - 145 mmol/L 141  Potassium 3.5 - 5.1 mmol/L 3.9  Chloride 98 - 111 mmol/L 105  CO2 22 - 32 mmol/L 29  Calcium 8.9 - 10.3 mg/dL 8.7(L)  Total Protein 6.5 - 8.1 g/dL -  Total Bilirubin 0.3 - 1.2 mg/dL -  Alkaline Phos 38 - 126 U/L -  AST 15 - 41 U/L -  ALT 0 - 44 U/L -   CBC Latest Ref Rng & Units 05/23/2019  WBC 4.0 - 10.5 K/uL 10.2  Hemoglobin 12.0 - 15.0 g/dL 9.3(L)  Hematocrit 36 - 46 % 29.7(L)  Platelets 150 - 400 K/uL 156      Assessment and plan- Patient is a 86 y.o. female invasive mammary carcinoma of the left breast clinically prognostic  stage II aT3 N0 M0 ER 90% positive, PR 1% positive and HER-2/neu negative.She was not deemed to be a surgical candidate and is currently on Arimidex.This is a f/u visit of breast cancer  Patient last saw me in July 2020. Follow up subsequently was disrupted due to patient having covid and then moving to [place] home.  Presently there is a concern if her left breast mass is eroding through her skin. Patient resists exam today. There is an adhesive dressing over the wound which appears small with scant discharge. I spoke to patient daughter and plan is to proceed with usg left breast. She is not a candidate for surgery  If arimidex is not controlling her breast mass, consideration may be given to CDKi.  I will get in touch with her daughter after usg is back. Will see her back in 6 months   Visit Diagnosis 1. Malignant neoplasm of upper-outer quadrant of left breast in female, estrogen receptor positive ([place])   2. Visit for monitoring Arimidex therapy      Dr. Randa Evens, MD, MPH [facility] [phone] 02/27/2020 12:51 PM

## 2020-02-27 NOTE — Progress Notes (Signed)
Patient here for oncology follow-up appointment, patient yelling and abusive to staff when obtaining vitals. CNA from facility was able to assist with vitals. O2 82% patient would not keep oxygen on. CNA assisted with questions, states patient was nauseated all day.

## 2020-02-28 ENCOUNTER — Other Ambulatory Visit: Payer: Self-pay

## 2020-02-28 DIAGNOSIS — C50912 Malignant neoplasm of unspecified site of left female breast: Secondary | ICD-10-CM

## 2020-02-29 ENCOUNTER — Encounter: Payer: Self-pay | Admitting: Oncology

## 2020-03-13 ENCOUNTER — Ambulatory Visit: Admission: RE | Admit: 2020-03-13 | Payer: Medicare Other | Source: Ambulatory Visit

## 2020-03-13 ENCOUNTER — Other Ambulatory Visit: Payer: Self-pay

## 2020-04-29 ENCOUNTER — Ambulatory Visit
Admission: RE | Admit: 2020-04-29 | Discharge: 2020-04-29 | Disposition: A | Discharge status: Home / Self Care | Payer: Medicare Other | Source: Ambulatory Visit | Attending: Oncology | Admitting: Oncology

## 2020-04-29 ENCOUNTER — Other Ambulatory Visit: Payer: Self-pay

## 2020-04-29 DIAGNOSIS — Z171 Estrogen receptor negative status [ER-]: Secondary | ICD-10-CM

## 2020-07-31 ENCOUNTER — Telehealth: Payer: Self-pay | Admitting: *Deleted

## 2020-07-31 NOTE — Telephone Encounter (Signed)
[place] from the facility where patient resides asking about decision regarding treatment post [place] stating that the daughter states that our office and her have been playing phone tag. The wound care doctor is asking if patient is going to get treatment or not. Please advise

## 2020-07-31 NOTE — Telephone Encounter (Signed)
Returned call to [place] at [facility]. She states that they are wanting [place] to reschedule the ultrasound for pt. I told [place] that it had been scheduled twice and the first time someone called and cancelled it and the second time the patient came to the appointment but she was not with anyone and the staff cancelled it because pt can't give name and date of birth. [place] said there will be someone with her and she wonders if pt should get ativan to take before she gets there so that she can possibly be able to get the ultrasound.. I told her that I will ask Dr. Janese Banks and let her know

## 2020-08-01 ENCOUNTER — Other Ambulatory Visit: Payer: Self-pay | Admitting: *Deleted

## 2020-08-01 DIAGNOSIS — C50912 Malignant neoplasm of unspecified site of left female breast: Secondary | ICD-10-CM

## 2020-08-06 ENCOUNTER — Other Ambulatory Visit: Payer: Self-pay | Admitting: *Deleted

## 2020-08-06 DIAGNOSIS — Z171 Estrogen receptor negative status [ER-]: Secondary | ICD-10-CM

## 2020-08-06 DIAGNOSIS — C50912 Malignant neoplasm of unspecified site of left female breast: Secondary | ICD-10-CM

## 2020-08-06 DIAGNOSIS — Z853 Personal history of malignant neoplasm of breast: Secondary | ICD-10-CM

## 2020-08-06 NOTE — Progress Notes (Unsigned)
[id number]

## 2020-08-07 ENCOUNTER — Telehealth: Payer: Self-pay | Admitting: *Deleted

## 2020-08-07 NOTE — Telephone Encounter (Signed)
Called family member Adonis Brook her daughter to let her know that [place] from [facility] had contacted the office requesting to set the u/s of breast. It had been scheduled and then it was cancelled and then pt was brought to the appt. But no one was with her and so because she could not tell the staff name and date of birth- they could not do the exam.  Also with her dementia when you take her out of her environment sometimes she is anxious. The facility suggested to have pt take a xanax or ativan. I asked the daughter and she states that she has no knowledge of her having either tothese meds . She believes that her taking one of those is a good decision to help pt be calm so that hopefully we will get answers about her cancer status. I told her that I will call and speak to staff and let them know the plan. I called [facility] but the person in charge in not there today and wants me to call back later. I will try to reach them tomorrow.

## 2020-08-08 ENCOUNTER — Other Ambulatory Visit: Payer: Self-pay | Admitting: *Deleted

## 2020-08-08 ENCOUNTER — Telehealth: Payer: Self-pay | Admitting: *Deleted

## 2020-08-08 MED ORDER — LORAZEPAM 0.5 MG PO TABS: 0.5000 mg | ORAL_TABLET | Freq: Once | ORAL | 0 refills | Status: AC

## 2020-08-08 NOTE — Telephone Encounter (Signed)
Called facility and spoke to DON-Bridgette and told her that I had been talking to [place] there and we were trying to get her set up for u/s of breast. The appt is 12/17 an arrival at 9 am. The staff at mammography  Is going to try mammogram also because she has not had one in few years. Also I told her that someone will need to be with her at this visit because she does not usually know who name and DOB due to dementia. We have also sent in rx for ativan to take 45 min before she comes to visit. I gave the address and then phone number to mammography in case they have issues getting her. Bridgette will make sure it gets done

## 2020-08-16 ENCOUNTER — Other Ambulatory Visit: Payer: Medicare Other

## 2020-09-03 ENCOUNTER — Inpatient Hospital Stay: Payer: Medicare Other | Attending: Oncology

## 2020-09-03 ENCOUNTER — Other Ambulatory Visit: Payer: Self-pay | Admitting: *Deleted

## 2020-09-03 ENCOUNTER — Inpatient Hospital Stay (HOSPITAL_BASED_OUTPATIENT_CLINIC_OR_DEPARTMENT_OTHER): Payer: Medicare Other | Admitting: Oncology

## 2020-09-03 ENCOUNTER — Encounter: Payer: Self-pay | Admitting: Oncology

## 2020-09-03 VITALS — BP 118/70 | HR 55 | Temp 96.5°F | Resp 16

## 2020-09-03 DIAGNOSIS — Z170 Estrogen receptor positive status [ER+]: Secondary | ICD-10-CM | POA: Diagnosis not present

## 2020-09-03 DIAGNOSIS — Z79811 Long term (current) use of aromatase inhibitors: Secondary | ICD-10-CM | POA: Diagnosis not present

## 2020-09-03 DIAGNOSIS — C50412 Malignant neoplasm of upper-outer quadrant of left female breast: Secondary | ICD-10-CM | POA: Insufficient documentation

## 2020-09-03 DIAGNOSIS — Z5181 Encounter for therapeutic drug level monitoring: Secondary | ICD-10-CM

## 2020-09-03 DIAGNOSIS — C50912 Malignant neoplasm of unspecified site of left female breast: Secondary | ICD-10-CM

## 2020-09-03 DIAGNOSIS — F0390 Unspecified dementia without behavioral disturbance: Secondary | ICD-10-CM | POA: Diagnosis not present

## 2020-09-03 DIAGNOSIS — Z780 Asymptomatic menopausal state: Secondary | ICD-10-CM

## 2020-09-03 DIAGNOSIS — Z171 Estrogen receptor negative status [ER-]: Secondary | ICD-10-CM

## 2020-09-03 DIAGNOSIS — M8580 Other specified disorders of bone density and structure, unspecified site: Secondary | ICD-10-CM

## 2020-09-03 LAB — CBC WITH DIFFERENTIAL/PLATELET
Abs Immature Granulocytes: 0.05 10*3/uL (ref 0.00–0.07)
Basophils Absolute: 0.1 10*3/uL (ref 0.0–0.1)
Basophils Relative: 0 %
Eosinophils Absolute: 0.3 10*3/uL (ref 0.0–0.5)
Eosinophils Relative: 2 %
HCT: 35.8 % — ABNORMAL LOW (ref 36.0–46.0)
Hemoglobin: 11.3 g/dL — ABNORMAL LOW (ref 12.0–15.0)
Immature Granulocytes: 0 %
Lymphocytes Relative: 27 %
Lymphs Abs: 3.1 10*3/uL (ref 0.7–4.0)
MCH: 28.6 pg (ref 26.0–34.0)
MCHC: 31.6 g/dL (ref 30.0–36.0)
MCV: 90.6 fL (ref 80.0–100.0)
Monocytes Absolute: 0.8 10*3/uL (ref 0.1–1.0)
Monocytes Relative: 7 %
Neutro Abs: 7.1 10*3/uL (ref 1.7–7.7)
Neutrophils Relative %: 64 %
Platelets: 472 10*3/uL — ABNORMAL HIGH (ref 150–400)
RBC: 3.95 MIL/uL (ref 3.87–5.11)
RDW: 13.7 % (ref 11.5–15.5)
WBC: 11.4 10*3/uL — ABNORMAL HIGH (ref 4.0–10.5)
nRBC: 0 % (ref 0.0–0.2)

## 2020-09-03 LAB — COMPREHENSIVE METABOLIC PANEL
ALT: 6 U/L (ref 0–44)
AST: 18 U/L (ref 15–41)
Albumin: 3.4 g/dL — ABNORMAL LOW (ref 3.5–5.0)
Alkaline Phosphatase: 45 U/L (ref 38–126)
Anion gap: 11 (ref 5–15)
BUN: 24 mg/dL — ABNORMAL HIGH (ref 8–23)
CO2: 27 mmol/L (ref 22–32)
Calcium: 9 mg/dL (ref 8.9–10.3)
Chloride: 102 mmol/L (ref 98–111)
Creatinine, Ser: 0.6 mg/dL (ref 0.44–1.00)
GFR, Estimated: >60 mL/min (ref >60)
Glucose, Bld: 107 mg/dL — ABNORMAL HIGH (ref 70–99)
Potassium: 3.6 mmol/L (ref 3.5–5.1)
Sodium: 140 mmol/L (ref 135–145)
Total Bilirubin: 0.4 mg/dL (ref 0.3–1.2)
Total Protein: 7.1 g/dL (ref 6.5–8.1)

## 2020-09-03 NOTE — Progress Notes (Signed)
Hematology/Oncology Consult note [facility]  Telephone:([phone] [phone]  Patient Care Team: Patient, No Pcp Per as PCP - General (General Practice) Mohammed Kindle, MD as Attending Physician (Pain Medicine) Anabel Bene, MD as Referring Physician (Neurology)   Name of the patient: Melissa Combs  [id number]  Jul 19, 1930   Date of visit: 09/03/20  Diagnosis- left breast cancer ER/PR positive HER-2 negative on Arimidex.   Chief complaint/ Reason for visit-routine follow-up of left breast cancer  Heme/Onc history: patient is a 87 year old female with a past medical history significant for dementia, hypertension, hyperlipidemia and chronic kidney disease among other medical problems. She has been a resident of group home since 2015 due to her gradually worsening dementia. Her only close family currently is her daughter who lives in [place] and is with her today along with her caregiver from group home. With regards to her dementia patient is able to ambulate with the help of a walker. She can carry out conversations and is not incontinent of her urine and stool. She is able to eat without any aspiration issues and overall she has been doing well at the nursing home without any recent hospitalizations. Her caregiver noticed a left breast mass recently which prompted a mammogram. Mammogram showed a 5.1 cm mass in the 2 o'clock position of the left breast. No evidence of axillary adenopathy. No evidence of breast masses in the right breast. Patient has had a prior breast biopsy and surgery in the 1970s which the daughter sayswas not malignancy. No other family history of breast cancer, ovarian cancer, colon or prostate cancer. There is a family history of malignant hyperthermia with nitrous oxide.  Patient is currently pleasantly confused. She is not aware about her underlying medical condition and history is obtained as above with  the help of her daughter and caregiver. Overall her appetite is good and she has not had any unintentional weight loss. She does not complain of any pain today  Patient underwent core biopsy which showed invasive mammary carcinoma, grade 2, ER greater than 90% positive, PR 1% positive and HER-2 equivocal by IHC.FISH negative  Patient was seen by Dr. Melburn Hake surgery and given family history of malignant hyperthermia along with patient age and dementia she was not deemed to be a surgical candidate.arimidex started on 04/14/18   Interval history-patient is [facility] well at the [facility].  She is here with her caregiver.  No recent hospitalizations.  No recent falls.  Caregiver reports that she has been taking her Arimidex consistently.  ECOG PS- 3  Review of systems- Review of Systems  Unable to perform ROS: Dementia      No Known Allergies   Past Medical History:  Diagnosis Date  . Allergy   . Anemia   . Arthritis   . B12 deficiency 11/06/2015   Less than 400 October 2016  . Chronic constipation   . Dementia ([place])   . Dementia ([place])   . Family history of adverse reaction to anesthesia    brother, daughter and grandson had malignant hyperthermia in the past  . Glaucoma   . History of fibrocystic disease of breast    biopsy done, negative  . History of hand fracture    left  . History of shingles 2014  . History of wrist fracture    left  . Hypertension   . Impacted cerumen   . Malignant hyperthermia   . Pneumonia   . Postherpetic neuralgia   .  TIA (transient ischemic attack) Feb 2016     Past Surgical History:  Procedure Laterality Date  . BREAST BIOPSY Left 03/24/2018   path pending  . BREAST LUMPECTOMY     unsure which side  . CATARACT EXTRACTION W/PHACO Right 01/12/2017   Procedure: CATARACT EXTRACTION PHACO AND INTRAOCULAR LENS PLACEMENT (IOC);  Surgeon: Birder Robson, MD;  Location: [facility];  Service: Ophthalmology;  Laterality: Right;   [place] 1:05.8 AP% 26.0 CDE 17.07 Fluid pack lot # [id number]  . ESOPHAGOGASTRODUODENOSCOPY (EGD) WITH PROPOFOL N/A 02/10/2018   Procedure: ESOPHAGOGASTRODUODENOSCOPY (EGD) WITH PROPOFOL;  Surgeon: Lin Landsman, MD;  Location: [facility];  Service: Gastroenterology;  Laterality: N/A;    Social History   Socioeconomic History  . Marital status: Single    Spouse name: Not on file  . Number of children: Not on file  . Years of education: Not on file  . Highest education level: Not on file  Occupational History  . Not on file  Tobacco Use  . Smoking status: Never Smoker  . Smokeless tobacco: Never Used  Vaping Use  . Vaping Use: Never used  Substance and Sexual Activity  . Alcohol use: No    Alcohol/week: 0.0 standard drinks  . Drug use: No  . Sexual activity: Never  Other Topics Concern  . Not on file  Social History Narrative  . Not on file   Social Determinants of Health   Financial Resource Strain: Not on file  Food Insecurity: Not on file  Transportation Needs: Not on file  Physical Activity: Not on file  Stress: Not on file  Social Connections: Not on file  Intimate Partner Violence: Not on file    Family History  Family history unknown: Yes     Current Outpatient Medications:  .  acetaminophen (TYLENOL) 325 MG tablet, Take 1-2 tablets (325-650 mg total) by mouth every 8 (eight) hours as needed. for pain, fever, headache; okay to crush, Disp: 100 tablet, Rfl: 1 .  amLODipine (NORVASC) 5 MG tablet, Take 1 tablet (5 mg total) by mouth daily. (may be crushed), Disp: 30 tablet, Rfl: 5 .  anastrozole (ARIMIDEX) 1 MG tablet, Take 1 tablet (1 mg total) by mouth daily., Disp: 30 tablet, Rfl: 3 .  aspirin 81 MG chewable tablet, Chew 1 tablet (81 mg total) by mouth daily., Disp: 30 tablet, Rfl: 5 .  Bismuth Subsalicylate 563 OV/56EP SUSP, Take 15 mLs by mouth every 2 (two) hours as needed (nausea). , Disp: , Rfl:  .  brimonidine-timolol (COMBIGAN) 0.2-0.5 %  ophthalmic solution, Place 1 drop into both eyes 2 (two) times daily. (Patient not taking: Reported on 02/27/2020), Disp: , Rfl:  .  Calcium Carb-Cholecalciferol (CALCIUM-VITAMIN D) 600-400 MG-UNIT TABS, TAKE ONE TABLET BY MOUTH TWO TIMES A DAY, Disp: 58 tablet, Rfl: 0 .  cefdinir (OMNICEF) 300 MG capsule, Take 1 capsule (300 mg total) by mouth every 12 (twelve) hours. (Patient not taking: Reported on 02/27/2020), Disp: 5 capsule, Rfl: 0 .  citalopram (CELEXA) 10 MG tablet, TAKE ONE TABLET BY MOUTH EVERY DAY, Disp: 30 tablet, Rfl: 5 .  diclofenac (VOLTAREN) 75 MG EC tablet, Take 1 tablet (75 mg total) by mouth daily with breakfast., Disp: 90 tablet, Rfl: 0 .  divalproex (DEPAKOTE SPRINKLE) 125 MG capsule, Take 125 mg by mouth 2 (two) times a day. (Patient not taking: Reported on 02/27/2020), Disp: , Rfl:  .  Elastic Bandages & Supports (MEDICAL COMPRESSION STOCKINGS) MISC, Put stockings on every morning and remove every  night; do not sleep in stockings; get the kind with open toes, Disp: 2 each, Rfl: 0 .  fluticasone (FLONASE) 50 MCG/ACT nasal spray, USE 2 SPRAYS IN BOTH NOSTRILS ONCE DAILYFOR RHINITIS (Patient taking differently: Place 2 sprays into both nostrils daily as needed for allergies. ), Disp: 16 g, Rfl: 11 .  latanoprost (XALATAN) 0.005 % ophthalmic solution, Place 1 drop into both eyes at bedtime., Disp: , Rfl:  .  lisinopril (PRINIVIL,ZESTRIL) 20 MG tablet, TAKE ONE TABLET BY MOUTH EVERY DAY, Disp: 30 tablet, Rfl: 5 .  omeprazole (PRILOSEC) 20 MG capsule, TAKE ONE CAPSULE BY MOUTH EVERY DAY, Disp: 30 capsule, Rfl: 2 .  polyethylene glycol powder (GLYCOLAX/MIRALAX) powder, MIX 17 GR (1 CAPFUL) IN 8 OZ WATER/JUICEAND DRINK ONCE DAILY AS NEEDED TO FOR CONSTIPATION, Disp: 527 g, Rfl: 11 .  pravastatin (PRAVACHOL) 40 MG tablet, TAKE ONE TABLET BY MOUTH EVERY DAY (Patient not taking: Reported on 02/27/2020), Disp: 30 tablet, Rfl: 5 .  pregabalin (LYRICA) 25 MG capsule, TAKE ONE CAPSULE BY MOUTH EVERY  OTHER NIGHT *MAY OPEN AND SPRINKLE INAPPLESAUCE TO BE SWALLOWED WHOLE, BUT DO NOT CRUSH OR CHEW*, Disp: 28 capsule, Rfl: 0 .  vitamin B-12 (CYANOCOBALAMIN) 500 MCG tablet, TAKE ONE TABLET BY MOUTH EVERY DAY, Disp: 30 tablet, Rfl: 5  Physical exam:  Vitals:   09/03/20 1057  BP: 118/70  Pulse: (!) 55  Resp: 16  Temp: (!) 96.5 F (35.8 C)  TempSrc: Tympanic  SpO2: 98%   Physical Exam Constitutional:      General: She is in acute distress.     Comments: Sitting in a wheelchair.  She has dementia and unable to give any history  Eyes:     Extraocular Movements: EOM normal.  Cardiovascular:     Rate and Rhythm: Normal rate and regular rhythm.     Heart sounds: Normal heart sounds.  Pulmonary:     Effort: Pulmonary effort is normal.     Breath sounds: Normal breath sounds.  Abdominal:     General: Bowel sounds are normal.     Palpations: Abdomen is soft.  Skin:    General: Skin is warm and dry.  Neurological:     Mental Status: She is alert.   Limited breast exam as patient unable to sit up on the examination table and lay down.  She resists breast examination.  All I could see were superficial ulcerated areas involving the areola without any significant discharge or open wounds.  There is a large ill-defined left breast mass.  Axilla could not be examined  CMP Latest Ref Rng & Units 09/03/2020  Glucose 70 - 99 mg/dL 107(H)  BUN 8 - 23 mg/dL 24(H)  Creatinine 0.44 - 1.00 mg/dL 0.60  Sodium 135 - 145 mmol/L 140  Potassium 3.5 - 5.1 mmol/L 3.6  Chloride 98 - 111 mmol/L 102  CO2 22 - 32 mmol/L 27  Calcium 8.9 - 10.3 mg/dL 9.0  Total Protein 6.5 - 8.1 g/dL 7.1  Total Bilirubin 0.3 - 1.2 mg/dL 0.4  Alkaline Phos 38 - 126 U/L 45  AST 15 - 41 U/L 18  ALT 0 - 44 U/L 6   CBC Latest Ref Rng & Units 09/03/2020  WBC 4.0 - 10.5 K/uL 11.4(H)  Hemoglobin 12.0 - 15.0 g/dL 11.3(L)  Hematocrit 36.0 - 46.0 % 35.8(L)  Platelets 150 - 400 K/uL 472(H)     Assessment and plan- Patient is a 87  y.o. female invasive mammary carcinoma of the left breast  clinically prognostic stage II aT3 N0 M0 ER 90% positive, PR 1% positive and HER-2/neu negative.She was not deemed to be a surgical candidate and is currently on Arimidex.  This is a routine follow-up visit for breast cancer  Patient has been on Arimidex for about 2 years now and there appears to be a slow growth in her left breast mass.  However given her underlying dementia she is not a candidate for surgery.  I am not inclined to change her treatment for breast cancer just yet as long as there is no significant progression of her disease.  I am awaiting a repeat breast ultrasound which is going to be done in 2 days.  I will update patient's daughter after those results are back.  I will see the patient back in 6 months with a bone density scan prior   Visit Diagnosis 1. Visit for monitoring Arimidex therapy   2. Malignant neoplasm of upper-outer quadrant of left breast in female, estrogen receptor positive ([place])      Dr. Randa Evens, MD, MPH [facility] [phone] 09/03/2020 12:24 PM

## 2020-09-05 ENCOUNTER — Ambulatory Visit
Admission: RE | Admit: 2020-09-05 | Discharge: 2020-09-05 | Disposition: A | Discharge status: Home / Self Care | Payer: Medicare Other | Source: Ambulatory Visit | Attending: Oncology | Admitting: Oncology

## 2020-09-05 ENCOUNTER — Other Ambulatory Visit: Payer: Self-pay

## 2020-09-05 DIAGNOSIS — Z171 Estrogen receptor negative status [ER-]: Secondary | ICD-10-CM | POA: Insufficient documentation

## 2020-09-05 DIAGNOSIS — C50912 Malignant neoplasm of unspecified site of left female breast: Secondary | ICD-10-CM | POA: Diagnosis not present

## 2020-09-05 DIAGNOSIS — Z853 Personal history of malignant neoplasm of breast: Secondary | ICD-10-CM | POA: Insufficient documentation

## 2021-02-18 ENCOUNTER — Other Ambulatory Visit: Payer: Medicare Other

## 2021-03-04 ENCOUNTER — Inpatient Hospital Stay: Payer: Medicare Other | Attending: Nurse Practitioner | Admitting: Nurse Practitioner

## 2021-05-01 ENCOUNTER — Encounter: Payer: Self-pay | Admitting: *Deleted

## 2021-05-01 NOTE — Progress Notes (Signed)
Daughter emailed me in regards to her mom's appointments.  Patient had no-showed.  New appointments made and emailed Christine, patient's daughter with those dates and times.

## 2021-05-22 ENCOUNTER — Other Ambulatory Visit: Payer: Medicare Other

## 2021-05-26 ENCOUNTER — Ambulatory Visit: Payer: Medicare Other | Admitting: Nurse Practitioner
